# Patient Record
Sex: Female | Born: 1945 | Race: White | Hispanic: No | Smoking: Former smoker
Health system: Southern US, Community
[De-identification: ages and names within clinical notes are randomized; demographics above are authoritative.]

## PROBLEM LIST (undated history)

## (undated) DIAGNOSIS — C801 Malignant (primary) neoplasm, unspecified: Secondary | ICD-10-CM

## (undated) DIAGNOSIS — D0510 Intraductal carcinoma in situ of unspecified breast: Secondary | ICD-10-CM

## (undated) DIAGNOSIS — F329 Major depressive disorder, single episode, unspecified: Secondary | ICD-10-CM

## (undated) DIAGNOSIS — M8580 Other specified disorders of bone density and structure, unspecified site: Secondary | ICD-10-CM

## (undated) DIAGNOSIS — Z923 Personal history of irradiation: Secondary | ICD-10-CM

## (undated) DIAGNOSIS — H269 Unspecified cataract: Secondary | ICD-10-CM

## (undated) DIAGNOSIS — I10 Essential (primary) hypertension: Secondary | ICD-10-CM

## (undated) DIAGNOSIS — IMO0001 Reserved for inherently not codable concepts without codable children: Secondary | ICD-10-CM

## (undated) DIAGNOSIS — T7840XA Allergy, unspecified, initial encounter: Secondary | ICD-10-CM

## (undated) DIAGNOSIS — C50919 Malignant neoplasm of unspecified site of unspecified female breast: Secondary | ICD-10-CM

## (undated) DIAGNOSIS — R011 Cardiac murmur, unspecified: Secondary | ICD-10-CM

## (undated) DIAGNOSIS — F419 Anxiety disorder, unspecified: Secondary | ICD-10-CM

## (undated) DIAGNOSIS — F32A Depression, unspecified: Secondary | ICD-10-CM

## (undated) DIAGNOSIS — E785 Hyperlipidemia, unspecified: Secondary | ICD-10-CM

## (undated) HISTORY — DX: Intraductal carcinoma in situ of unspecified breast: D05.10

## (undated) HISTORY — DX: Allergy, unspecified, initial encounter: T78.40XA

## (undated) HISTORY — DX: Essential (primary) hypertension: I10

## (undated) HISTORY — DX: Hyperlipidemia, unspecified: E78.5

## (undated) HISTORY — PX: EYE SURGERY: SHX253

## (undated) HISTORY — DX: Unspecified cataract: H26.9

## (undated) HISTORY — DX: Malignant neoplasm of unspecified site of unspecified female breast: C50.919

## (undated) HISTORY — DX: Other specified disorders of bone density and structure, unspecified site: M85.80

---

## 1994-12-17 DIAGNOSIS — C50919 Malignant neoplasm of unspecified site of unspecified female breast: Secondary | ICD-10-CM

## 1994-12-17 HISTORY — DX: Malignant neoplasm of unspecified site of unspecified female breast: C50.919

## 1996-12-17 HISTORY — PX: BREAST SURGERY: SHX581

## 2001-01-07 ENCOUNTER — Other Ambulatory Visit: Admission: RE | Admit: 2001-01-07 | Discharge: 2001-01-07 | Payer: Self-pay | Admitting: Family Medicine

## 2002-01-17 HISTORY — PX: BREAST EXCISIONAL BIOPSY: SUR124

## 2002-01-28 ENCOUNTER — Ambulatory Visit (HOSPITAL_COMMUNITY): Admission: RE | Admit: 2002-01-28 | Discharge: 2002-01-28 | Payer: Self-pay | Admitting: Family Medicine

## 2002-01-28 ENCOUNTER — Encounter: Payer: Self-pay | Admitting: Family Medicine

## 2002-02-10 ENCOUNTER — Encounter: Admission: RE | Admit: 2002-02-10 | Discharge: 2002-02-10 | Payer: Self-pay | Admitting: Family Medicine

## 2002-02-10 ENCOUNTER — Encounter: Payer: Self-pay | Admitting: Family Medicine

## 2002-02-17 ENCOUNTER — Other Ambulatory Visit: Admission: RE | Admit: 2002-02-17 | Discharge: 2002-02-17 | Payer: Self-pay | Admitting: Family Medicine

## 2002-03-03 LAB — FECAL OCCULT BLOOD, GUAIAC: Fecal Occult Blood: NEGATIVE

## 2003-02-16 ENCOUNTER — Encounter: Admission: RE | Admit: 2003-02-16 | Discharge: 2003-02-16 | Payer: Self-pay | Admitting: Family Medicine

## 2003-02-16 ENCOUNTER — Encounter: Payer: Self-pay | Admitting: Family Medicine

## 2003-03-02 ENCOUNTER — Other Ambulatory Visit: Admission: RE | Admit: 2003-03-02 | Discharge: 2003-03-02 | Payer: Self-pay | Admitting: Family Medicine

## 2003-03-18 LAB — HM COLONOSCOPY

## 2004-03-07 ENCOUNTER — Encounter: Admission: RE | Admit: 2004-03-07 | Discharge: 2004-03-07 | Payer: Self-pay | Admitting: Family Medicine

## 2004-03-24 ENCOUNTER — Encounter: Payer: Self-pay | Admitting: Family Medicine

## 2004-03-24 ENCOUNTER — Other Ambulatory Visit: Admission: RE | Admit: 2004-03-24 | Discharge: 2004-03-24 | Payer: Self-pay | Admitting: Family Medicine

## 2004-03-24 LAB — CONVERTED CEMR LAB: Pap Smear: NORMAL

## 2004-10-26 ENCOUNTER — Ambulatory Visit: Payer: Self-pay | Admitting: Family Medicine

## 2005-04-09 ENCOUNTER — Ambulatory Visit: Payer: Self-pay | Admitting: Family Medicine

## 2005-05-02 ENCOUNTER — Encounter: Admission: RE | Admit: 2005-05-02 | Discharge: 2005-05-02 | Payer: Self-pay | Admitting: Family Medicine

## 2005-05-02 ENCOUNTER — Ambulatory Visit: Payer: Self-pay | Admitting: Family Medicine

## 2005-07-09 ENCOUNTER — Ambulatory Visit: Payer: Self-pay | Admitting: Family Medicine

## 2006-07-31 ENCOUNTER — Encounter: Admission: RE | Admit: 2006-07-31 | Discharge: 2006-07-31 | Payer: Self-pay | Admitting: Family Medicine

## 2006-09-09 ENCOUNTER — Ambulatory Visit: Payer: Self-pay | Admitting: Family Medicine

## 2007-08-11 ENCOUNTER — Encounter: Admission: RE | Admit: 2007-08-11 | Discharge: 2007-08-11 | Payer: Self-pay | Admitting: Family Medicine

## 2007-08-13 ENCOUNTER — Encounter (INDEPENDENT_AMBULATORY_CARE_PROVIDER_SITE_OTHER): Payer: Self-pay | Admitting: *Deleted

## 2007-09-29 ENCOUNTER — Encounter: Payer: Self-pay | Admitting: Family Medicine

## 2007-09-29 DIAGNOSIS — N6019 Diffuse cystic mastopathy of unspecified breast: Secondary | ICD-10-CM | POA: Insufficient documentation

## 2007-09-29 DIAGNOSIS — E785 Hyperlipidemia, unspecified: Secondary | ICD-10-CM | POA: Insufficient documentation

## 2007-09-29 DIAGNOSIS — I059 Rheumatic mitral valve disease, unspecified: Secondary | ICD-10-CM | POA: Insufficient documentation

## 2007-09-29 DIAGNOSIS — T7840XA Allergy, unspecified, initial encounter: Secondary | ICD-10-CM | POA: Insufficient documentation

## 2007-10-21 ENCOUNTER — Ambulatory Visit: Payer: Self-pay | Admitting: Family Medicine

## 2007-10-21 ENCOUNTER — Other Ambulatory Visit: Admission: RE | Admit: 2007-10-21 | Discharge: 2007-10-21 | Payer: Self-pay | Admitting: Family Medicine

## 2007-10-21 ENCOUNTER — Encounter: Payer: Self-pay | Admitting: Family Medicine

## 2007-10-21 DIAGNOSIS — Z87891 Personal history of nicotine dependence: Secondary | ICD-10-CM | POA: Insufficient documentation

## 2007-10-27 ENCOUNTER — Encounter (INDEPENDENT_AMBULATORY_CARE_PROVIDER_SITE_OTHER): Payer: Self-pay | Admitting: *Deleted

## 2007-10-27 LAB — CONVERTED CEMR LAB
Pap Smear: NORMAL
Pap Smear: NORMAL

## 2007-11-05 ENCOUNTER — Encounter (INDEPENDENT_AMBULATORY_CARE_PROVIDER_SITE_OTHER): Payer: Self-pay | Admitting: *Deleted

## 2008-08-25 ENCOUNTER — Encounter: Admission: RE | Admit: 2008-08-25 | Discharge: 2008-08-25 | Payer: Self-pay | Admitting: Family Medicine

## 2008-11-23 ENCOUNTER — Encounter: Payer: Self-pay | Admitting: Family Medicine

## 2008-11-23 ENCOUNTER — Ambulatory Visit: Payer: Self-pay | Admitting: Family Medicine

## 2008-11-23 ENCOUNTER — Other Ambulatory Visit: Admission: RE | Admit: 2008-11-23 | Discharge: 2008-11-23 | Payer: Self-pay | Admitting: Family Medicine

## 2008-11-23 DIAGNOSIS — M8580 Other specified disorders of bone density and structure, unspecified site: Secondary | ICD-10-CM

## 2008-11-26 LAB — CONVERTED CEMR LAB
ALT: 16 units/L (ref 0–35)
AST: 22 units/L (ref 0–37)
CO2: 32 meq/L (ref 19–32)
Cholesterol: 220 mg/dL (ref 0–200)
Eosinophils Relative: 2.1 % (ref 0.0–5.0)
Glucose, Bld: 116 mg/dL — ABNORMAL HIGH (ref 70–99)
Hemoglobin: 14.3 g/dL (ref 12.0–15.0)
Lymphocytes Relative: 17.7 % (ref 12.0–46.0)
MCHC: 34.4 g/dL (ref 30.0–36.0)
Monocytes Relative: 7.2 % (ref 3.0–12.0)
Neutrophils Relative %: 72.8 % (ref 43.0–77.0)
Platelets: 213 10*3/uL (ref 150–400)
TSH: 0.96 microintl units/mL (ref 0.35–5.50)
Total CHOL/HDL Ratio: 3.4
Triglycerides: 84 mg/dL (ref 0–149)
VLDL: 17 mg/dL (ref 0–40)

## 2008-11-29 ENCOUNTER — Encounter (INDEPENDENT_AMBULATORY_CARE_PROVIDER_SITE_OTHER): Payer: Self-pay | Admitting: *Deleted

## 2009-09-01 ENCOUNTER — Encounter: Admission: RE | Admit: 2009-09-01 | Discharge: 2009-09-01 | Payer: Self-pay | Admitting: Family Medicine

## 2009-09-14 ENCOUNTER — Ambulatory Visit: Payer: Self-pay | Admitting: Family Medicine

## 2009-09-14 LAB — CONVERTED CEMR LAB
Bilirubin Urine: NEGATIVE
Epithelial cells, urine: 1 /lpf
Glucose, Urine, Semiquant: NEGATIVE
Protein, U semiquant: NEGATIVE
Urine crystals, microscopic: 0 /hpf
Yeast, UA: 0
pH: 8.5

## 2009-09-28 ENCOUNTER — Ambulatory Visit: Payer: Self-pay | Admitting: Family Medicine

## 2009-09-28 LAB — CONVERTED CEMR LAB
Bilirubin Urine: NEGATIVE
Blood in Urine, dipstick: NEGATIVE
Ketones, urine, test strip: NEGATIVE
Specific Gravity, Urine: <1.005
WBC Urine, dipstick: NEGATIVE
pH: 7.5

## 2009-11-24 ENCOUNTER — Ambulatory Visit: Payer: Self-pay | Admitting: Family Medicine

## 2009-11-25 LAB — CONVERTED CEMR LAB
ALT: 22 units/L (ref 0–35)
Alkaline Phosphatase: 61 units/L (ref 39–117)
BUN: 10 mg/dL (ref 6–23)
Cholesterol: 231 mg/dL — ABNORMAL HIGH (ref 0–200)
Creatinine, Ser: 0.8 mg/dL (ref 0.4–1.2)
Eosinophils Absolute: 0.1 10*3/uL (ref 0.0–0.7)
Eosinophils Relative: 0.7 % (ref 0.0–5.0)
Glucose, Bld: 99 mg/dL (ref 70–99)
HCT: 40.6 % (ref 36.0–46.0)
Lymphocytes Relative: 11.4 % — ABNORMAL LOW (ref 12.0–46.0)
Neutro Abs: 7.3 10*3/uL (ref 1.4–7.7)
RBC: 4 M/uL (ref 3.87–5.11)
RDW: 12.6 % (ref 11.5–14.6)
Sodium: 141 meq/L (ref 135–145)
Total CHOL/HDL Ratio: 4
Total Protein: 7.2 g/dL (ref 6.0–8.3)
Triglycerides: 79 mg/dL (ref 0.0–149.0)
VLDL: 15.8 mg/dL (ref 0.0–40.0)
Vit D, 25-Hydroxy: 32 ng/mL (ref 30–89)

## 2009-12-13 ENCOUNTER — Ambulatory Visit: Payer: Self-pay | Admitting: Family Medicine

## 2010-01-26 ENCOUNTER — Ambulatory Visit: Payer: Self-pay | Admitting: Family Medicine

## 2010-02-01 ENCOUNTER — Ambulatory Visit: Payer: Self-pay | Admitting: Family Medicine

## 2010-02-01 DIAGNOSIS — I10 Essential (primary) hypertension: Secondary | ICD-10-CM | POA: Insufficient documentation

## 2010-03-01 ENCOUNTER — Ambulatory Visit: Payer: Self-pay | Admitting: Family Medicine

## 2010-03-02 LAB — CONVERTED CEMR LAB
Albumin: 4 g/dL (ref 3.5–5.2)
BUN: 12 mg/dL (ref 6–23)
CO2: 32 meq/L (ref 19–32)
Chloride: 108 meq/L (ref 96–112)
GFR calc non Af Amer: 77.36 mL/min (ref >60)
Glucose, Bld: 95 mg/dL (ref 70–99)
Potassium: 4.9 meq/L (ref 3.5–5.1)

## 2010-09-06 ENCOUNTER — Encounter: Admission: RE | Admit: 2010-09-06 | Discharge: 2010-09-06 | Payer: Self-pay | Admitting: Family Medicine

## 2010-09-06 LAB — HM MAMMOGRAPHY: HM Mammogram: NORMAL

## 2010-09-08 ENCOUNTER — Encounter (INDEPENDENT_AMBULATORY_CARE_PROVIDER_SITE_OTHER): Payer: Self-pay | Admitting: *Deleted

## 2010-09-08 ENCOUNTER — Encounter: Payer: Self-pay | Admitting: Family Medicine

## 2010-11-22 ENCOUNTER — Telehealth (INDEPENDENT_AMBULATORY_CARE_PROVIDER_SITE_OTHER): Payer: Self-pay | Admitting: *Deleted

## 2010-11-28 ENCOUNTER — Ambulatory Visit: Payer: Self-pay | Admitting: Family Medicine

## 2010-11-28 ENCOUNTER — Encounter: Payer: Self-pay | Admitting: Family Medicine

## 2010-12-01 LAB — CONVERTED CEMR LAB
ALT: 15 units/L (ref 0–35)
BUN: 15 mg/dL (ref 6–23)
Basophils Absolute: 0 10*3/uL (ref 0.0–0.1)
CO2: 30 meq/L (ref 19–32)
Chloride: 104 meq/L (ref 96–112)
Cholesterol: 211 mg/dL — ABNORMAL HIGH (ref 0–200)
Creatinine, Ser: 0.7 mg/dL (ref 0.4–1.2)
Eosinophils Absolute: 0.2 10*3/uL (ref 0.0–0.7)
Eosinophils Relative: 3.1 % (ref 0.0–5.0)
Glucose, Bld: 98 mg/dL (ref 70–99)
HCT: 40.1 % (ref 36.0–46.0)
Lymphocytes Relative: 19.2 % (ref 12.0–46.0)
MCHC: 34.3 g/dL (ref 30.0–36.0)
MCV: 101.6 fL — ABNORMAL HIGH (ref 78.0–100.0)
Platelets: 210 10*3/uL (ref 150.0–400.0)
Potassium: 4.1 meq/L (ref 3.5–5.1)
Sodium: 140 meq/L (ref 135–145)
Total Protein: 6.3 g/dL (ref 6.0–8.3)
Triglycerides: 44 mg/dL (ref 0.0–149.0)
Vit D, 25-Hydroxy: 39 ng/mL (ref 30–89)
WBC: 6.1 10*3/uL (ref 4.5–10.5)

## 2010-12-06 ENCOUNTER — Ambulatory Visit: Payer: Self-pay | Admitting: Family Medicine

## 2010-12-14 ENCOUNTER — Encounter
Admission: RE | Admit: 2010-12-14 | Discharge: 2010-12-14 | Payer: Self-pay | Source: Home / Self Care | Attending: Family Medicine | Admitting: Family Medicine

## 2011-01-16 NOTE — Assessment & Plan Note (Signed)
Summary: ROA 1 MTHS CYD   Vital Signs:  Patient profile:   65 year old female Height:      65.25 inches Weight:      155.50 pounds BMI:     25.77 Temp:     98.2 degrees F oral Pulse rate:   76 / minute Pulse rhythm:   regular BP sitting:   120 / 80  (left arm) Cuff size:   regular  Vitals Entered By: Linde Gillis CMA Duncan Dull) (March 01, 2010 8:57 AM) CC: one month follow up   History of Present Illness: here for f/u of HTN   started on hctz last visit  bp excellent today 120/80 due for labs   has felt ok   med is working great - no trouble with it at all does urinate a bit more   no change in smoking and not motivated to quit understands risks   Allergies: 1)  Fosamax 2)  Lipitor 3)  Sudafed 4)  Zoloft  Past History:  Past Medical History: Last updated: 02/01/2010 Hyperlipidemia osteopenia smoker fam hx of ovarian ca  HTN  Past Surgical History: Last updated: 09/29/2007 Breast biopsy- DCIS (1998) Dexa-n osteopenia (12/2000) Breast biopsy- neg (01/2002) Colonoscopy- hemorrhoids, ext (03/2003) Dexa- osteopenia femoral neck, T -1.86 (03/2003)  Family History: Last updated: 02/01/2010 Father: Lymphoma, MI, HTN  Mother:deceased from ovarian ca age 7 Siblings: brother- ETOH M aunt breast ca  Social History: Last updated: 11/23/2008 Marital Status: widowed- lost husb to cancer Children: 2 Occupation: part time at [employer] smoker walks for exercise   Risk Factors: Smoking Status: current (09/29/2007)  Review of Systems General:  Denies fatigue, loss of appetite, and malaise. Eyes:  Denies blurring and eye pain. CV:  Denies chest pain or discomfort, palpitations, and shortness of breath with exertion. Resp:  Denies cough, shortness of breath, and wheezing. GI:  Denies abdominal pain, bloody stools, and change in bowel habits. GU:  Denies urinary frequency. MS:  Denies muscle aches and muscle weakness. Derm:  Denies poor wound  healing and rash. Neuro:  Denies headaches, numbness, and tingling. Endo:  Denies cold intolerance and heat intolerance. Heme:  Denies abnormal bruising and bleeding.  Physical Exam  General:  Well-developed,well-nourished,in no acute distress; alert,appropriate and cooperative throughout examination Head:  normocephalic, atraumatic, and no abnormalities observed.   Eyes:  vision grossly intact, pupils equal, pupils round, and pupils reactive to light.   Mouth:  pharynx pink and moist.   Neck:  supple with full rom and no masses or thyromegally, no JVD or carotid bruit  Lungs:  diffusely distant bs - with good air exch  Heart:  Normal rate and regular rhythm. S1 and S2 normal without gallop, murmur, click, rub or other extra sounds. Msk:  No deformity or scoliosis noted of thoracic or lumbar spine.   Pulses:  R and L carotid,radial,femoral,dorsalis pedis and posterior tibial pulses are full and equal bilaterally Extremities:  No clubbing, cyanosis, edema, or deformity noted with normal full range of motion of all joints.   Neurologic:  sensation intact to light touch, gait normal, and DTRs symmetrical and normal.   Skin:  Intact without suspicious lesions or rashes Cervical Nodes:  No lymphadenopathy noted Psych:  normal affect, talkative and pleasant    Impression & Recommendations:  Problem # 1:  HYPERTENSION, BENIGN ESSENTIAL (ICD-401.1) Assessment Improved improved with hctz and no problems  disc lifestyle - still rec smoking cessation lab today f/u 6 mo  Her updated  medication list for this problem includes:    Hydrochlorothiazide 25 Mg Tabs (Hydrochlorothiazide) .Marland Kitchen... 1 by mouth once daily in am  Orders: Venipuncture (40981) TLB-Renal Function Panel (80069-RENAL)  Complete Medication List: 1)  Flonase 50 Mcg/act Susp (Fluticasone propionate) .... 2 sprays in each nostril once daily as needed 2)  Lexapro 10 Mg Tabs (Escitalopram oxalate) .Marland Kitchen.. 1 by mouth once daily 3)   Multi-vitamin Tabs (Multiple vitamin) .... Take by mouth daily as directed 4)  Vitamin C 1000 Mg Tabs (Ascorbic acid) .... Take one by mouth daily 5)  Calcium 1500 Mg Tabs (Calcium carbonate) .... Take by mouth daily as directed 6)  Aspirin 81 Mg Tbec (Aspirin) .... Take one by mouth daily 7)  Fish Oil Oil (Fish oil) .... Takes one tab twice a day 8)  Flax Seed Oil 1000 Mg Caps (Flaxseed (linseed)) .... One capsule daily 9)  Hydrochlorothiazide 25 Mg Tabs (Hydrochlorothiazide) .Marland Kitchen.. 1 by mouth once daily in am  Patient Instructions: 1)  follow up in about 6 months 2)  no change in medicines  3)  lab today   Current Allergies (reviewed today): FOSAMAX LIPITOR SUDAFED ZOLOFT

## 2011-01-16 NOTE — Progress Notes (Signed)
----   Converted from flag ---- ---- 11/21/2010 8:31 PM, Colon Flattery Tower MD wrote: please check lipid and wellness and vit D for v70.0 and 272 and 733.0 thanks   ---- 11/21/2010 12:27 PM, Liane Comber CMA (AAMA) wrote: Lab orders please! Good Morning! This pt is scheduled for cpx labs Tuesday, which labs to draw and dx codes to use? Thanks Tasha ------------------------------

## 2011-01-16 NOTE — Letter (Signed)
Summary: Results Follow up Letter   at [facility]  [address] [place], [place] [address]   Phone: [phone]  Fax: [phone]    09/08/2010 MRN: [id number]  CATHLENE GARDELLA [address] [place], [place]  [address]  Dear Ms. Mcmahill,  The following are the results of your recent test(s):  Test         Result    Pap Smear:        Normal _____  Not Normal _____ Comments: ______________________________________________________ Cholesterol: LDL(Bad cholesterol):         Your goal is less than:         HDL (Good cholesterol):       Your goal is more than: Comments:  ______________________________________________________ Mammogram:        Normal _x____  Not Normal _____ Comments: repeat in one year  ___________________________________________________________________ Hemoccult:        Normal _____  Not normal _______ Comments:    _____________________________________________________________________ Other Tests:    We routinely do not discuss normal results over the telephone.  If you desire a copy of the results, or you have any questions about this information we can discuss them at your next office visit.   Sincerely,  Roxy Manns, MD

## 2011-01-16 NOTE — Letter (Signed)
Summary: Results Follow up Letter  [place] at [facility]  [address] [place], [place] [address]   Phone: [phone]  Fax: [phone]    09/08/2010 MRN: [id number]    IVEE POELLNITZ [address] [place], [place]  [address]    Dear Ms. Riggsbee,  The following are the results of your recent test(s):  Test         Result    Pap Smear:        Normal _____  Not Normal _____ Comments: ______________________________________________________ Cholesterol: LDL(Bad cholesterol):         Your goal is less than:         HDL (Good cholesterol):       Your goal is more than: Comments:  ______________________________________________________ Mammogram:        Normal __X___  Not Normal _____ Comments:Repeat in one year.   ___________________________________________________________________ Hemoccult:        Normal _____  Not normal _______ Comments:    _____________________________________________________________________ Other Tests:    We routinely do not discuss normal results over the telephone.  If you desire a copy of the results, or you have any questions about this information we can discuss them at your next office visit.   Sincerely,    Idamae Schuller Tower,MD  MT/ri

## 2011-01-16 NOTE — Assessment & Plan Note (Signed)
Summary: BP high at Nurse Visit, MAT advised OV Salley Scarlet   Vital Signs:  Patient profile:   65 year old female Height:      65.25 inches Weight:      156.75 pounds BMI:     25.98 Temp:     98.4 degrees F oral Pulse rate:   72 / minute Pulse rhythm:   regular BP sitting:   146 / 80  (left arm) Cuff size:   regular  Vitals Entered By: Lewanda Rife LPN (February 01, 2010 2:11 PM)  CC:  BP high at nurse visit.  History of Present Illness: here for f/ u of likely new onset essential HTN   bp is 146/80 today  has been high ast last 2 office visits and bp check with nurse was 160/80 last week  had a bout of strep throat -- and better now   father had high bp in late life  no symptoms at all - no headache or flushing  tries not to eat salt - or low sodium options-- and does not eat out a lot   is exercising - and does walk with her friends and goes to the gym  does tend to swell and retain fluid  does not urinate a lot   is still smoking about the same- no plan to quit-- and is not ready to quit at this time even though she knows the risks  she tends to not smoke a whole cigaratte   cholesterol is high LDL 150s- really tries to watch fats in diet  she has had joint pain with statins in the past and refuses   is interested in shingles vaccine     Allergies: 1)  Fosamax 2)  Lipitor 3)  Sudafed 4)  Zoloft  Past History:  Past Surgical History: Last updated: 09/29/2007 Breast biopsy- DCIS (1998) Dexa-n osteopenia (12/2000) Breast biopsy- neg (01/2002) Colonoscopy- hemorrhoids, ext (03/2003) Dexa- osteopenia femoral neck, T -1.86 (03/2003)  Family History: Last updated: 02/01/2010 Father: Lymphoma, MI, HTN  Mother:deceased from ovarian ca age 1 Siblings: brother- ETOH M aunt breast ca  Social History: Last updated: 11/23/2008 Marital Status: widowed- lost husb to cancer Children: 2 Occupation: part time at [employer] smoker walks for exercise    Risk Factors: Smoking Status: current (09/29/2007)  Past Medical History: Hyperlipidemia osteopenia smoker fam hx of ovarian ca  HTN  Family History: Father: Lymphoma, MI, HTN  Mother:deceased from ovarian ca age 33 Siblings: brother- ETOH M aunt breast ca  Review of Systems General:  Denies fatigue, loss of appetite, malaise, and sleep disorder. Eyes:  Denies blurring and eye pain. CV:  Denies chest pain or discomfort, lightheadness, palpitations, and shortness of breath with exertion. Resp:  Denies cough and wheezing. GI:  Denies abdominal pain, bloody stools, change in bowel habits, and indigestion. GU:  Denies abnormal vaginal bleeding, discharge, and dysuria. MS:  Denies joint pain, joint redness, and joint swelling. Derm:  Denies itching, lesion(s), poor wound healing, and rash. Neuro:  Denies numbness and tingling. Psych:  mood is fairly stable lately. Endo:  Denies cold intolerance, excessive thirst, excessive urination, and heat intolerance. Heme:  Denies abnormal bruising and bleeding.  Physical Exam  General:  alert, well-developed, well-nourished, and well-hydrated.  NAD Head:  normocephalic, atraumatic, and no abnormalities observed.   Eyes:  vision grossly intact, pupils equal, pupils round, and pupils reactive to light.   Neck:  supple with full rom and no masses or thyromegally, no JVD  or carotid bruit  Lungs:  diffusely distant bs - with good air exch  Heart:  Normal rate and regular rhythm. S1 and S2 normal without gallop, murmur, click, rub or other extra sounds. Abdomen:  Bowel sounds positive,abdomen soft and non-tender without masses, organomegaly or hernias noted. no renal bruits  Msk:  No deformity or scoliosis noted of thoracic or lumbar spine.   Pulses:  R and L carotid,radial,femoral,dorsalis pedis and posterior tibial pulses are full and equal bilaterally Extremities:  No clubbing, cyanosis, edema, or deformity noted with normal full range of  motion of all joints.   Neurologic:  sensation intact to light touch, gait normal, and DTRs symmetrical and normal.   Skin:  Intact without suspicious lesions or rashes Cervical Nodes:  No lymphadenopathy noted Inguinal Nodes:  No significant adenopathy Psych:  normal affect, talkative and pleasant    Impression & Recommendations:  Problem # 1:  HYPERTENSION, BENIGN ESSENTIAL (ICD-401.1) Assessment New with family hx and personal hx of tab abuse and high chol disc healthy diet (low simple sugar/ choose complex carbs/ low sat fat) diet and exercise in detail -- as well as watching sodium start hctz 25  update if side eff or probs  f/u 1 mo for visit and check lytes Her updated medication list for this problem includes:    Hydrochlorothiazide 25 Mg Tabs (Hydrochlorothiazide) .Marland Kitchen... 1 by mouth once daily in am  Problem # 2:  TOBACCO USE (ICD-305.1) Assessment: Unchanged discussed in detail risks of smoking, and possible outcomes including COPD, vascular dz, cancer and also respiratory infections/sinus problems  pt voiced understanding and is not ready to quit  Complete Medication List: 1)  Flonase 50 Mcg/act Susp (Fluticasone propionate) .... 2 sprays in each nostril once daily as needed 2)  Lexapro 10 Mg Tabs (Escitalopram oxalate) .Marland Kitchen.. 1 by mouth once daily 3)  Multi-vitamin Tabs (Multiple vitamin) .... Take by mouth daily as directed 4)  Vitamin C 1000 Mg Tabs (Ascorbic acid) .... Take one by mouth daily 5)  Calcium 1500 Mg Tabs (Calcium carbonate) .... Take by mouth daily as directed 6)  Aspirin 81 Mg Tbec (Aspirin) .... Take one by mouth daily 7)  Fish Oil Oil (Fish oil) .... Takes one tab twice a day 8)  Flax Seed Oil 1000 Mg Caps (Flaxseed (linseed)) .... One capsule daily 9)  Hydrochlorothiazide 25 Mg Tabs (Hydrochlorothiazide) .Marland Kitchen.. 1 by mouth once daily in am  Patient Instructions: 1)  take hctz 1 pill daily in am  2)  it will make you urinate more after you take it  3)   keep up a  good fluid intake 4)  watch salt in diet  5)  keep up good exercise 6)  keep thinking about quitting smoking  7)  follow up with me in about one month - for visit and labs - you do not need to fast Prescriptions: HYDROCHLOROTHIAZIDE 25 MG TABS (HYDROCHLOROTHIAZIDE) 1 by mouth once daily in am  #90 x 3   Entered and Authorized by:   Judith Part MD   Signed by:   Judith Part MD on 02/01/2010   Method used:   Print then Give to Patient   RxID:   [id number]   Current Allergies (reviewed today): FOSAMAX LIPITOR SUDAFED ZOLOFT

## 2011-01-16 NOTE — Letter (Signed)
Summary: Results Follow up Letter  [place] at [facility]  [address] [place], [place] [address]   Phone: [phone]  Fax: [phone]    09/08/2010 MRN: [id number]  ARIZONA SORN [address] [place], [place]  [address]  Dear Ms. Suits,  The following are the results of your recent test(s):  Test         Result    Pap Smear:        Normal _____  Not Normal _____ Comments: ______________________________________________________ Cholesterol: LDL(Bad cholesterol):         Your goal is less than:         HDL (Good cholesterol):       Your goal is more than: Comments:  ______________________________________________________ Mammogram:        Normal _____  Not Normal _____ Comments:  ___________________________________________________________________ Hemoccult:        Normal _____  Not normal _______ Comments:    _____________________________________________________________________ Other Tests:    We routinely do not discuss normal results over the telephone.  If you desire a copy of the results, or you have any questions about this information we can discuss them at your next office visit.   Sincerely,

## 2011-01-16 NOTE — Assessment & Plan Note (Signed)
Summary: BP CHECK / LFW  Nurse Visit   Vital Signs:  Patient profile:   65 year old female Height:      65.25 inches Temp:     98.2 degrees F oral Pulse rate:   60 / minute Pulse rhythm:   regular BP sitting:   160 / 80  (left arm) Cuff size:   regular  Vitals Entered By: Linde Gillis CMA Duncan Dull) (January 26, 2010 9:17 AM) CC: BP check Comments blood pressure is still high - so please adv pt to f/u with me when able for new HTN Left message on voicemail  to return call. Delilah Shan CMA Duncan Dull)  January 27, 2010 8:39 AM  Appointment scheduled  02/01/2010 at 2 p.m. Lugene Fuquay CMA (AAMA)  January 27, 2010 8:43 AM   Allergies: 1)  Fosamax 2)  Lipitor 3)  Sudafed 4)  Zoloft  Current Allergies (reviewed today): FOSAMAX LIPITOR SUDAFED ZOLOFT

## 2011-01-18 NOTE — Assessment & Plan Note (Signed)
Summary: CPX,ZOSTAVAX/CLE   Vital Signs:  Patient profile:   65 year old female Height:      65.5 inches Weight:      160.50 pounds BMI:     26.40 Temp:     97.9 degrees F oral Pulse rate:   80 / minute Pulse rhythm:   regular BP sitting:   126 / 78  (left arm) Cuff size:   regular  Vitals Entered By: Lewanda Rife LPN (December 06, 2010 10:27 AM) CC: CPX LMP [id number]   History of Present Illness: here for wellness exam and to review chronic med problems   is feeling good  no new medical problems   wt is up 5 lb at bmi of 26    bp 126/78- in good control on current meds  pap nl 12/09 had bimanual exam nl 2010 fam hx of ov cancer-- her mother   mam 9/11 nl  self exam -- no lumps or changes   colonosc 04- due in 10 y  smoking status-- is cutting down and trying the electric cigarette  is really helping  is down to less than 1ppd  no quit date - not quite ready   dexa- osteopenia 04  (non tol fosamax in past )  due for check ca and D-- taking that  D level is 39  tdap 9/10 ptx 2010 flu shot -- [facility] z[facility]avax-- will do today  lipids are better with LDL 128 down from 152 is working on her diet and walks very regularly  stays very active     Allergies: 1)  Fosamax 2)  Lipitor 3)  Sudafed 4)  Zoloft  Review of Systems General:  Denies fatigue, loss of appetite, and malaise. Eyes:  Denies blurring and eye irritation. CV:  Denies chest pain or discomfort, palpitations, and shortness of breath with exertion. Resp:  Denies cough, pleuritic, and wheezing. GI:  Denies abdominal pain, bloody stools, change in bowel habits, indigestion, and nausea. GU:  Denies abnormal vaginal bleeding, discharge, dysuria, and urinary frequency. MS:  Denies muscle aches and cramps. Derm:  Denies itching, lesion(s), poor wound healing, and rash. Neuro:  Denies numbness and tingling. Psych:  mood is overall stable-- improving with time. Endo:  Denies cold  intolerance, excessive thirst, excessive urination, and heat intolerance. Heme:  Denies abnormal bruising and bleeding.  Physical Exam  General:  Well-developed,well-nourished,in no acute distress; alert,appropriate and cooperative throughout examination Head:  normocephalic, atraumatic, and no abnormalities observed.   Eyes:  vision grossly intact, pupils equal, pupils round, and pupils reactive to light.  no conjunctival pallor, injection or icterus  Ears:  R ear normal and L ear normal.   Nose:  no nasal discharge.   Mouth:  pharynx pink and moist.   Neck:  supple with full rom and no masses or thyromegally, no JVD or carotid bruit  Chest Wall:  No deformities, masses, or tenderness noted. Breasts:  No mass, nodules, thickening, tenderness, bulging, retraction, inflamation, nipple discharge or skin changes noted.   Lungs:  diffusely distant bs - with good air exch  no rales or wheeze Heart:  Normal rate and regular rhythm. S1 and S2 normal without gallop, murmur, click, rub or other extra sounds. Abdomen:  Bowel sounds positive,abdomen soft and non-tender without masses, organomegaly or hernias noted. no renal bruits  Genitalia:  normal introitus, no external lesions, no vaginal or cervical lesions, and no adnexal masses or tenderness.  bimanual exam only- no pap  done Msk:  No deformity or scoliosis noted of thoracic or lumbar spine.  no acute joint changes  Extremities:  No clubbing, cyanosis, edema, or deformity noted with normal full range of motion of all joints.   Neurologic:  sensation intact to light touch, gait normal, and DTRs symmetrical and normal.   Skin:  Intact without suspicious lesions or rashes lentigos diffusely  Cervical Nodes:  No lymphadenopathy noted Axillary Nodes:  No palpable lymphadenopathy Inguinal Nodes:  No significant adenopathy Psych:  normal affect, talkative and pleasant    Impression & Recommendations:  Problem # 1:  HEALTH MAINTENANCE EXAM  (ICD-V70.0) Assessment Comment Only reviewed health habits including diet, exercise and skin cancer prevention reviewed health maintenance list and family history rev wellness labs in detail zoster vaccine today smoking cessation is most impt to gen health  Problem # 2:  ROUTINE GYNECOLOGICAL EXAMINATION (ICD-V72.31) Assessment: Comment Only annual exam-- bimanual only fam hx of ov cancer  Problem # 3:  HYPERTENSION, BENIGN ESSENTIAL (ICD-401.1) Assessment: Unchanged  this is well controlled without change labs rev great exercise  med refilled  Her updated medication list for this problem includes:    Hydrochlorothiazide 25 Mg Tabs (Hydrochlorothiazide) .Marland Kitchen... 1 by mouth once daily in am  BP today: 126/78 Prior BP: 120/80 (03/01/2010)  Labs Reviewed: K+: 4.1 (11/28/2010) Creat: : 0.7 (11/28/2010)   Chol: 211 (11/28/2010)   HDL: 55.60 (11/28/2010)   LDL: DEL (11/23/2008)   TG: 44.0 (11/28/2010)  Problem # 4:  OSTEOPENIA (ICD-733.90) Assessment: Unchanged check dexa smoker with GI intol to fosamax (swallowing )  ? candidate for reclast rev vit D level  rev ca and Dintake and exercise Her updated medication list for this problem includes:    Calcium 1500 Mg Tabs (Calcium carbonate) .Marland Kitchen... Take by mouth daily as directed  Orders: Radiology Referral (Radiology)  Problem # 5:  HYPERLIPIDEMIA (ICD-272.4) Assessment: Improved  this is imp with better diet commended rev low sat fat diet  rev labs in detail   Labs Reviewed: SGOT: 22 (11/28/2010)   SGPT: 15 (11/28/2010)   HDL:55.60 (11/28/2010), 61.30 (11/24/2009)  LDL:DEL (11/23/2008)  Chol:211 (11/28/2010), 231 (11/24/2009)  Trig:44.0 (11/28/2010), 79.0 (11/24/2009)  Complete Medication List: 1)  Flonase 50 Mcg/act Susp (Fluticasone propionate) .... 2 sprays in each nostril once daily as needed 2)  Lexapro 10 Mg Tabs (Escitalopram oxalate) .Marland Kitchen.. 1 by mouth once daily 3)  Multi-vitamin Tabs (Multiple vitamin) .... Take by  mouth daily as directed 4)  Vitamin C 1000 Mg Tabs (Ascorbic acid) .... Take one by mouth daily 5)  Calcium 1500 Mg Tabs (Calcium carbonate) .... Take by mouth daily as directed 6)  Aspirin 81 Mg Tbec (Aspirin) .... Take one by mouth daily 7)  Fish Oil Oil (Fish oil) .... Takes one tab twice a day 8)  Flax Seed Oil 1000 Mg Caps (Flaxseed (linseed)) .... One capsule daily 9)  Hydrochlorothiazide 25 Mg Tabs (Hydrochlorothiazide) .Marland Kitchen.. 1 by mouth once daily in am 10)  Vitamin E 1000 Unit Caps (Vitamin e) .... Take 1 capsule by mouth once a day  Other Orders: Zoster (Shingles) Vaccine Live [phone]) Admin 1st Vaccine (19147)  Patient Instructions: 1)  shingles vaccine today 2)  we will schedule dexa at check out  3)  continue calcium and vit D  4)  keep working on good exercise 5)  keep working on quitting smoking ! Prescriptions: HYDROCHLOROTHIAZIDE 25 MG TABS (HYDROCHLOROTHIAZIDE) 1 by mouth once daily in am  #90 x 3  Entered and Authorized by:   Judith Part MD   Signed by:   Judith Part MD on 12/06/2010   Method used:   Electronically to        [id number] (retail)       [address]       [place], [place]  [address]       Ph: [phone] or [phone]       Fax: [phone]   RxID:   [id number] LEXAPRO 10 MG TABS (ESCITALOPRAM OXALATE) 1 by mouth once daily  #30 x 11   Entered and Authorized by:   Judith Part MD   Signed by:   Judith Part MD on 12/06/2010   Method used:   Electronically to        [id number] (retail)       [address]       [place], [place]  [address]       Ph: [phone] or [phone]       Fax: [phone]   RxID:   [id number] FLONASE 50 MCG/ACT  SUSP (FLUTICASONE PROPIONATE) 2 sprays in each nostril once daily as needed  #1 mdi x 11   Entered and Authorized by:   Judith Part MD   Signed by:   Judith Part MD on 12/06/2010   Method used:   [facility] to          [id number] ([facility]ail[facility]       [address]       [place], [place]  [address]       Ph: [phone] or [phone]       Fax: [phone]   RxID:   [id number]    Orders Added: 1)  Zoster (Shingles) Vaccine Live [90736] 2)  Admin 1st Vaccine [25053] 3)  Radiology Referral [Radiology] 4)  Est. Patient 40-64 years [99396]   Immunization History:  Influenza Immunization History:    Influenza:  fluvax 3+ (09/26/2010)  Immunizations Administered:  Zostavax # 0:    Vaccine Type: Zostavax    Site: left deltoid    Mfr: Merck    Dose: 0.5 ml    Route: [place]    Given by: Lewanda Rife LPN    Exp. Date: 08/04/2011    Lot #: [id number]    VIS given: 09/28/05 given December 06, 2010.   Immunization History:  Influenza Immunization History:    Influenza:  Fluvax 3+ (09/26/2010)  Immunizations Administered:  Zostavax # 0:    Vaccine Type: Zostavax    Site: left deltoid    Mfr: Merck    Dose: 0.5 ml    Route: [place]    Given by: Lewanda Rife LPN    Exp. Date: 08/04/2011    Lot #: [id number]    VIS given: 09/28/05 given December 06, 2010.  Current Allergies (reviewed today): FOSAMAX LIPITOR SUDAFED ZOLOFT      Prevention & Chronic Care Immunizations   Influenza vaccine: Fluvax 3+  (09/26/2010)    Tetanus booster: 09/14/2009: Tdap    Pneumococcal vaccine: Pneumovax  (11/24/2009)    H. zoster vaccine: 12/06/2010: Zostavax  Colorectal Screening   Hemoccult: Negative  (03/03/2002)    Colonoscopy: normal  (03/18/2003)   Colonoscopy due: 03/2013  Other Screening   Pap smear: NEGATIVE FOR INTRAEPITHELIAL LESIONS OR MALIGNANCY.  (11/23/2008)    Mammogram: normal  (09/06/2010)   Mammogram action/deferral: Screening mammogram in 1 year.     (08/25/2008)   Mammogram due: 09/2011    DXA bone density scan: abnormal  (03/18/2003)   Smoking status: current  (09/29/2007)  Lipids   Total Cholesterol: 211  (11/28/2010)   LDL: DEL   (11/23/2008)   LDL Direct: 128.6  (11/28/2010)   HDL: 55.60  (11/28/2010)   Triglycerides: 44.0  (11/28/2010)    SGOT (AST): 22  (11/28/2010)   SGPT (ALT): 15  (11/28/2010)   Alkaline phosphatase: 55  (11/28/2010)   Total bilirubin: 0.7  (11/28/2010)  Hypertension   Last Blood Pressure: 126 / 78  (12/06/2010)   Serum creatinine: 0.7  (11/28/2010)   Serum potassium 4.1  (11/28/2010)  Self-Management Support :    Hypertension self-management support: Not documented    Lipid self-management support: Not documented

## 2011-08-22 ENCOUNTER — Other Ambulatory Visit: Payer: Self-pay | Admitting: Family Medicine

## 2011-08-22 DIAGNOSIS — Z1231 Encounter for screening mammogram for malignant neoplasm of breast: Secondary | ICD-10-CM

## 2011-09-19 ENCOUNTER — Ambulatory Visit: Payer: Self-pay

## 2011-09-27 ENCOUNTER — Ambulatory Visit: Payer: Self-pay

## 2011-10-31 ENCOUNTER — Ambulatory Visit
Admission: RE | Admit: 2011-10-31 | Discharge: 2011-10-31 | Disposition: A | Discharge status: Home / Self Care | Payer: Managed Care, Other (non HMO) | Source: Ambulatory Visit | Attending: Family Medicine | Admitting: Family Medicine

## 2011-10-31 DIAGNOSIS — Z1231 Encounter for screening mammogram for malignant neoplasm of breast: Secondary | ICD-10-CM

## 2011-11-12 ENCOUNTER — Encounter: Payer: Self-pay | Admitting: *Deleted

## 2012-01-09 ENCOUNTER — Other Ambulatory Visit: Payer: Self-pay | Admitting: Family Medicine

## 2012-01-09 NOTE — Telephone Encounter (Signed)
OK to refill? Last OV 12/11 and no labs recently.

## 2012-01-09 NOTE — Telephone Encounter (Signed)
Will refill electronically She has appt coming up next month

## 2012-01-10 ENCOUNTER — Other Ambulatory Visit: Payer: Self-pay | Admitting: Family Medicine

## 2012-01-10 NOTE — Telephone Encounter (Signed)
This medication was refilled yesterday by Dr. Milinda Antis.

## 2012-01-11 ENCOUNTER — Other Ambulatory Visit: Payer: Self-pay | Admitting: Family Medicine

## 2012-01-30 ENCOUNTER — Encounter: Payer: Self-pay | Admitting: Family Medicine

## 2012-01-30 ENCOUNTER — Ambulatory Visit (INDEPENDENT_AMBULATORY_CARE_PROVIDER_SITE_OTHER): Payer: Managed Care, Other (non HMO) | Admitting: Family Medicine

## 2012-01-30 ENCOUNTER — Telehealth: Payer: Self-pay | Admitting: Family Medicine

## 2012-01-30 VITALS — BP 128/74 | HR 80 | Temp 98.1°F | Ht 65.25 in | Wt 163.8 lb

## 2012-01-30 DIAGNOSIS — E785 Hyperlipidemia, unspecified: Secondary | ICD-10-CM

## 2012-01-30 DIAGNOSIS — Z0000 Encounter for general adult medical examination without abnormal findings: Secondary | ICD-10-CM | POA: Insufficient documentation

## 2012-01-30 DIAGNOSIS — M899 Disorder of bone, unspecified: Secondary | ICD-10-CM

## 2012-01-30 DIAGNOSIS — M949 Disorder of cartilage, unspecified: Secondary | ICD-10-CM

## 2012-01-30 DIAGNOSIS — F17200 Nicotine dependence, unspecified, uncomplicated: Secondary | ICD-10-CM

## 2012-01-30 DIAGNOSIS — I10 Essential (primary) hypertension: Secondary | ICD-10-CM

## 2012-01-30 LAB — COMPREHENSIVE METABOLIC PANEL
ALT: 18 U/L (ref 0–35)
AST: 22 U/L (ref 0–37)
BUN: 17 mg/dL (ref 6–23)
Calcium: 10.2 mg/dL (ref 8.4–10.5)
Chloride: 104 mEq/L (ref 96–112)
GFR: 91.18 mL/min (ref >60.00)
Glucose, Bld: 79 mg/dL (ref 70–99)
Potassium: 3.8 mEq/L (ref 3.5–5.1)
Sodium: 140 mEq/L (ref 135–145)
Total Protein: 7.4 g/dL (ref 6.0–8.3)

## 2012-01-30 LAB — CBC WITH DIFFERENTIAL/PLATELET
Basophils Relative: 0.3 % (ref 0.0–3.0)
Eosinophils Absolute: 0.1 10*3/uL (ref 0.0–0.7)
Lymphocytes Relative: 15.2 % (ref 12.0–46.0)
Lymphs Abs: 1.5 10*3/uL (ref 0.7–4.0)
MCV: 100.8 fl — ABNORMAL HIGH (ref 78.0–100.0)
Monocytes Relative: 6.2 % (ref 3.0–12.0)
Neutro Abs: 7.4 10*3/uL (ref 1.4–7.7)
RBC: 4.21 Mil/uL (ref 3.87–5.11)
RDW: 13.2 % (ref 11.5–14.6)
WBC: 9.6 10*3/uL (ref 4.5–10.5)

## 2012-01-30 MED ORDER — ESCITALOPRAM OXALATE 10 MG PO TABS: 10.0000 mg | ORAL_TABLET | Freq: Every day | ORAL | Status: DC

## 2012-01-30 MED ORDER — HYDROCHLOROTHIAZIDE 25 MG PO TABS: 25.0000 mg | ORAL_TABLET | Freq: Every morning | ORAL | Status: DC

## 2012-01-30 MED ORDER — FLUTICASONE PROPIONATE 50 MCG/ACT NA SUSP: 2.0000 | Freq: Every day | NASAL | Status: DC

## 2012-01-30 NOTE — Telephone Encounter (Signed)
Rx sent electronically, patient advised via telephone. 

## 2012-01-30 NOTE — Telephone Encounter (Signed)
Pt call today, need Rx for Hydrochloaize 25mg . Pt says she forgot to request from physician.Marland KitchenMarland Kitchen

## 2012-01-30 NOTE — Progress Notes (Signed)
Subjective:    Patient ID: Erin Hinton, female    DOB: 13-Mar-1946, 66 y.o.   MRN: [id number]  HPI Here for health maintenance exam and to review chronic medical problems   Is doing well overall  Is caring for grandkids - enjoys that   Wt is up 3 lb with bmi of 27 More around the middle  Is still exercising and eating healthy   colonosc 04 - f/u 10 years- so good on that    dexa 2011 improved slt osteopenia  Ca and D- is taking  Exercising No meds  Smoking- the same   Tab status-- is trying quit with elect cig (with nicotine)  1/2ppd if working - more if not  Is improving but not ready to quit yet   Gyn exam-- last year  No gyn problems  No abn paps   mammo had oct 12 - nl  Self exam --no lumps   Lipid- due for check Lab Results  Component Value Date   CHOL 211* 11/28/2010   HDL 55.60 11/28/2010   LDLDIRECT 128.6 11/28/2010   TRIG 44.0 11/28/2010   CHOLHDL 4 11/28/2010   just had this done at [facility] with hdl 52, LDL 114, tg 78, tot chol 182  Is a big improvement with eating better and walking   Flu shot -- had it in October   Patient Active Problem List  Diagnoses  . HYPERLIPIDEMIA  . TOBACCO USE  . HYPERTENSION, BENIGN ESSENTIAL  . MITRAL VALVE PROLAPSE  . FIBROCYSTIC BREAST DISEASE  . OSTEOPENIA  . ALLERGY  . Routine general medical examination at a health care facility   Past Medical History  Diagnosis Date  . Hyperlipidemia   . Osteopenia   . Hypertension    Past Surgical History  Procedure Date  . Breast surgery 1998    breast biopsy DCIS  . Breast biopsy 01/2002    neg   History  Substance Use Topics  . Smoking status: Current Everyday Smoker  . Smokeless tobacco: Not on file  . Alcohol Use:    Family History  Problem Relation Age of Onset  . Lymphoma Father   . Heart disease Father     MI  . Hypertension Father   . Alcohol abuse Brother   . Cancer Maternal Aunt     breast cancer   Allergies  Allergen Reactions  .  Alendronate Sodium     REACTION: GI  . Atorvastatin     REACTION: Psin  . Pseudoephedrine     REACTION: palpitations  . Sertraline Hcl     REACTION: ? reaction   No current outpatient prescriptions on file prior to visit.       Review of Systems Review of Systems  Constitutional: Negative for fever, appetite change, fatigue and unexpected weight change.  Eyes: Negative for pain and visual disturbance.  Respiratory: Negative for cough and shortness of breath.   Cardiovascular: Negative for cp or palpitations    Gastrointestinal: Negative for nausea, diarrhea and constipation.  Genitourinary: Negative for urgency and frequency.  Skin: Negative for pallor or rash   Neurological: Negative for weakness, light-headedness, numbness and headaches.  Hematological: Negative for adenopathy. Does not bruise/bleed easily.  Psychiatric/Behavioral: Negative for dysphoric mood. The patient is not nervous/anxious.          Objective:   Physical Exam  Constitutional: She appears well-developed and well-nourished. No distress.  HENT:  Head: Normocephalic and atraumatic.  Right Ear: External ear normal.  Left  Ear: External ear normal.  Nose: Nose normal.  Mouth/Throat: Oropharynx is clear and moist.       Nares are boggy  Eyes: Conjunctivae and EOM are normal. Pupils are equal, round, and reactive to light. No scleral icterus.  Neck: Normal range of motion. Neck supple. No JVD present. Carotid bruit is not present. No thyromegaly present.  Cardiovascular: Normal rate, regular rhythm, normal heart sounds and intact distal pulses.  Exam reveals no gallop.   Pulmonary/Chest: Effort normal and breath sounds normal. No respiratory distress. She has no wheezes.       Diffusely distant bs   Abdominal: Soft. Bowel sounds are normal. She exhibits no distension, no abdominal bruit and no mass. There is no tenderness.  Genitourinary: No breast swelling, tenderness, discharge or bleeding.       Breast  exam: No mass, nodules, thickening, tenderness, bulging, retraction, inflamation, nipple discharge or skin changes noted.  No axillary or clavicular LA.  Chaperoned exam.    Musculoskeletal: Normal range of motion. She exhibits no edema and no tenderness.       No kyphosis   Lymphadenopathy:    She has no cervical adenopathy.  Neurological: She has normal reflexes. No cranial nerve deficit. She exhibits normal muscle tone. Coordination normal.  Skin: Skin is warm and dry. No rash noted. No erythema. No pallor.       Solar lentigos diffusely   Psychiatric: She has a normal mood and affect.       In good spirits today          Assessment & Plan:

## 2012-01-30 NOTE — Patient Instructions (Signed)
Keep thinking about quitting smoking Try to get 1200-1500 mg of calcium per day with at least 1000 iu of vitamin D - for bone health  Keep exercising  Labs today  Cholesterol looks better-keep up the good diet

## 2012-01-31 NOTE — Assessment & Plan Note (Signed)
Disc in detail risks of smoking and possible outcomes including copd, vascular/ heart disease, cancer , respiratory and sinus infections  Pt voices understanding  She is cutting down but not ready to quit yet

## 2012-01-31 NOTE — Assessment & Plan Note (Signed)
Reviewed health habits including diet and exercise and skin cancer prevention Also reviewed health mt list, fam hx and immunizations   Wellness labs today Chol improved  imms rev Enc to quit smoking

## 2012-01-31 NOTE — Assessment & Plan Note (Signed)
Reviewed labs from [facility] - overall much imp Disc goals for lipids and reasons to control them Rev labs with pt Rev low sat fat diet in detail  Urged to keep up the good work with diet and exercise

## 2012-01-31 NOTE — Assessment & Plan Note (Signed)
Reviewed last dexa  Disc imp of exercise/ ca and D and most importantly smoking cessation  Rev fx risk and safety concerns

## 2012-01-31 NOTE — Assessment & Plan Note (Signed)
bp in fair control at this time  No changes needed  Disc lifstyle change with low sodium diet and exercise   

## 2012-09-22 ENCOUNTER — Other Ambulatory Visit: Payer: Self-pay | Admitting: Family Medicine

## 2012-09-22 DIAGNOSIS — Z1231 Encounter for screening mammogram for malignant neoplasm of breast: Secondary | ICD-10-CM

## 2012-11-03 ENCOUNTER — Ambulatory Visit
Admission: RE | Admit: 2012-11-03 | Discharge: 2012-11-03 | Disposition: A | Discharge status: Home / Self Care | Payer: Managed Care, Other (non HMO) | Source: Ambulatory Visit | Attending: Family Medicine | Admitting: Family Medicine

## 2012-11-03 DIAGNOSIS — Z1231 Encounter for screening mammogram for malignant neoplasm of breast: Secondary | ICD-10-CM

## 2012-11-06 ENCOUNTER — Encounter: Payer: Self-pay | Admitting: *Deleted

## 2013-02-14 ENCOUNTER — Other Ambulatory Visit: Payer: Self-pay | Admitting: Family Medicine

## 2013-03-09 ENCOUNTER — Encounter: Payer: Self-pay | Admitting: Family Medicine

## 2013-03-09 ENCOUNTER — Ambulatory Visit (INDEPENDENT_AMBULATORY_CARE_PROVIDER_SITE_OTHER): Payer: Managed Care, Other (non HMO) | Admitting: Family Medicine

## 2013-03-09 VITALS — BP 140/84 | HR 86 | Temp 98.6°F | Ht 65.5 in | Wt 152.4 lb

## 2013-03-09 DIAGNOSIS — E785 Hyperlipidemia, unspecified: Secondary | ICD-10-CM

## 2013-03-09 DIAGNOSIS — I10 Essential (primary) hypertension: Secondary | ICD-10-CM

## 2013-03-09 DIAGNOSIS — Z1211 Encounter for screening for malignant neoplasm of colon: Secondary | ICD-10-CM

## 2013-03-09 DIAGNOSIS — Z0000 Encounter for general adult medical examination without abnormal findings: Secondary | ICD-10-CM

## 2013-03-09 DIAGNOSIS — M949 Disorder of cartilage, unspecified: Secondary | ICD-10-CM

## 2013-03-09 LAB — CBC WITH DIFFERENTIAL/PLATELET
Basophils Absolute: 0 10*3/uL (ref 0.0–0.1)
Basophils Relative: 0.3 % (ref 0.0–3.0)
Eosinophils Relative: 0.9 % (ref 0.0–5.0)
Hemoglobin: 13.9 g/dL (ref 12.0–15.0)
Lymphocytes Relative: 11.9 % — ABNORMAL LOW (ref 12.0–46.0)
Monocytes Relative: 6.2 % (ref 3.0–12.0)
Neutro Abs: 7 10*3/uL (ref 1.4–7.7)
RBC: 4.04 Mil/uL (ref 3.87–5.11)
RDW: 13.6 % (ref 11.5–14.6)

## 2013-03-09 LAB — LIPID PANEL
Cholesterol: 191 mg/dL (ref 0–200)
LDL Cholesterol: 120 mg/dL — ABNORMAL HIGH (ref 0–99)
VLDL: 16.6 mg/dL (ref 0.0–40.0)

## 2013-03-09 LAB — COMPREHENSIVE METABOLIC PANEL
ALT: 17 U/L (ref 0–35)
CO2: 27 mEq/L (ref 19–32)
Calcium: 9.4 mg/dL (ref 8.4–10.5)
Chloride: 103 mEq/L (ref 96–112)
Creatinine, Ser: 0.7 mg/dL (ref 0.4–1.2)
GFR: 85.14 mL/min (ref >60.00)
Glucose, Bld: 101 mg/dL — ABNORMAL HIGH (ref 70–99)

## 2013-03-09 MED ORDER — ESCITALOPRAM OXALATE 10 MG PO TABS: 10.0000 mg | ORAL_TABLET | Freq: Every day | ORAL | Status: DC

## 2013-03-09 MED ORDER — FLUTICASONE PROPIONATE 50 MCG/ACT NA SUSP: 2.0000 | NASAL | Status: DC | PRN

## 2013-03-09 MED ORDER — HYDROCHLOROTHIAZIDE 25 MG PO TABS: 25.0000 mg | ORAL_TABLET | Freq: Every day | ORAL | Status: DC

## 2013-03-09 NOTE — Assessment & Plan Note (Signed)
bp is stable today  No cp or palpitations or headaches or edema  No side effects to medicines  BP Readings from Last 3 Encounters:  03/09/13 140/84  01/30/12 128/74  12/06/10 126/78     Refilled med

## 2013-03-09 NOTE — Assessment & Plan Note (Signed)
Pt declines colonosc for another year due to ins Will give IFOB card

## 2013-03-09 NOTE — Patient Instructions (Addendum)
Labs today Keep thinking about quitting smoking Keep walking  Take care of yourself  Do stool card for colon cancer screening

## 2013-03-09 NOTE — Assessment & Plan Note (Signed)
Pt declines dexa  Enc ca and D and exercise No falls or fx  D level today

## 2013-03-09 NOTE — Assessment & Plan Note (Signed)
Lab today Rev low sat fat diet   

## 2013-03-09 NOTE — Progress Notes (Signed)
Subjective:    Patient ID: Erin Hinton, female    DOB: June 02, 1946, 67 y.o.   MRN: [id number]  HPI Here for health maintenance exam and to review chronic medical problems    Is doing well overall   Wt is down 11 lb - is more or less back to her baseline - had originally gained after the holidays Is walking regularly  bmi is 24  mammo 11/13 with 1 year follow up  Self exam - is ok  Hx of DCIS- doing fine with f/u    dexa 12/11 Hx of low bone mass She declines it  Was unable to take fosamax in past  Is on ca and D No falls or fx   Flu vaccine- in the fall  utd pneumovax  Due for labs today   colonosc 4/04 Wants to wait until next year - and her current ins will not pay - waiting for medicare to do that   Gyn Pap 09- was fine  No symptoms or problems  Declines exam at this time   bp is stable today - tends to have white coat syndrome  No cp or palpitations or headaches or edema  No side effects to medicines  BP Readings from Last 3 Encounters:  03/09/13 140/84  01/30/12 128/74  12/06/10 126/78     Due for chol check and wellness labs   Mood - stable despite stress (daughter had emergent surgery on gallbladder)    Patient Active Problem List  Diagnosis  . HYPERLIPIDEMIA  . TOBACCO USE  . HYPERTENSION, BENIGN ESSENTIAL  . MITRAL VALVE PROLAPSE  . FIBROCYSTIC BREAST DISEASE  . OSTEOPENIA  . ALLERGY  . Routine general medical examination at a health care facility   Past Medical History  Diagnosis Date  . Hyperlipidemia   . Osteopenia   . Hypertension    Past Surgical History  Procedure Laterality Date  . 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 Breast biopsy  01/2002    neg   History  Substance Use Topics  . Smoking status: Current Every Day Smoker -- 0.50 packs/day    Types: Cigarettes  . Smokeless tobacco: Not on file     Comment: also uses e-cigaretts  . Alcohol Use: No   Family History  Problem Relation Age of Onset  .  Lymphoma Father   . Heart disease Father     MI  . Hypertension Father   . Alcohol abuse Brother   . Cancer Maternal Aunt     breast cancer   Allergies  Allergen Reactions  . Alendronate Sodium     REACTION: GI  . Atorvastatin     REACTION: Psin  . Pseudoephedrine     REACTION: palpitations  . Sertraline Hcl     REACTION: ? reaction   Current Outpatient Prescriptions on File Prior to Visit  Medication Sig Dispense Refill  . Ascorbic Acid (VITAMIN C) 1000 MG tablet Take 1,000 mg by mouth daily.      Marland Kitchen aspirin 81 MG tablet Take 81 mg by mouth daily.      . Calcium Carbonate 1500 MG TABS Take by mouth as directed.      . escitalopram (LEXAPRO) 10 MG tablet TAKE 1 TABLET BY MOUTH EVERY DAY  90 tablet  0  . Flaxseed, Linseed, (FLAX SEED OIL) 1000 MG CAPS Take 1 capsule by mouth daily.      . hydrochlorothiazide (HYDRODIURIL) 25 MG tablet TAKE 1 TABLET  BY MOUTH EVERY MORNING  90 tablet  0  . Multiple Vitamin (MULTIVITAMIN) tablet Take 1 tablet by mouth daily.      . Omega-3 Fatty Acids (FISH OIL PO) Take 1 tablet by mouth 2 (two) times daily.      . vitamin E 1000 UNIT capsule Take 1,000 Units by mouth daily.       No current facility-administered medications on file prior to visit.     Review of Systems Review of Systems  Constitutional: Negative for fever, appetite change, fatigue and unexpected weight change.  Eyes: Negative for pain and visual disturbance.  Respiratory: Negative for cough and shortness of breath.  neg for wheeze Cardiovascular: Negative for cp or palpitations    Gastrointestinal: Negative for nausea, diarrhea and constipation.  Genitourinary: Negative for urgency and frequency.  Skin: Negative for pallor or rash   Neurological: Negative for weakness, light-headedness, numbness and headaches.  Hematological: Negative for adenopathy. Does not bruise/bleed easily.  Psychiatric/Behavioral: Negative for dysphoric mood. The patient is not nervous/anxious.          Objective:   Physical Exam  Constitutional: She appears well-developed and well-nourished. No distress.  HENT:  Head: Normocephalic and atraumatic.  Right Ear: External ear normal.  Left Ear: External ear normal.  Nose: Nose normal.  Mouth/Throat: Oropharynx is clear and moist.  Eyes: Conjunctivae and EOM are normal. Pupils are equal, round, and reactive to light. Right eye exhibits no discharge. Left eye exhibits no discharge. No scleral icterus.  Neck: Normal range of motion. Neck supple. No JVD present. Carotid bruit is not present. No thyromegaly present.  Cardiovascular: Normal rate, regular rhythm, normal heart sounds and intact distal pulses.  Exam reveals no gallop.   Pulmonary/Chest: Effort normal and breath sounds normal. No respiratory distress. She has no wheezes. She has no rales.  Abdominal: Soft. Bowel sounds are normal. She exhibits no distension, no abdominal bruit and no mass. There is no tenderness.  Genitourinary: No breast swelling, tenderness, discharge or bleeding.  Breast exam: No mass, nodules, thickening, tenderness, bulging, retraction, inflamation, nipple discharge or skin changes noted.  No axillary or clavicular LA.  Chaperoned exam.     Musculoskeletal: She exhibits no edema and no tenderness.  Lymphadenopathy:    She has no cervical adenopathy.  Neurological: She is alert. She has normal reflexes. No cranial nerve deficit. She exhibits normal muscle tone. Coordination normal.  Skin: Skin is warm and dry. No rash noted. No erythema. No pallor.  Solar lentigos diffusely   Psychiatric: She has a normal mood and affect.          Assessment & Plan:

## 2013-03-09 NOTE — Assessment & Plan Note (Signed)
Reviewed health habits including diet and exercise and skin cancer prevention Also reviewed health mt list, fam hx and immunizations  Wellness labs today Enc smoking cessation (Disc in detail risks of smoking and possible outcomes including copd, vascular/ heart disease, cancer , respiratory and sinus infections ) Pt voices understanding - she is starting to use some e- cig also

## 2013-03-13 ENCOUNTER — Other Ambulatory Visit (INDEPENDENT_AMBULATORY_CARE_PROVIDER_SITE_OTHER): Payer: Managed Care, Other (non HMO)

## 2013-03-13 ENCOUNTER — Other Ambulatory Visit: Payer: Managed Care, Other (non HMO)

## 2013-03-13 DIAGNOSIS — Z1211 Encounter for screening for malignant neoplasm of colon: Secondary | ICD-10-CM

## 2013-03-13 LAB — FECAL OCCULT BLOOD, IMMUNOCHEMICAL: Fecal Occult Bld: NEGATIVE

## 2013-08-26 DIAGNOSIS — R0982 Postnasal drip: Secondary | ICD-10-CM | POA: Diagnosis not present

## 2013-08-26 DIAGNOSIS — J019 Acute sinusitis, unspecified: Secondary | ICD-10-CM | POA: Diagnosis not present

## 2013-09-08 ENCOUNTER — Other Ambulatory Visit: Payer: Self-pay | Admitting: Family Medicine

## 2013-09-08 MED ORDER — ESCITALOPRAM OXALATE 10 MG PO TABS: 10.0000 mg | ORAL_TABLET | Freq: Every day | ORAL | Status: DC

## 2013-09-08 MED ORDER — HYDROCHLOROTHIAZIDE 25 MG PO TABS: 25.0000 mg | ORAL_TABLET | Freq: Every day | ORAL | Status: DC

## 2013-09-08 NOTE — Telephone Encounter (Signed)
Received fax requesting 90 day supply Rx to pharmacy, please advise

## 2013-09-08 NOTE — Telephone Encounter (Signed)
done

## 2013-09-08 NOTE — Telephone Encounter (Signed)
Addressed.

## 2013-09-08 NOTE — Telephone Encounter (Signed)
Please refill both for 6 mo  

## 2013-10-07 DIAGNOSIS — Z23 Encounter for immunization: Secondary | ICD-10-CM | POA: Diagnosis not present

## 2013-10-14 ENCOUNTER — Other Ambulatory Visit: Payer: Self-pay

## 2013-10-14 DIAGNOSIS — Z9889 Other specified postprocedural states: Secondary | ICD-10-CM

## 2013-10-14 DIAGNOSIS — Z1231 Encounter for screening mammogram for malignant neoplasm of breast: Secondary | ICD-10-CM

## 2013-10-14 DIAGNOSIS — Z853 Personal history of malignant neoplasm of breast: Secondary | ICD-10-CM

## 2013-10-22 ENCOUNTER — Other Ambulatory Visit: Payer: Self-pay

## 2013-11-18 ENCOUNTER — Ambulatory Visit
Admission: RE | Admit: 2013-11-18 | Discharge: 2013-11-18 | Disposition: A | Discharge status: Home / Self Care | Payer: Medicare Other | Source: Ambulatory Visit

## 2013-11-18 DIAGNOSIS — Z1231 Encounter for screening mammogram for malignant neoplasm of breast: Secondary | ICD-10-CM | POA: Diagnosis not present

## 2013-11-18 DIAGNOSIS — Z853 Personal history of malignant neoplasm of breast: Secondary | ICD-10-CM

## 2013-11-18 DIAGNOSIS — Z9889 Other specified postprocedural states: Secondary | ICD-10-CM

## 2013-11-19 ENCOUNTER — Other Ambulatory Visit: Payer: Self-pay | Admitting: Family Medicine

## 2013-11-19 DIAGNOSIS — R928 Other abnormal and inconclusive findings on diagnostic imaging of breast: Secondary | ICD-10-CM

## 2013-11-25 ENCOUNTER — Ambulatory Visit
Admission: RE | Admit: 2013-11-25 | Discharge: 2013-11-25 | Disposition: A | Discharge status: Home / Self Care | Payer: Medicare Other | Source: Ambulatory Visit | Attending: Family Medicine | Admitting: Family Medicine

## 2013-11-25 DIAGNOSIS — R928 Other abnormal and inconclusive findings on diagnostic imaging of breast: Secondary | ICD-10-CM

## 2013-11-25 DIAGNOSIS — N6489 Other specified disorders of breast: Secondary | ICD-10-CM | POA: Diagnosis not present

## 2014-01-20 DIAGNOSIS — H2510 Age-related nuclear cataract, unspecified eye: Secondary | ICD-10-CM | POA: Diagnosis not present

## 2014-03-06 ENCOUNTER — Other Ambulatory Visit: Payer: Self-pay | Admitting: Family Medicine

## 2014-03-31 ENCOUNTER — Telehealth: Payer: Self-pay | Admitting: Family Medicine

## 2014-03-31 DIAGNOSIS — M899 Disorder of bone, unspecified: Secondary | ICD-10-CM

## 2014-03-31 DIAGNOSIS — M949 Disorder of cartilage, unspecified: Secondary | ICD-10-CM

## 2014-03-31 DIAGNOSIS — I10 Essential (primary) hypertension: Secondary | ICD-10-CM

## 2014-03-31 DIAGNOSIS — E785 Hyperlipidemia, unspecified: Secondary | ICD-10-CM

## 2014-03-31 NOTE — Telephone Encounter (Signed)
Message copied by Abner Greenspan on Wed Mar 31, 2014  7:28 PM ------      Message from: Ellamae Sia      Created: Thu Mar 25, 2014 10:13 AM      Regarding: Lab orders for Thursday, 4.16.15       Patient is scheduled for CPX labs, please order future labs, Thanks , Terri       ------

## 2014-04-01 ENCOUNTER — Other Ambulatory Visit (INDEPENDENT_AMBULATORY_CARE_PROVIDER_SITE_OTHER): Payer: Medicare Other

## 2014-04-01 DIAGNOSIS — I10 Essential (primary) hypertension: Secondary | ICD-10-CM

## 2014-04-01 DIAGNOSIS — M899 Disorder of bone, unspecified: Secondary | ICD-10-CM | POA: Diagnosis not present

## 2014-04-01 DIAGNOSIS — E785 Hyperlipidemia, unspecified: Secondary | ICD-10-CM | POA: Diagnosis not present

## 2014-04-01 DIAGNOSIS — M949 Disorder of cartilage, unspecified: Secondary | ICD-10-CM

## 2014-04-01 LAB — TSH: TSH: 1.78 u[IU]/mL (ref 0.35–5.50)

## 2014-04-01 LAB — CBC WITH DIFFERENTIAL/PLATELET
BASOS ABS: 0 10*3/uL (ref 0.0–0.1)
Basophils Relative: 0.5 % (ref 0.0–3.0)
Eosinophils Absolute: 0.1 10*3/uL (ref 0.0–0.7)
Eosinophils Relative: 2.7 % (ref 0.0–5.0)
HEMATOCRIT: 42.2 % (ref 36.0–46.0)
HEMOGLOBIN: 14.2 g/dL (ref 12.0–15.0)
LYMPHS ABS: 1 10*3/uL (ref 0.7–4.0)
Lymphocytes Relative: 20 % (ref 12.0–46.0)
MCHC: 33.7 g/dL (ref 30.0–36.0)
MCV: 99.9 fl (ref 78.0–100.0)
MONO ABS: 0.4 10*3/uL (ref 0.1–1.0)
MONOS PCT: 7.1 % (ref 3.0–12.0)
NEUTROS ABS: 3.6 10*3/uL (ref 1.4–7.7)
Neutrophils Relative %: 69.7 % (ref 43.0–77.0)
PLATELETS: 217 10*3/uL (ref 150.0–400.0)
RBC: 4.23 Mil/uL (ref 3.87–5.11)
RDW: 13.7 % (ref 11.5–14.6)
WBC: 5.2 10*3/uL (ref 4.5–10.5)

## 2014-04-01 LAB — COMPREHENSIVE METABOLIC PANEL
ALT: 13 U/L (ref 0–35)
AST: 18 U/L (ref 0–37)
Albumin: 4.1 g/dL (ref 3.5–5.2)
Alkaline Phosphatase: 53 U/L (ref 39–117)
BUN: 18 mg/dL (ref 6–23)
CO2: 29 meq/L (ref 19–32)
CREATININE: 0.8 mg/dL (ref 0.4–1.2)
Calcium: 9.7 mg/dL (ref 8.4–10.5)
Chloride: 105 mEq/L (ref 96–112)
GFR: 74.21 mL/min (ref >60.00)
Glucose, Bld: 96 mg/dL (ref 70–99)
Potassium: 4.6 mEq/L (ref 3.5–5.1)
Sodium: 140 mEq/L (ref 135–145)
Total Bilirubin: 0.6 mg/dL (ref 0.3–1.2)
Total Protein: 7.1 g/dL (ref 6.0–8.3)

## 2014-04-01 LAB — LIPID PANEL
CHOLESTEROL: 211 mg/dL — AB (ref 0–200)
HDL: 57 mg/dL (ref >39.00)
LDL CALC: 137 mg/dL — AB (ref 0–99)
Total CHOL/HDL Ratio: 4
Triglycerides: 84 mg/dL (ref 0.0–149.0)
VLDL: 16.8 mg/dL (ref 0.0–40.0)

## 2014-04-02 LAB — VITAMIN D 25 HYDROXY (VIT D DEFICIENCY, FRACTURES): Vit D, 25-Hydroxy: 34 ng/mL (ref 30–89)

## 2014-04-07 ENCOUNTER — Ambulatory Visit (INDEPENDENT_AMBULATORY_CARE_PROVIDER_SITE_OTHER): Payer: Medicare Other | Admitting: Family Medicine

## 2014-04-07 ENCOUNTER — Encounter: Payer: Self-pay | Admitting: Family Medicine

## 2014-04-07 ENCOUNTER — Telehealth: Payer: Self-pay | Admitting: Family Medicine

## 2014-04-07 VITALS — BP 122/76 | HR 66 | Temp 98.2°F | Ht 65.25 in | Wt 150.5 lb

## 2014-04-07 DIAGNOSIS — Z23 Encounter for immunization: Secondary | ICD-10-CM | POA: Diagnosis not present

## 2014-04-07 DIAGNOSIS — F17200 Nicotine dependence, unspecified, uncomplicated: Secondary | ICD-10-CM

## 2014-04-07 DIAGNOSIS — Z1211 Encounter for screening for malignant neoplasm of colon: Secondary | ICD-10-CM

## 2014-04-07 DIAGNOSIS — Z0000 Encounter for general adult medical examination without abnormal findings: Secondary | ICD-10-CM

## 2014-04-07 DIAGNOSIS — E785 Hyperlipidemia, unspecified: Secondary | ICD-10-CM | POA: Diagnosis not present

## 2014-04-07 DIAGNOSIS — M899 Disorder of bone, unspecified: Secondary | ICD-10-CM

## 2014-04-07 DIAGNOSIS — I10 Essential (primary) hypertension: Secondary | ICD-10-CM | POA: Diagnosis not present

## 2014-04-07 DIAGNOSIS — Z01810 Encounter for preprocedural cardiovascular examination: Secondary | ICD-10-CM | POA: Insufficient documentation

## 2014-04-07 DIAGNOSIS — M949 Disorder of cartilage, unspecified: Secondary | ICD-10-CM

## 2014-04-07 MED ORDER — HYDROCHLOROTHIAZIDE 25 MG PO TABS: ORAL_TABLET | ORAL | Status: DC

## 2014-04-07 MED ORDER — ESCITALOPRAM OXALATE 10 MG PO TABS: ORAL_TABLET | ORAL | Status: DC

## 2014-04-07 NOTE — Assessment & Plan Note (Signed)
bp in fair control at this time  BP Readings from Last 1 Encounters:  04/07/14 122/76   No changes needed Disc lifstyle change with low sodium diet and exercise  Med renewed  Labs rev

## 2014-04-07 NOTE — Telephone Encounter (Signed)
Relevant patient education assigned to patient using Emmi. ° °

## 2014-04-07 NOTE — Assessment & Plan Note (Signed)
D/w patient QX:IHWTUUE for colon cancer screening, including IFOB vs. colonoscopy.  Risks and benefits of both were discussed and patient voiced understanding.  Pt elects for: Ifob

## 2014-04-07 NOTE — Assessment & Plan Note (Signed)
Due for dexa but declines at this time  No fx  Prev smoker Will add D3 1000 iu for level in the 30s Disc need for calcium/ vitamin D/ wt bearing exercise and bone density test every 2 y to monitor Disc safety/ fracture risk in detail

## 2014-04-07 NOTE — Progress Notes (Signed)
Pre visit review using our clinic review tool, if applicable. No additional management support is needed unless otherwise documented below in the visit note. 

## 2014-04-07 NOTE — Assessment & Plan Note (Signed)
Disc goals for lipids and reasons to control them Rev labs with pt Rev low sat fat diet in detail Up a bit  Given diet handout

## 2014-04-07 NOTE — Patient Instructions (Signed)
Pneumonia vaccine today  Please do the iFOB card for colon cancer screening  Keep thinking about quitting smoking Add another 1000 iu of vit D per day   Avoid red meat/ fried foods/ egg yolks/ fatty breakfast meats/ butter, cheese and high fat dairy/ and shellfish      Fat and Cholesterol Control Diet Fat and cholesterol levels in your blood and organs are influenced by your diet. High levels of fat and cholesterol may lead to diseases of the heart, small and large blood vessels, gallbladder, liver, and pancreas. CONTROLLING FAT AND CHOLESTEROL WITH DIET Although exercise and lifestyle factors are important, your diet is key. That is because certain foods are known to raise cholesterol and others to lower it. The goal is to balance foods for their effect on cholesterol and more importantly, to replace saturated and trans fat with other types of fat, such as monounsaturated fat, polyunsaturated fat, and omega-3 fatty acids. On average, a person should consume no more than 15 to 17 g of saturated fat daily. Saturated and trans fats are considered "bad" fats, and they will raise LDL cholesterol. Saturated fats are primarily found in animal products such as meats, butter, and cream. However, that does not mean you need to give up all your favorite foods. Today, there are good tasting, low-fat, low-cholesterol substitutes for most of the things you like to eat. Choose low-fat or nonfat alternatives. Choose round or loin cuts of red meat. These types of cuts are lowest in fat and cholesterol. Chicken (without the skin), fish, veal, and ground [place] breast are great choices. Eliminate fatty meats, such as hot dogs and salami. Even shellfish have little or no saturated fat. Have a 3 oz (85 g) portion when you eat lean meat, poultry, or fish. Trans fats are also called "partially hydrogenated oils." They are oils that have been scientifically manipulated so that they are solid at room temperature resulting in  a longer shelf life and improved taste and texture of foods in which they are added. Trans fats are found in stick margarine, some tub margarines, cookies, crackers, and baked goods.  When baking and cooking, oils are a great substitute for butter. The monounsaturated oils are especially beneficial since it is believed they lower LDL and raise HDL. The oils you should avoid entirely are saturated tropical oils, such as coconut and palm.  Remember to eat a lot from food groups that are naturally free of saturated and trans fat, including fish, fruit, vegetables, beans, grains (barley, rice, couscous, bulgur wheat), and pasta (without cream sauces).  IDENTIFYING FOODS THAT LOWER FAT AND CHOLESTEROL  Soluble fiber may lower your cholesterol. This type of fiber is found in fruits such as apples, vegetables such as broccoli, potatoes, and carrots, legumes such as beans, peas, and lentils, and grains such as barley. Foods fortified with plant sterols (phytosterol) may also lower cholesterol. You should eat at least 2 g per day of these foods for a cholesterol lowering effect.  Read package labels to identify low-saturated fats, trans fat free, and low-fat foods at the supermarket. Select cheeses that have only 2 to 3 g saturated fat per ounce. Use a heart-healthy tub margarine that is free of trans fats or partially hydrogenated oil. When buying baked goods (cookies, crackers), avoid partially hydrogenated oils. Breads and muffins should be made from whole grains (whole-wheat or whole oat flour, instead of "flour" or "enriched flour"). Buy non-creamy canned soups with reduced salt and no added fats.  FOOD  PREPARATION TECHNIQUES  Never deep-fry. If you must fry, either stir-fry, which uses very little fat, or use non-stick cooking sprays. When possible, broil, bake, or roast meats, and steam vegetables. Instead of putting butter or margarine on vegetables, use lemon and herbs, applesauce, and cinnamon (for squash  and sweet potatoes). Use nonfat yogurt, salsa, and low-fat dressings for salads.  LOW-SATURATED FAT / LOW-FAT FOOD SUBSTITUTES Meats / Saturated Fat (g)  Avoid: Steak, marbled (3 oz/85 g) / 11 g  Choose: Steak, lean (3 oz/85 g) / 4 g  Avoid: Hamburger (3 oz/85 g) / 7 g  Choose: Hamburger, lean (3 oz/85 g) / 5 g  Avoid: Ham (3 oz/85 g) / 6 g  Choose: Ham, lean cut (3 oz/85 g) / 2.4 g  Avoid: Chicken, with skin, dark meat (3 oz/85 g) / 4 g  Choose: Chicken, skin removed, dark meat (3 oz/85 g) / 2 g  Avoid: Chicken, with skin, light meat (3 oz/85 g) / 2.5 g  Choose: Chicken, skin removed, light meat (3 oz/85 g) / 1 g Dairy / Saturated Fat (g)  Avoid: Whole milk (1 cup) / 5 g  Choose: Low-fat milk, 2% (1 cup) / 3 g  Choose: Low-fat milk, 1% (1 cup) / 1.5 g  Choose: Skim milk (1 cup) / 0.3 g  Avoid: Hard cheese (1 oz/28 g) / 6 g  Choose: Skim milk cheese (1 oz/28 g) / 2 to 3 g  Avoid: Cottage cheese, 4% fat (1 cup) / 6.5 g  Choose: Low-fat cottage cheese, 1% fat (1 cup) / 1.5 g  Avoid: Ice cream (1 cup) / 9 g  Choose: Sherbet (1 cup) / 2.5 g  Choose: Nonfat frozen yogurt (1 cup) / 0.3 g  Choose: Frozen fruit bar / trace  Avoid: Whipped cream (1 tbs) / 3.5 g  Choose: Nondairy whipped topping (1 tbs) / 1 g Condiments / Saturated Fat (g)  Avoid: Mayonnaise (1 tbs) / 2 g  Choose: Low-fat mayonnaise (1 tbs) / 1 g  Avoid: Butter (1 tbs) / 7 g  Choose: Extra light margarine (1 tbs) / 1 g  Avoid: Coconut oil (1 tbs) / 11.8 g  Choose: Olive oil (1 tbs) / 1.8 g  Choose: Corn oil (1 tbs) / 1.7 g  Choose: Safflower oil (1 tbs) / 1.2 g  Choose: Sunflower oil (1 tbs) / 1.4 g  Choose: Soybean oil (1 tbs) / 2.4 g  Choose: Canola oil (1 tbs) / 1 g Document Released: 12/03/2005 Document Revised: 03/30/2013 Document Reviewed: 05/24/2011 Exit[employer] Patient Information 2014 [place], [place].

## 2014-04-07 NOTE — Progress Notes (Signed)
Subjective:    Patient ID: Erin Hinton, female    DOB: Nov 12, 1946, 68 y.o.   MRN: [id number]  HPI I have personally reviewed the Medicare Annual Wellness questionnaire and have noted 1. The patient's medical and social history 2. Their use of alcohol, tobacco or illicit drugs 3. Their current medications and supplements 4. The patient's functional ability including ADL's, fall risks, home safety risks and hearing or visual             impairment. 5. Diet and physical activities 6. Evidence for depression or mood disorders  The patients weight, height, BMI have been recorded in the chart and visual acuity is per eye clinic.  I have made referrals, counseling and provided education to the patient based review of the above and I have provided the pt with a written personalized care plan for preventive services.  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  Routine anticipatory guidance given to patient.  See health maintenance. Colon cancer screening colonosc 4/04  And ifob card nl 3/14-will do a card again this year  Breast cancer screening mammogram 12/14-had a callback and it was ok  Self breast exam -no lumps or changes  Flu vaccine in the fall  Tetanus vaccine 9/10  Pneumovax 12/10 - will get that done today  Zoster vaccine 12/11 Advance directive - she has a living will set up  Cognitive function addressed- see scanned forms- and if abnormal then additional documentation follows. -no memory concerns , does word puzzles and reads a lot   PMH and SH reviewed  Meds, vitals, and allergies reviewed.   ROS: See HPI.  Otherwise negative.    bp is stable today  No cp or palpitations or headaches or edema  No side effects to medicines  BP Readings from Last 3 Encounters:  04/07/14 122/76  03/09/13 140/84  01/30/12 128/74     Wt is down 2 lb with bmi 24  Is a healthy eater for the most part  Exercises at least 4-5  tmes per week  Hyperlipidemia  Lab Results  Component Value Date   CHOL 211* 04/01/2014   CHOL 191 03/09/2013   CHOL 211* 11/28/2010   Lab Results  Component Value Date   HDL 57.00 04/01/2014   HDL 54.20 03/09/2013   HDL 55.60 11/28/2010   Lab Results  Component Value Date   LDLCALC 137* 04/01/2014   LDLCALC 120* 03/09/2013   Lab Results  Component Value Date   TRIG 84.0 04/01/2014   TRIG 83.0 03/09/2013   TRIG 44.0 11/28/2010   Lab Results  Component Value Date   CHOLHDL 4 04/01/2014   CHOLHDL 4 03/09/2013   CHOLHDL 4 11/28/2010   Lab Results  Component Value Date   LDLDIRECT 128.6 11/28/2010   LDLDIRECT 152.1 11/24/2009   LDLDIRECT 138.9 11/23/2008   recently she ate red meat -right before she had blood work- it is quite a rarity  In general has good habits   Smoking - about the same 1/2 e cig and 1/2 regular cig -- about a ppd  She knows she needs to quit -and has cut down but not ready to quit   D was 34  She takes ca plus D  Will add more D  No fractures  Was not interested in further dexa at this time    Patient Active Problem List   Diagnosis Date Noted  .  Encounter for Medicare annual wellness exam 04/07/2014  . Colon cancer screening 03/09/2013  . Routine general medical examination at a health care facility 01/30/2012  . HYPERTENSION, BENIGN ESSENTIAL 02/01/2010  . OSTEOPENIA 11/23/2008  . TOBACCO USE 10/21/2007  . HYPERLIPIDEMIA 09/29/2007  . MITRAL VALVE PROLAPSE 09/29/2007  . 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 ALLERGY 09/29/2007   Past Medical History  Diagnosis Date  . Hyperlipidemia   . Osteopenia   . Hypertension    Past Surgical History  Procedure Laterality Date  . 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 Breast biopsy  01/2002    neg   History  Substance Use Topics  . Smoking status: Current Every Day Smoker -- 0.50 packs/day    Types: Cigarettes  . Smokeless tobacco: Not on file     Comment: also uses e-cigaretts  .  Alcohol Use: No   Family History  Problem Relation Age of Onset  . Lymphoma Father   . Heart disease Father     MI  . Hypertension Father   . Alcohol abuse Brother   . Cancer Maternal Aunt     breast cancer   Allergies  Allergen Reactions  . Alendronate Sodium     REACTION: GI  . Atorvastatin     REACTION: Psin  . Pseudoephedrine     REACTION: palpitations  . Sertraline Hcl     REACTION: ? reaction   Current Outpatient Prescriptions on File Prior to Visit  Medication Sig Dispense Refill  . Ascorbic Acid (VITAMIN C) 1000 MG tablet Take 1,000 mg by mouth daily.      Marland Kitchen aspirin 81 MG tablet Take 81 mg by mouth daily.      . Calcium Carbonate 1500 MG TABS Take by mouth as directed.      . escitalopram (LEXAPRO) 10 MG tablet TAKE 1 TABLET BY MOUTH EVERY DAY  90 tablet  0  . Flaxseed, Linseed, (FLAX SEED OIL) 1000 MG CAPS Take 1 capsule by mouth daily.      . fluticasone (FLONASE) 50 MCG/ACT nasal spray Place 2 sprays into the nose as needed.  16 g  11  . hydrochlorothiazide (HYDRODIURIL) 25 MG tablet TAKE 1 TABLET BY MOUTH DAILY.  90 tablet  0  . Multiple Vitamin (MULTIVITAMIN) tablet Take 1 tablet by mouth daily.      . Omega-3 Fatty Acids (FISH OIL PO) Take 1 tablet by mouth 2 (two) times daily. Rotates with Flaxseed oil tabs      . vitamin E 1000 UNIT capsule Take 1,000 Units by mouth daily.       No current facility-administered medications on file prior to visit.      Review of Systems Review of Systems  Constitutional: Negative for fever, appetite change, fatigue and unexpected weight change.  Eyes: Negative for pain and visual disturbance.  Respiratory: Negative for cough and shortness of breath.   Cardiovascular: Negative for cp or palpitations    Gastrointestinal: Negative for nausea, diarrhea and constipation.  Genitourinary: Negative for urgency and frequency.  Skin: Negative for pallor or rash   Neurological: Negative for weakness, light-headedness, numbness and  headaches.  Hematological: Negative for adenopathy. Does not bruise/bleed easily.  Psychiatric/Behavioral: Negative for dysphoric mood. The patient is not nervous/anxious.         Objective:   Physical Exam  Constitutional: She appears well-developed and well-nourished. No distress.  HENT:  Head: Normocephalic and atraumatic.  Right Ear: External ear normal.  Left Ear: External ear normal.  Mouth/Throat: Oropharynx is clear and moist.  Eyes: Conjunctivae and EOM are normal. Pupils are equal, round, and reactive to light. No scleral icterus.  Neck: Normal range of motion. Neck supple. No JVD present. Carotid bruit is not present. No thyromegaly present.  Cardiovascular: Normal rate, regular rhythm, normal heart sounds and intact distal pulses.  Exam reveals no gallop.   Pulmonary/Chest: Effort normal and breath sounds normal. No respiratory distress. She has no wheezes. She exhibits no tenderness.  Diffusely distant bs   Abdominal: Soft. Bowel sounds are normal. She exhibits no distension, no abdominal bruit and no mass. There is no tenderness.  Genitourinary: No breast swelling, tenderness, discharge or bleeding.  Breast exam: No mass, nodules, thickening, tenderness, bulging, retraction, inflamation, nipple discharge or skin changes noted.  No axillary or clavicular LA.    Baseline scar noted   Musculoskeletal: Normal range of motion. She exhibits no edema and no tenderness.  Lymphadenopathy:    She has no cervical adenopathy.  Neurological: She is alert. She has normal reflexes. No cranial nerve deficit. She exhibits normal muscle tone. Coordination normal.  Skin: Skin is warm and dry. No rash noted. No erythema. No pallor.  Solar lentigos diffusely Few SK on back   Psychiatric: She has a normal mood and affect.          Assessment & Plan:

## 2014-04-07 NOTE — Assessment & Plan Note (Signed)
Reviewed health habits including diet and exercise and skin cancer prevention Reviewed appropriate screening tests for age  Also reviewed health mt list, fam hx and immunization status , as well as social and family history   See HPI Labs rev Counseled on smoking cessation

## 2014-04-07 NOTE — Assessment & Plan Note (Signed)
Disc in detail risks of smoking and possible outcomes including copd, vascular/ heart disease, cancer , respiratory and sinus infections  Pt voices understanding She is cutting down and supplements with e cig but not ready to quit

## 2014-05-04 ENCOUNTER — Other Ambulatory Visit (INDEPENDENT_AMBULATORY_CARE_PROVIDER_SITE_OTHER): Payer: Medicare Other

## 2014-05-04 DIAGNOSIS — Z1211 Encounter for screening for malignant neoplasm of colon: Secondary | ICD-10-CM | POA: Diagnosis not present

## 2014-05-04 LAB — FECAL OCCULT BLOOD, IMMUNOCHEMICAL: Fecal Occult Bld: NEGATIVE

## 2014-09-11 DIAGNOSIS — Z23 Encounter for immunization: Secondary | ICD-10-CM | POA: Diagnosis not present

## 2014-10-15 ENCOUNTER — Other Ambulatory Visit: Payer: Self-pay

## 2014-10-15 DIAGNOSIS — Z1231 Encounter for screening mammogram for malignant neoplasm of breast: Secondary | ICD-10-CM

## 2014-11-29 ENCOUNTER — Ambulatory Visit
Admission: RE | Admit: 2014-11-29 | Discharge: 2014-11-29 | Disposition: A | Discharge status: Home / Self Care | Payer: Medicare Other | Source: Ambulatory Visit

## 2014-11-29 DIAGNOSIS — Z1231 Encounter for screening mammogram for malignant neoplasm of breast: Secondary | ICD-10-CM | POA: Diagnosis not present

## 2015-05-03 DIAGNOSIS — J019 Acute sinusitis, unspecified: Secondary | ICD-10-CM | POA: Diagnosis not present

## 2015-05-03 DIAGNOSIS — I10 Essential (primary) hypertension: Secondary | ICD-10-CM | POA: Diagnosis not present

## 2015-05-03 DIAGNOSIS — R05 Cough: Secondary | ICD-10-CM | POA: Diagnosis not present

## 2015-05-10 ENCOUNTER — Telehealth: Payer: Self-pay | Admitting: Family Medicine

## 2015-05-10 DIAGNOSIS — E785 Hyperlipidemia, unspecified: Secondary | ICD-10-CM

## 2015-05-10 DIAGNOSIS — I10 Essential (primary) hypertension: Secondary | ICD-10-CM

## 2015-05-10 NOTE — Telephone Encounter (Signed)
-----   Message from Marchia Bond sent at 05/10/2015  8:20 AM EDT ----- Regarding: Cpx labs Wed 5/25, need orders please :-) Please order  future cpx labs for pt's upcoming lab appt. Thanks Aniceto Boss

## 2015-05-11 ENCOUNTER — Other Ambulatory Visit (INDEPENDENT_AMBULATORY_CARE_PROVIDER_SITE_OTHER): Payer: Medicare Other

## 2015-05-11 DIAGNOSIS — E785 Hyperlipidemia, unspecified: Secondary | ICD-10-CM | POA: Diagnosis not present

## 2015-05-11 DIAGNOSIS — I10 Essential (primary) hypertension: Secondary | ICD-10-CM | POA: Diagnosis not present

## 2015-05-11 LAB — CBC WITH DIFFERENTIAL/PLATELET
BASOS PCT: 0.4 % (ref 0.0–3.0)
Basophils Absolute: 0 10*3/uL (ref 0.0–0.1)
Eosinophils Absolute: 0.1 10*3/uL (ref 0.0–0.7)
Eosinophils Relative: 2.1 % (ref 0.0–5.0)
HCT: 42.5 % (ref 36.0–46.0)
Hemoglobin: 14.4 g/dL (ref 12.0–15.0)
Lymphocytes Relative: 19.1 % (ref 12.0–46.0)
Lymphs Abs: 1.1 10*3/uL (ref 0.7–4.0)
MCHC: 33.8 g/dL (ref 30.0–36.0)
MCV: 97.8 fl (ref 78.0–100.0)
MONOS PCT: 7.9 % (ref 3.0–12.0)
Monocytes Absolute: 0.5 10*3/uL (ref 0.1–1.0)
NEUTROS PCT: 70.5 % (ref 43.0–77.0)
Neutro Abs: 4.1 10*3/uL (ref 1.4–7.7)
Platelets: 263 10*3/uL (ref 150.0–400.0)
RBC: 4.35 Mil/uL (ref 3.87–5.11)
RDW: 13.7 % (ref 11.5–15.5)
WBC: 5.8 10*3/uL (ref 4.0–10.5)

## 2015-05-11 LAB — LIPID PANEL
CHOL/HDL RATIO: 3
Cholesterol: 182 mg/dL (ref 0–200)
HDL: 53.7 mg/dL (ref >39.00)
LDL Cholesterol: 111 mg/dL — ABNORMAL HIGH (ref 0–99)
NonHDL: 128.3
TRIGLYCERIDES: 85 mg/dL (ref 0.0–149.0)
VLDL: 17 mg/dL (ref 0.0–40.0)

## 2015-05-11 LAB — COMPREHENSIVE METABOLIC PANEL
ALT: 12 U/L (ref 0–35)
AST: 20 U/L (ref 0–37)
Albumin: 4.2 g/dL (ref 3.5–5.2)
Alkaline Phosphatase: 65 U/L (ref 39–117)
BUN: 13 mg/dL (ref 6–23)
CALCIUM: 9.6 mg/dL (ref 8.4–10.5)
CO2: 29 meq/L (ref 19–32)
Chloride: 104 mEq/L (ref 96–112)
Creatinine, Ser: 0.71 mg/dL (ref 0.40–1.20)
GFR: 87.33 mL/min (ref >60.00)
Glucose, Bld: 101 mg/dL — ABNORMAL HIGH (ref 70–99)
POTASSIUM: 4 meq/L (ref 3.5–5.1)
SODIUM: 138 meq/L (ref 135–145)
TOTAL PROTEIN: 7.1 g/dL (ref 6.0–8.3)
Total Bilirubin: 0.5 mg/dL (ref 0.2–1.2)

## 2015-05-11 LAB — TSH: TSH: 0.96 u[IU]/mL (ref 0.35–4.50)

## 2015-05-11 NOTE — Addendum Note (Signed)
Addended by: Marchia Bond on: 05/11/2015 09:28 AM   Modules accepted: Orders

## 2015-05-12 ENCOUNTER — Other Ambulatory Visit: Payer: Medicare Other

## 2015-05-17 ENCOUNTER — Encounter: Payer: Self-pay | Admitting: Family Medicine

## 2015-05-17 ENCOUNTER — Ambulatory Visit (INDEPENDENT_AMBULATORY_CARE_PROVIDER_SITE_OTHER): Payer: Medicare Other | Admitting: Family Medicine

## 2015-05-17 VITALS — BP 134/84 | HR 76 | Temp 97.9°F | Ht 65.0 in | Wt 157.2 lb

## 2015-05-17 DIAGNOSIS — Z720 Tobacco use: Secondary | ICD-10-CM | POA: Diagnosis not present

## 2015-05-17 DIAGNOSIS — F17200 Nicotine dependence, unspecified, uncomplicated: Secondary | ICD-10-CM

## 2015-05-17 DIAGNOSIS — Z23 Encounter for immunization: Secondary | ICD-10-CM

## 2015-05-17 DIAGNOSIS — E785 Hyperlipidemia, unspecified: Secondary | ICD-10-CM

## 2015-05-17 DIAGNOSIS — I10 Essential (primary) hypertension: Secondary | ICD-10-CM | POA: Diagnosis not present

## 2015-05-17 DIAGNOSIS — M949 Disorder of cartilage, unspecified: Secondary | ICD-10-CM

## 2015-05-17 DIAGNOSIS — Z0000 Encounter for general adult medical examination without abnormal findings: Secondary | ICD-10-CM

## 2015-05-17 DIAGNOSIS — Z1211 Encounter for screening for malignant neoplasm of colon: Secondary | ICD-10-CM

## 2015-05-17 DIAGNOSIS — M899 Disorder of bone, unspecified: Secondary | ICD-10-CM

## 2015-05-17 MED ORDER — ESCITALOPRAM OXALATE 10 MG PO TABS: ORAL_TABLET | ORAL | Status: DC

## 2015-05-17 MED ORDER — FLUTICASONE PROPIONATE 50 MCG/ACT NA SUSP: 2.0000 | NASAL | Status: DC | PRN

## 2015-05-17 MED ORDER — HYDROCHLOROTHIAZIDE 25 MG PO TABS: ORAL_TABLET | ORAL | Status: DC

## 2015-05-17 NOTE — Assessment & Plan Note (Signed)
Osteopenia dexa 2011 Intol of evista (breast tenderness) and fosamax (GI) On ca and D Walking No falls or fx  Disc need for calcium/ vitamin D/ wt bearing exercise and bone density test every 2 y to monitor Disc safety/ fracture risk in detail   Pt declines dexa this year  Enc to quit smoking

## 2015-05-17 NOTE — Progress Notes (Signed)
Pre visit review using our clinic review tool, if applicable. No additional management support is needed unless otherwise documented below in the visit note. 

## 2015-05-17 NOTE — Assessment & Plan Note (Signed)
bp in fair control at this time  BP Readings from Last 1 Encounters:  05/17/15 134/84   No changes needed Disc lifstyle change with low sodium diet and exercise  Labs reviewed  Disc walking for exercise and avoidance of processed foods  Also enc to consider smoking cessation

## 2015-05-17 NOTE — Patient Instructions (Signed)
Don't forget to schedule your colonoscopy  Blood pressure and labs are stable prevnar vaccine today  Keep thinking about quitting smoking Take your calcium and vit D and walk

## 2015-05-17 NOTE — Assessment & Plan Note (Signed)
LDL is improved Disc goals for lipids and reasons to control them Rev labs with pt Rev low sat fat diet in detail Pt does not tolerate statins unfortunately

## 2015-05-17 NOTE — Assessment & Plan Note (Signed)
Disc in detail risks of smoking and possible outcomes including copd, vascular/ heart disease, cancer , respiratory and sinus infections  Pt voices understanding Pt is not yet ready to quit  May consider use of nicotine gum if she does

## 2015-05-17 NOTE — Assessment & Plan Note (Signed)
Reviewed health habits including diet and exercise and skin cancer prevention Reviewed appropriate screening tests for age  Also reviewed health mt list, fam hx and immunization status , as well as social and family history   See HPI Labs reviewed for wellness Pt will schedule her own screening colonoscopy prevnar vaccine today Enc to quit smoking Declines Hep C screen and dexa this year

## 2015-05-17 NOTE — Assessment & Plan Note (Signed)
Pt is going to schedule her own screening colonosc (last 04)

## 2015-05-17 NOTE — Progress Notes (Signed)
Subjective:    Patient ID: Erin Hinton, female    DOB: 1946-07-10, 68 y.o.   MRN: [id number]  HPI Here for annual medicare wellness visit as well as chronic/acute medical problems   I have personally reviewed the Medicare Annual Wellness questionnaire and have noted 1. The patient's medical and social history 2. Their use of alcohol, tobacco or illicit drugs 3. Their current medications and supplements 4. The patient's functional ability including ADL's, fall risks, home safety risks and hearing or visual             impairment. 5. Diet and physical activities 6. Evidence for depression or mood disorders  The patients weight, height, BMI have been recorded in the chart and visual acuity is per eye clinic.  I have made referrals, counseling and provided education to the patient based review of the above and I have provided the pt with a written personalized care plan for preventive services. Reviewed and updated provider list, see scanned forms.  Has been feeling ok  Allergies lately - sinus symptoms   Wt up 7 lb with bmi of 26   See scanned forms.  Routine anticipatory guidance given to patient.  See health maintenance. Colon cancer screening colonoscopy 04 and ifob 2015 -- is going to schedule a colonoscopy  Breast cancer screening mammogram 12/15 - ok  Self breast exam - no lumps or changes  No gyn problems, does not see a gyn  Flu vaccine 9/15 Tetanus vaccine 9/10  Pneumovax 4/15- - will get prevnar today  Zoster vaccine 12/11  dexa 12/11 osteopenia - GI side eff with fosamax in the past, and also evista (breast pain), no fractures or falls , takes ca and D-not ready to schedule dexa  Advance directive - has a living will and POA written up  Cognitive function addressed- see scanned forms- and if abnormal then additional documentation follows.  No major concerns - reads and does puzzles   PMH and SH reviewed  Meds, vitals, and allergies reviewed.   ROS: See HPI.   Otherwise negative.     bp is stable today  No cp or palpitations or headaches or edema  No side effects to medicines  BP Readings from Last 3 Encounters:  05/17/15 134/84  04/07/14 122/76  03/09/13 140/84    Still walks with friends in the neighborhood   Smoking status-no change  No plans to quit - she has thought about it / may try nicotine gum in the future when ready   Hyperlipidemia Lab Results  Component Value Date   CHOL 182 05/11/2015   CHOL 211* 04/01/2014   CHOL 191 03/09/2013   Lab Results  Component Value Date   HDL 53.70 05/11/2015   HDL 57.00 04/01/2014   HDL 54.20 03/09/2013   Lab Results  Component Value Date   LDLCALC 111* 05/11/2015   LDLCALC 137* 04/01/2014   LDLCALC 120* 03/09/2013   Lab Results  Component Value Date   TRIG 85.0 05/11/2015   TRIG 84.0 04/01/2014   TRIG 83.0 03/09/2013   Lab Results  Component Value Date   CHOLHDL 3 05/11/2015   CHOLHDL 4 04/01/2014   CHOLHDL 4 03/09/2013   Lab Results  Component Value Date   LDLDIRECT 128.6 11/28/2010   LDLDIRECT 152.1 11/24/2009   LDLDIRECT 138.9 11/23/2008    Is watching diet- her LDL is down  Overall improved  Intol of statin med     Chemistry      Component Value  Date/Time   NA 138 05/11/2015 0928   K 4.0 05/11/2015 0928   CL 104 05/11/2015 0928   CO2 29 05/11/2015 0928   BUN 13 05/11/2015 0928   CREATININE 0.71 05/11/2015 0928      Component Value Date/Time   CALCIUM 9.6 05/11/2015 0928   ALKPHOS 65 05/11/2015 0928   AST 20 05/11/2015 0928   ALT 12 05/11/2015 0928   BILITOT 0.5 05/11/2015 0928      Lab Results  Component Value Date   TSH 0.96 05/11/2015    Lab Results  Component Value Date   WBC 5.8 05/11/2015   HGB 14.4 05/11/2015   HCT 42.5 05/11/2015   MCV 97.8 05/11/2015   PLT 263.0 05/11/2015      Patient Active Problem List   Diagnosis Date Noted  . Encounter for Medicare annual wellness exam 04/07/2014  . Colon cancer screening 03/09/2013  .  Routine general medical examination at a health care facility 01/30/2012  . HYPERTENSION, BENIGN ESSENTIAL 02/01/2010  . Disorder of bone and cartilage 11/23/2008  . TOBACCO USE 10/21/2007  . Hyperlipidemia 09/29/2007  . MITRAL VALVE PROLAPSE 09/29/2007  . 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 ALLERGY 09/29/2007   Past Medical History  Diagnosis Date  . Hyperlipidemia   . Osteopenia   . Hypertension    Past Surgical History  Procedure Laterality Date  . 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 Breast biopsy  01/2002    neg   History  Substance Use Topics  . Smoking status: Current Every Day Smoker -- 0.50 packs/day    Types: Cigarettes  . Smokeless tobacco: Not on file     Comment: also uses e-cigaretts  . Alcohol Use: No   Family History  Problem Relation Age of Onset  . Lymphoma Father   . Heart disease Father     MI  . Hypertension Father   . Alcohol abuse Brother   . Cancer Maternal Aunt     breast cancer   Allergies  Allergen Reactions  . Alendronate Sodium     REACTION: GI  . Atorvastatin     REACTION: Psin  . Pseudoephedrine     REACTION: palpitations  . Sertraline Hcl     REACTION: ? reaction   Current Outpatient Prescriptions on File Prior to Visit  Medication Sig Dispense Refill  . escitalopram (LEXAPRO) 10 MG tablet TAKE 1 TABLET BY MOUTH EVERY DAY 90 tablet 3  . fluticasone (FLONASE) 50 MCG/ACT nasal spray Place 2 sprays into the nose as needed. 16 g 11  . hydrochlorothiazide (HYDRODIURIL) 25 MG tablet TAKE 1 TABLET BY MOUTH DAILY. 90 tablet 3  . Omega-3 Fatty Acids (FISH OIL PO) Take 1 tablet by mouth 2 (two) times daily. Rotates with Flaxseed oil tabs    . vitamin E 1000 UNIT capsule Take 1,000 Units by mouth daily.     No current facility-administered medications on file prior to visit.      Review of Systems    Review of Systems  Constitutional: Negative for fever, appetite change, fatigue and unexpected weight change.  Eyes:  Negative for pain and visual disturbance.  Respiratory: Negative for cough and shortness of breath.   Cardiovascular: Negative for cp or palpitations    Gastrointestinal: Negative for nausea, diarrhea and constipation.  Genitourinary: Negative for urgency and frequency.  Skin: Negative for pallor or rash   Neurological: Negative for weakness, light-headedness, numbness and headaches.  Hematological: Negative for adenopathy.  Does not bruise/bleed easily.  Psychiatric/Behavioral: Negative for dysphoric mood. The patient is not nervous/anxious.      Objective:   Physical Exam  Constitutional: She appears well-developed and well-nourished. No distress.  Well appearing   HENT:  Head: Normocephalic and atraumatic.  Right Ear: External ear normal.  Left Ear: External ear normal.  Mouth/Throat: Oropharynx is clear and moist.  Nares are boggy  Eyes: Conjunctivae and EOM are normal. Pupils are equal, round, and reactive to light. No scleral icterus.  Neck: Normal range of motion. Neck supple. No JVD present. Carotid bruit is not present. No thyromegaly present.  Cardiovascular: Normal rate, regular rhythm, normal heart sounds and intact distal pulses.  Exam reveals no gallop.   Pulmonary/Chest: Effort normal and breath sounds normal. No respiratory distress. She has no wheezes. She exhibits no tenderness.  bs are slt distant, no wheeze   Abdominal: Soft. Bowel sounds are normal. She exhibits no distension, no abdominal bruit and no mass. There is no tenderness.  Genitourinary: No breast swelling, tenderness, discharge or bleeding.  Breast exam: No mass, nodules, thickening, tenderness, bulging, retraction, inflamation, nipple discharge or skin changes noted.  No axillary or clavicular LA.      Musculoskeletal: Normal range of motion. She exhibits no edema or tenderness.  Lymphadenopathy:    She has no cervical adenopathy.  Neurological: She is alert. She has normal reflexes. No cranial nerve  deficit. She exhibits normal muscle tone. Coordination normal.  Skin: Skin is warm and dry. No rash noted. No erythema. No pallor.  Lentigo and solar aging diffusely  Baseline seb cyst on back of neck R side   Psychiatric: She has a normal mood and affect.  Cheerful           Assessment & Plan:   Problem List Items Addressed This Visit    Colon cancer screening    Pt is going to schedule her own screening colonosc (last 04)      Disorder of bone and cartilage    Osteopenia dexa 2011 Intol of evista (breast tenderness) and fosamax (GI) On ca and D Walking No falls or fx  Disc need for calcium/ vitamin D/ wt bearing exercise and bone density test every 2 y to monitor Disc safety/ fracture risk in detail   Pt declines dexa this year  Enc to quit smoking       Encounter for Medicare annual wellness exam    Reviewed health habits including diet and exercise and skin cancer prevention Reviewed appropriate screening tests for age  Also reviewed health mt list, fam hx and immunization status , as well as social and family history   See HPI Labs reviewed for wellness Pt will schedule her own screening colonoscopy prevnar vaccine today Enc to quit smoking Declines Hep C screen and dexa this year        Hyperlipidemia    LDL is improved Disc goals for lipids and reasons to control them Rev labs with pt Rev low sat fat diet in detail Pt does not tolerate statins unfortunately        Relevant Medications   hydrochlorothiazide (HYDRODIURIL) 25 MG tablet   HYPERTENSION, BENIGN ESSENTIAL - Primary    bp in fair control at this time  BP Readings from Last 1 Encounters:  05/17/15 134/84   No changes needed Disc lifstyle change with low sodium diet and exercise  Labs reviewed  Disc walking for exercise and avoidance of processed foods  Also enc to consider  smoking cessation       Relevant Medications   hydrochlorothiazide (HYDRODIURIL) 25 MG tablet   TOBACCO USE      Disc in detail risks of smoking and possible outcomes including copd, vascular/ heart disease, cancer , respiratory and sinus infections  Pt voices understanding Pt is not yet ready to quit  May consider use of nicotine gum if she does        Other Visit Diagnoses    Need for vaccination with 13-polyvalent pneumococcal conjugate vaccine        Relevant Orders    Pneumococcal conjugate vaccine 13-valent

## 2015-06-23 DIAGNOSIS — H2513 Age-related nuclear cataract, bilateral: Secondary | ICD-10-CM | POA: Diagnosis not present

## 2015-09-20 ENCOUNTER — Telehealth: Payer: Self-pay | Admitting: Family Medicine

## 2015-09-20 DIAGNOSIS — Z1211 Encounter for screening for malignant neoplasm of colon: Secondary | ICD-10-CM

## 2015-09-20 NOTE — Telephone Encounter (Signed)
I did the order and will route to [facility]

## 2015-09-20 NOTE — Telephone Encounter (Signed)
Referral request faxed to Dr Candace Cruise at [facility], waiting for appt top be made.

## 2015-09-20 NOTE — Telephone Encounter (Signed)
Pt would like referral to Dr Candace Cruise for a colonoscpoy.  [phone] is number to office Pt call back number is [phone]

## 2015-09-21 DIAGNOSIS — Z23 Encounter for immunization: Secondary | ICD-10-CM | POA: Diagnosis not present

## 2015-10-26 ENCOUNTER — Other Ambulatory Visit: Payer: Self-pay

## 2015-10-26 DIAGNOSIS — Z1231 Encounter for screening mammogram for malignant neoplasm of breast: Secondary | ICD-10-CM

## 2015-11-18 ENCOUNTER — Encounter: Payer: Self-pay | Admitting: *Deleted

## 2015-11-21 ENCOUNTER — Ambulatory Visit: Payer: Medicare Other | Admitting: Anesthesiology

## 2015-11-21 ENCOUNTER — Encounter
Admission: RE | Disposition: A | Discharge status: Home Health | Payer: Self-pay | Source: Ambulatory Visit | Attending: Gastroenterology

## 2015-11-21 ENCOUNTER — Ambulatory Visit
Admission: RE | Admit: 2015-11-21 | Discharge: 2015-11-21 | Disposition: A | Discharge status: Home Health | Payer: Medicare Other | Source: Ambulatory Visit | Attending: Gastroenterology | Admitting: Gastroenterology

## 2015-11-21 DIAGNOSIS — Z7982 Long term (current) use of aspirin: Secondary | ICD-10-CM | POA: Diagnosis not present

## 2015-11-21 DIAGNOSIS — Z1211 Encounter for screening for malignant neoplasm of colon: Secondary | ICD-10-CM | POA: Insufficient documentation

## 2015-11-21 DIAGNOSIS — Z79899 Other long term (current) drug therapy: Secondary | ICD-10-CM | POA: Insufficient documentation

## 2015-11-21 DIAGNOSIS — F329 Major depressive disorder, single episode, unspecified: Secondary | ICD-10-CM | POA: Diagnosis not present

## 2015-11-21 HISTORY — PX: COLONOSCOPY WITH PROPOFOL: SHX5780

## 2015-11-21 HISTORY — DX: Major depressive disorder, single episode, unspecified: F32.9

## 2015-11-21 HISTORY — DX: Depression, unspecified: F32.A

## 2015-11-21 SURGERY — COLONOSCOPY WITH PROPOFOL
Anesthesia: General

## 2015-11-21 MED ORDER — PROPOFOL 10 MG/ML IV BOLUS
INTRAVENOUS | Status: DC | PRN
  Administered 2015-11-21: 10 mg via INTRAVENOUS
  Administered 2015-11-21: 20 mg via INTRAVENOUS

## 2015-11-21 MED ORDER — PROPOFOL 500 MG/50ML IV EMUL
INTRAVENOUS | Status: DC | PRN
  Administered 2015-11-21: 70 ug/kg/min via INTRAVENOUS

## 2015-11-21 MED ORDER — SODIUM CHLORIDE 0.9 % IV SOLN
INTRAVENOUS | Status: DC
  Administered 2015-11-21: 1000 mL via INTRAVENOUS

## 2015-11-21 MED ORDER — FENTANYL CITRATE (PF) 100 MCG/2ML IJ SOLN
INTRAMUSCULAR | Status: DC | PRN
  Administered 2015-11-21: 50 ug via INTRAVENOUS

## 2015-11-21 MED ORDER — MIDAZOLAM HCL 2 MG/2ML IJ SOLN
INTRAMUSCULAR | Status: DC | PRN
  Administered 2015-11-21: 1 mg via INTRAVENOUS

## 2015-11-21 NOTE — H&P (Signed)
Primary Care Physician:  Loura Pardon, MD Primary Gastroenterologist:  Dr. Candace Cruise  Pre-Procedure History & Physical: HPI:  [name] is a 69 y.o. female is here for an colonoscopy.   Past Medical History  Diagnosis Date  . Hyperlipidemia   . Osteopenia   . Hypertension   . Hyperlipidemia     Past Surgical History  Procedure Laterality Date  . 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 Breast biopsy  01/2002    neg    Prior to Admission medications   Medication Sig Start Date End Date Taking? Authorizing Provider  Calcium Carb-Cholecalciferol (CALCIUM 600 + D PO) Take 1 capsule by mouth daily.    Historical Provider, MD  escitalopram (LEXAPRO) 10 MG tablet TAKE 1 TABLET BY MOUTH EVERY DAY 05/17/15   Abner Greenspan, MD  fluticasone [place]) 50 MCG/ACT nasal spray Place 2 sprays into both nostrils as needed. 05/17/15 07/24/18  Abner Greenspan, MD  hydrochlorothiazide (HYDRODIURIL) 25 MG tablet TAKE 1 TABLET BY MOUTH DAILY. 05/17/15   Abner Greenspan, MD  Omega-3 Fatty Acids (FISH OIL PO) Take 1 tablet by mouth 2 (two) times daily. Rotates with Flaxseed oil tabs    Historical Provider, MD  vitamin C (ASCORBIC ACID) 500 MG tablet Take 500 mg by mouth daily.    Historical Provider, MD  Vitamin D, Cholecalciferol, 1000 UNITS CAPS Take 1 capsule by mouth daily.    Historical Provider, MD  vitamin E 1000 UNIT capsule Take 1,000 Units by mouth daily.    Historical Provider, MD    Allergies as of 11/15/2015 - Review Complete 05/17/2015  Allergen Reaction Noted  . Alendronate sodium  09/29/2007  . Atorvastatin  09/29/2007  . Evista [raloxifene] Other (See Comments) 05/17/2015  . Pseudoephedrine  09/29/2007  . Sertraline hcl  09/29/2007    Family History  Problem Relation Age of Onset  . Lymphoma Father   . Heart disease Father     MI  . Hypertension Father   . Alcohol abuse Brother   . Cancer Maternal Aunt     breast cancer    Social History   Social History  .  Marital Status: Widowed    Spouse Name: N/A  . Number of Children: N/A  . Years of Education: N/A   Occupational History  . Not on file.   Social History Main Topics  . Smoking status: Current Every Day Smoker -- 0.50 packs/day    Types: Cigarettes  . Smokeless tobacco: Not on file     Comment: also uses e-cigaretts  . Alcohol Use: No  . Drug Use: No  . Sexual Activity: Not on file   Other Topics Concern  . Not on file   Social History Narrative    Review of Systems: See HPI, otherwise negative ROS  Physical Exam: There were no vitals taken for this visit. General:   Alert,  pleasant and cooperative in NAD Head:  Normocephalic and atraumatic. Neck:  Supple; no masses or thyromegaly. Lungs:  Clear throughout to auscultation.    Heart:  Regular rate and rhythm. Abdomen:  Soft, nontender and nondistended. Normal bowel sounds, without guarding, and without rebound.   Neurologic:  Alert and  oriented x4;  grossly normal neurologically.  Impression/Plan: Erin Hinton is here for an colonoscopy to be performed for screening  Risks, benefits, limitations, and alternatives regarding colonoscopy have been reviewed with the patient.  Questions have been answered.  All parties  agreeable.   Joyce Heitman, Lupita Dawn, MD  11/21/2015, 10:26 AM

## 2015-11-21 NOTE — Transfer of Care (Signed)
Immediate Anesthesia Transfer of Care Note  Patient: EMSLEE Hinton  Procedure(s) Performed: Procedure(s): COLONOSCOPY WITH PROPOFOL (N/A)  Patient Location: PACU  Anesthesia Type:General  Level of Consciousness: awake, alert  and oriented  Airway & Oxygen Therapy: Patient Spontanous Breathing and Patient connected to nasal cannula oxygen  Post-op Assessment: Report given to RN and Post -op Vital signs reviewed and stable  Post vital signs: Reviewed and stable  Last Vitals:  Filed Vitals:   11/21/15 1045  BP: 134/90  Pulse: 88  Temp: 36.5 C  Resp: 17    Complications: No apparent anesthesia complications

## 2015-11-21 NOTE — Op Note (Signed)
[facility] Patient Name: Erin Hinton Procedure Date: 11/21/2015 11:43 AM MRN: [id number] Account #: [id number] Date of Birth: 1946-06-16 Admit Type: Outpatient Age: 69 Room: [facility] ROOM 4 Gender: Female Note Status: Finalized Procedure:         Colonoscopy Indications:       Screening for colorectal malignant neoplasm Providers:         Lupita Dawn. Candace Cruise, MD Referring MD:      Wynelle Fanny Tower, MD (Referring MD) Medicines:         Monitored Anesthesia Care Complications:     No immediate complications. Procedure:         Pre-Anesthesia Assessment:                    - Prior to the procedure, a History and Physical was                     performed, and patient medications, allergies and                     sensitivities were reviewed. The patient's tolerance of                     previous anesthesia was reviewed.                    - The risks and benefits of the procedure and the sedation                     options and risks were discussed with the patient. All                     questions were answered and informed consent was obtained.                    - After reviewing the risks and benefits, the patient was                     deemed in satisfactory condition to undergo the procedure.                    After obtaining informed consent, the colonoscope was                     passed under direct vision. Throughout the procedure, the                     patient's blood pressure, pulse, and oxygen saturations                     were monitored continuously. The Olympus PCF-H180AL                     colonoscope ( S#: [id number] ) was introduced through the                     anus and advanced to the the cecum, identified by                     appendiceal orifice and ileocecal valve. The colonoscopy                     was performed without difficulty. The patient tolerated  the procedure well. The quality of the bowel  preparation                     was good. Findings:      The colon (entire examined portion) appeared normal. Impression:        - The entire examined colon is normal.                    - No specimens collected. Recommendation:    - Discharge patient to home.                    - Repeat colonoscopy in 10 years for surveillance.                    - The findings and recommendations were discussed with the                     patient. Procedure Code(s): --- Professional ---                    [phone], Colonoscopy, flexible; diagnostic, including                     collection of specimen(s) by brushing or washing, when                     performed (separate procedure) Diagnosis Code(s): --- Professional ---                    Z12.11, Encounter for screening for malignant neoplasm of                     colon CPT copyright 2014 American Medical Association. All rights reserved. The codes documented in this report are preliminary and upon coder review may  be revised to meet current compliance requirements. Hulen Luster, MD 11/21/2015 12:02:48 PM This report has been signed electronically. Number of Addenda: 0 Note Initiated On: 11/21/2015 11:43 AM Scope Withdrawal Time: 0 hours 7 minutes 34 seconds  Total Procedure Duration: 0 hours 11 minutes 52 seconds       [facility]

## 2015-11-21 NOTE — Anesthesia Preprocedure Evaluation (Signed)
Anesthesia Evaluation  Patient identified by MRN, date of birth, ID band Patient awake    Reviewed: Allergy & Precautions, H&P , NPO status , Patient's Chart, lab work & pertinent test results, reviewed documented beta blocker date and time   History of Anesthesia Complications Negative for: history of anesthetic complications  Airway Mallampati: III  TM Distance: >3 FB Neck ROM: full    Dental no notable dental hx. (+) Caps, Teeth Intact   Pulmonary neg shortness of breath, neg sleep apnea, neg COPD, neg recent URI, Current Smoker,    Pulmonary exam normal breath sounds clear to auscultation       Cardiovascular Exercise Tolerance: Good hypertension, On Medications (-) angina(-) CAD, (-) Past MI, (-) Cardiac Stents and (-) CABG Normal cardiovascular exam(-) dysrhythmias (-) Valvular Problems/Murmurs Rhythm:regular Rate:Normal     Neuro/Psych PSYCHIATRIC DISORDERS (Depression) negative neurological ROS     GI/Hepatic negative GI ROS, Neg liver ROS,   Endo/Other  negative endocrine ROS  Renal/GU negative Renal ROS  negative genitourinary   Musculoskeletal   Abdominal   Peds  Hematology negative hematology ROS (+)   Anesthesia Other Findings Past Medical History:   Hyperlipidemia                                               Osteopenia                                                   Hypertension                                                 Hyperlipidemia                                               Depression                                                   Reproductive/Obstetrics negative OB ROS                             Anesthesia Physical Anesthesia Plan  ASA: II  Anesthesia Plan: General   Post-op Pain Management:    Induction:   Airway Management Planned:   Additional Equipment:   Intra-op Plan:   Post-operative Plan:   Informed Consent: I have reviewed the  patients History and Physical, chart, labs and discussed the procedure including the risks, benefits and alternatives for the proposed anesthesia with the patient or authorized representative who has indicated his/her understanding and acceptance.   Dental Advisory Given  Plan Discussed with: Anesthesiologist, CRNA and Surgeon  Anesthesia Plan Comments:         Anesthesia Quick Evaluation

## 2015-11-21 NOTE — Anesthesia Postprocedure Evaluation (Signed)
Anesthesia Post Note  Patient: Erin Hinton  Procedure(s) Performed: Procedure(s) (LRB): COLONOSCOPY WITH PROPOFOL (N/A)  Patient location during evaluation: Endoscopy Anesthesia Type: General Level of consciousness: awake and alert Pain management: pain level controlled Vital Signs Assessment: post-procedure vital signs reviewed and stable Respiratory status: spontaneous breathing, nonlabored ventilation, respiratory function stable and patient connected to nasal cannula oxygen Cardiovascular status: blood pressure returned to baseline and stable Postop Assessment: no signs of nausea or vomiting Anesthetic complications: no    Last Vitals:  Filed Vitals:   11/21/15 1226 11/21/15 1236  BP: 119/72 127/71  Pulse: 61 62  Temp:    Resp: 19 23    Last Pain: There were no vitals filed for this visit.               Martha Clan

## 2015-12-08 ENCOUNTER — Ambulatory Visit
Admission: RE | Admit: 2015-12-08 | Discharge: 2015-12-08 | Disposition: A | Discharge status: Home / Self Care | Payer: Medicare Other | Source: Ambulatory Visit

## 2015-12-08 DIAGNOSIS — Z1231 Encounter for screening mammogram for malignant neoplasm of breast: Secondary | ICD-10-CM

## 2016-02-09 DIAGNOSIS — D485 Neoplasm of uncertain behavior of skin: Secondary | ICD-10-CM | POA: Diagnosis not present

## 2016-02-09 DIAGNOSIS — D0461 Carcinoma in situ of skin of right upper limb, including shoulder: Secondary | ICD-10-CM | POA: Diagnosis not present

## 2016-02-09 DIAGNOSIS — X32XXXA Exposure to sunlight, initial encounter: Secondary | ICD-10-CM | POA: Diagnosis not present

## 2016-02-09 DIAGNOSIS — D045 Carcinoma in situ of skin of trunk: Secondary | ICD-10-CM | POA: Diagnosis not present

## 2016-02-09 DIAGNOSIS — L821 Other seborrheic keratosis: Secondary | ICD-10-CM | POA: Diagnosis not present

## 2016-02-09 DIAGNOSIS — L720 Epidermal cyst: Secondary | ICD-10-CM | POA: Diagnosis not present

## 2016-02-09 DIAGNOSIS — L570 Actinic keratosis: Secondary | ICD-10-CM | POA: Diagnosis not present

## 2016-02-27 DIAGNOSIS — D045 Carcinoma in situ of skin of trunk: Secondary | ICD-10-CM | POA: Diagnosis not present

## 2016-02-27 DIAGNOSIS — D0461 Carcinoma in situ of skin of right upper limb, including shoulder: Secondary | ICD-10-CM | POA: Diagnosis not present

## 2016-03-09 DIAGNOSIS — H40039 Anatomical narrow angle, unspecified eye: Secondary | ICD-10-CM | POA: Diagnosis not present

## 2016-04-16 ENCOUNTER — Telehealth: Payer: Self-pay | Admitting: Family Medicine

## 2016-04-16 NOTE — Telephone Encounter (Signed)
LM for pt to sch AWV on 7/7 at 8:15 in addition to labs, instead of existing lab appt on 7/6 at 8:30. mn

## 2016-04-20 DIAGNOSIS — H2511 Age-related nuclear cataract, right eye: Secondary | ICD-10-CM | POA: Diagnosis not present

## 2016-04-23 ENCOUNTER — Encounter: Payer: Self-pay | Admitting: *Deleted

## 2016-05-01 ENCOUNTER — Ambulatory Visit
Admission: RE | Admit: 2016-05-01 | Discharge: 2016-05-01 | Disposition: A | Discharge status: Home / Self Care | Payer: PPO | Source: Ambulatory Visit | Attending: Ophthalmology | Admitting: Ophthalmology

## 2016-05-01 ENCOUNTER — Encounter
Admission: RE | Disposition: A | Discharge status: Home / Self Care | Payer: Self-pay | Source: Ambulatory Visit | Attending: Ophthalmology

## 2016-05-01 ENCOUNTER — Encounter: Payer: Self-pay | Admitting: *Deleted

## 2016-05-01 ENCOUNTER — Ambulatory Visit: Payer: PPO | Admitting: Anesthesiology

## 2016-05-01 DIAGNOSIS — F329 Major depressive disorder, single episode, unspecified: Secondary | ICD-10-CM | POA: Insufficient documentation

## 2016-05-01 DIAGNOSIS — F17200 Nicotine dependence, unspecified, uncomplicated: Secondary | ICD-10-CM | POA: Insufficient documentation

## 2016-05-01 DIAGNOSIS — H2511 Age-related nuclear cataract, right eye: Secondary | ICD-10-CM | POA: Diagnosis not present

## 2016-05-01 DIAGNOSIS — I10 Essential (primary) hypertension: Secondary | ICD-10-CM | POA: Diagnosis not present

## 2016-05-01 DIAGNOSIS — R0602 Shortness of breath: Secondary | ICD-10-CM | POA: Diagnosis not present

## 2016-05-01 DIAGNOSIS — R011 Cardiac murmur, unspecified: Secondary | ICD-10-CM | POA: Insufficient documentation

## 2016-05-01 DIAGNOSIS — F419 Anxiety disorder, unspecified: Secondary | ICD-10-CM | POA: Insufficient documentation

## 2016-05-01 DIAGNOSIS — E785 Hyperlipidemia, unspecified: Secondary | ICD-10-CM | POA: Diagnosis not present

## 2016-05-01 DIAGNOSIS — M8580 Other specified disorders of bone density and structure, unspecified site: Secondary | ICD-10-CM | POA: Diagnosis not present

## 2016-05-01 DIAGNOSIS — Z85828 Personal history of other malignant neoplasm of skin: Secondary | ICD-10-CM | POA: Insufficient documentation

## 2016-05-01 DIAGNOSIS — F418 Other specified anxiety disorders: Secondary | ICD-10-CM | POA: Diagnosis not present

## 2016-05-01 HISTORY — DX: Malignant (primary) neoplasm, unspecified: C80.1

## 2016-05-01 HISTORY — DX: Cardiac murmur, unspecified: R01.1

## 2016-05-01 HISTORY — DX: Reserved for inherently not codable concepts without codable children: IMO0001

## 2016-05-01 HISTORY — DX: Anxiety disorder, unspecified: F41.9

## 2016-05-01 HISTORY — PX: CATARACT EXTRACTION W/PHACO: SHX586

## 2016-05-01 SURGERY — PHACOEMULSIFICATION, CATARACT, WITH IOL INSERTION
Anesthesia: General | Site: Eye | Laterality: Right | Wound class: Clean

## 2016-05-01 MED ORDER — EPINEPHRINE HCL 1 MG/ML IJ SOLN
INTRAOCULAR | Status: DC | PRN
  Administered 2016-05-01: 1 mL via OPHTHALMIC

## 2016-05-01 MED ORDER — EPINEPHRINE HCL 1 MG/ML IJ SOLN
INTRAMUSCULAR | Status: AC
  Filled 2016-05-01: qty 1

## 2016-05-01 MED ORDER — CARBACHOL 0.01 % IO SOLN
INTRAOCULAR | Status: DC | PRN
  Administered 2016-05-01: .5 mL via INTRAOCULAR

## 2016-05-01 MED ORDER — ARMC OPHTHALMIC DILATING GEL
OPHTHALMIC | Status: AC
  Administered 2016-05-01: 1 via OPHTHALMIC
  Filled 2016-05-01: qty 0.25

## 2016-05-01 MED ORDER — NA CHONDROIT SULF-NA HYALURON 40-17 MG/ML IO SOLN
INTRAOCULAR | Status: DC | PRN
  Administered 2016-05-01: 1 mL via INTRAOCULAR

## 2016-05-01 MED ORDER — SODIUM CHLORIDE 0.9 % IV SOLN
INTRAVENOUS | Status: DC
  Administered 2016-05-01 (×2): via INTRAVENOUS

## 2016-05-01 MED ORDER — TETRACAINE HCL 0.5 % OP SOLN
1.0000 [drp] | Freq: Once | OPHTHALMIC | Status: AC
  Administered 2016-05-01: 1 [drp] via OPHTHALMIC

## 2016-05-01 MED ORDER — MIDAZOLAM HCL 2 MG/2ML IJ SOLN
INTRAMUSCULAR | Status: DC | PRN
  Administered 2016-05-01: 1 mg via INTRAVENOUS

## 2016-05-01 MED ORDER — MOXIFLOXACIN HCL 0.5 % OP SOLN: 1.0000 [drp] | OPHTHALMIC | Status: DC | PRN

## 2016-05-01 MED ORDER — MOXIFLOXACIN HCL 0.5 % OP SOLN
OPHTHALMIC | Status: DC | PRN
  Administered 2016-05-01: 1 [drp] via OPHTHALMIC

## 2016-05-01 MED ORDER — CEFUROXIME OPHTHALMIC INJECTION 1 MG/0.1 ML
INJECTION | OPHTHALMIC | Status: AC
  Filled 2016-05-01: qty 0.1

## 2016-05-01 MED ORDER — POVIDONE-IODINE 5 % OP SOLN
1.0000 "application " | Freq: Once | OPHTHALMIC | Status: AC
  Administered 2016-05-01: 1 via OPHTHALMIC

## 2016-05-01 MED ORDER — FENTANYL CITRATE (PF) 100 MCG/2ML IJ SOLN
INTRAMUSCULAR | Status: DC | PRN
  Administered 2016-05-01: 50 ug via INTRAVENOUS

## 2016-05-01 MED ORDER — CEFUROXIME OPHTHALMIC INJECTION 1 MG/0.1 ML
INJECTION | OPHTHALMIC | Status: DC | PRN
  Administered 2016-05-01: .1 mL via INTRACAMERAL

## 2016-05-01 MED ORDER — POVIDONE-IODINE 5 % OP SOLN
OPHTHALMIC | Status: AC
  Administered 2016-05-01: 1 via OPHTHALMIC
  Filled 2016-05-01: qty 30

## 2016-05-01 MED ORDER — NA CHONDROIT SULF-NA HYALURON 40-17 MG/ML IO SOLN
INTRAOCULAR | Status: AC
  Filled 2016-05-01: qty 1

## 2016-05-01 MED ORDER — ARMC OPHTHALMIC DILATING GEL
1.0000 "application " | OPHTHALMIC | Status: DC | PRN
  Administered 2016-05-01: 1 via OPHTHALMIC

## 2016-05-01 MED ORDER — TETRACAINE HCL 0.5 % OP SOLN
OPHTHALMIC | Status: AC
  Administered 2016-05-01: 1 [drp] via OPHTHALMIC
  Filled 2016-05-01: qty 2

## 2016-05-01 MED ORDER — MOXIFLOXACIN HCL 0.5 % OP SOLN
OPHTHALMIC | Status: AC
  Filled 2016-05-01: qty 3

## 2016-05-01 SURGICAL SUPPLY — 21 items
CANNULA ANT/CHMB 27GA (MISCELLANEOUS) ×3 IMPLANT
CUP MEDICINE 2OZ PLAST GRAD ST (MISCELLANEOUS) ×3 IMPLANT
GLOVE BIO SURGEON STRL SZ8 (GLOVE) ×3 IMPLANT
GLOVE BIOGEL M 6.5 STRL (GLOVE) ×3 IMPLANT
GLOVE SURG LX 8.0 MICRO (GLOVE) ×2
GLOVE SURG LX STRL 8.0 MICRO (GLOVE) ×1 IMPLANT
GOWN STRL REUS W/ TWL LRG LVL3 (GOWN DISPOSABLE) ×2 IMPLANT
GOWN STRL REUS W/TWL LRG LVL3 (GOWN DISPOSABLE) ×4
LENS IOL TECNIS ITEC 23.0 (Intraocular Lens) ×3 IMPLANT
PACK CATARACT (MISCELLANEOUS) ×3 IMPLANT
PACK CATARACT BRASINGTON LX (MISCELLANEOUS) ×3 IMPLANT
PACK EYE AFTER SURG (MISCELLANEOUS) ×3 IMPLANT
SOL BSS BAG (MISCELLANEOUS) ×3
SOL PREP PVP 2OZ (MISCELLANEOUS) ×3
SOLUTION BSS BAG (MISCELLANEOUS) ×1 IMPLANT
SOLUTION PREP PVP 2OZ (MISCELLANEOUS) ×1 IMPLANT
SYR 3ML LL SCALE MARK (SYRINGE) ×3 IMPLANT
SYR 5ML LL (SYRINGE) ×3 IMPLANT
SYR TB 1ML 27GX1/2 LL (SYRINGE) ×3 IMPLANT
WATER STERILE IRR 1000ML POUR (IV SOLUTION) ×3 IMPLANT
WIPE NON LINTING 3.25X3.25 (MISCELLANEOUS) ×3 IMPLANT

## 2016-05-01 NOTE — Anesthesia Procedure Notes (Signed)
Procedure Name: MAC Date/Time: 05/01/2016 10:40 AM Performed by: Nelda Marseille Pre-anesthesia Checklist: Patient identified, Emergency Drugs available, Suction available, Patient being monitored and Timeout performed Oxygen Delivery Method: Nasal cannula

## 2016-05-01 NOTE — Transfer of Care (Signed)
Immediate Anesthesia Transfer of Care Note  Patient: Erin Hinton  Procedure(s) Performed: Procedure(s) with comments: CATARACT EXTRACTION PHACO AND INTRAOCULAR LENS PLACEMENT (IOC) (Right) - [place] 00:53 AP% 21.7 CDE 11.50 fluid pack lot # [id number]  Patient Location: PACU  Anesthesia Type:MAC  Level of Consciousness: awake, alert  and oriented  Airway & Oxygen Therapy: Patient Spontanous Breathing  Post-op Assessment: Report given to RN and Post -op Vital signs reviewed and stable  Post vital signs: Reviewed, stable and unstable  Last Vitals:  Filed Vitals:   05/01/16 0939  BP: 137/71  Pulse: 51  Temp: 36.7 C  Resp: 14    Last Pain: There were no vitals filed for this visit.       Complications: No apparent anesthesia complications

## 2016-05-01 NOTE — H&P (Signed)
  All labs reviewed. Abnormal studies sent to patients PCP when indicated.  Previous H&P reviewed, patient examined, there are NO CHANGES.  Erin Hinton LOUIS5/16/201710:29 AM

## 2016-05-01 NOTE — Op Note (Signed)
PREOPERATIVE DIAGNOSIS:  Nuclear sclerotic cataract of the right eye.   POSTOPERATIVE DIAGNOSIS: nuclear sclerotic cataract right eye   OPERATIVE PROCEDURE:  Procedure(s): CATARACT EXTRACTION PHACO AND INTRAOCULAR LENS PLACEMENT (IOC)   SURGEON:  Birder Robson, MD.   ANESTHESIA:  Anesthesiologist: Gunnar Bulla, MD CRNA: Nelda Marseille, CRNA  1.      Managed anesthesia care. 2.      Topical tetracaine drops followed by 2% Xylocaine jelly applied in the preoperative holding area.   COMPLICATIONS:  None.   TECHNIQUE:   Stop and chop   DESCRIPTION OF PROCEDURE:  The patient was examined and consented in the preoperative holding area where the aforementioned topical anesthesia was applied to the right eye and then brought back to the Operating Room where the right eye was prepped and draped in the usual sterile ophthalmic fashion and a lid speculum was placed. A paracentesis was created with the side port blade and the anterior chamber was filled with viscoelastic. A near clear corneal incision was performed with the steel keratome. A continuous curvilinear capsulorrhexis was performed with a cystotome followed by the capsulorrhexis forceps. Hydrodissection and hydrodelineation were carried out with BSS on a blunt cannula. The lens was removed in a stop and chop  technique and the remaining cortical material was removed with the irrigation-aspiration handpiece. The capsular bag was inflated with viscoelastic and the Technis ZCB00  lens was placed in the capsular bag without complication. The remaining viscoelastic was removed from the eye with the irrigation-aspiration handpiece. The wounds were hydrated. The anterior chamber was flushed with Miostat and the eye was inflated to physiologic pressure. 0.1 mL of cefuroxime concentration 10 mg/mL was placed in the anterior chamber. The wounds were found to be water tight. The eye was dressed with Vigamox. The patient was given protective glasses to wear  throughout the day and a shield with which to sleep tonight. The patient was also given drops with which to begin a drop regimen today and will follow-up with me in one day.  Implant Name Type Inv. Item Serial No. Manufacturer Lot No. LRB No. Used  LENS IOL DIOP 23.0 - [id number] Intraocular Lens LENS IOL DIOP 23.0 [id number]   Right 1   Procedure(s) with comments: CATARACT EXTRACTION PHACO AND INTRAOCULAR LENS PLACEMENT (IOC) (Right) - [place] 00:53 AP% 21.7 CDE 11.50 fluid pack lot # [id number]  Electronically signed: [place] 05/01/2016 10:55 AM

## 2016-05-01 NOTE — Discharge Instructions (Signed)
Eye Surgery Discharge Instructions  Expect mild scratchy sensation or mild soreness. DO NOT RUB YOUR EYE!  The day of surgery:  Minimal physical activity, but bed rest is not required  No reading, computer work, or close hand work  No bending, lifting, or straining.  May watch TV  For 24 hours:  No driving, legal decisions, or alcoholic beverages  Safety precautions  Eat anything you prefer: It is better to start with liquids, then soup then solid foods.  _____ Eye patch should be worn until postoperative exam tomorrow.  ____ Solar shield eyeglasses should be worn for comfort in the sunlight/patch while sleeping  Resume all regular medications including aspirin or Coumadin if these were discontinued prior to surgery. You may shower, bathe, shave, or wash your hair. Tylenol may be taken for mild discomfort.  Call your doctor if you experience significant pain, nausea, or vomiting, fever > 101 or other signs of infection. [phone] or [phone] Specific instructions:  Follow-up Information    Follow up with Tim Lair, MD.   Specialty:  Ophthalmology   Why:  05-02-16 at 9:10   Contact information:   [address] [place] [address] [phone]      Eye Surgery Discharge Instructions  Expect mild scratchy sensation or mild soreness. DO NOT RUB YOUR EYE!  The day of surgery:  Minimal physical activity, but bed rest is not required  No reading, computer work, or close hand work  No bending, lifting, or straining.  May watch TV  For 24 hours:  No driving, legal decisions, or alcoholic beverages  Safety precautions  Eat anything you prefer: It is better to start with liquids, then soup then solid foods.  _____ Eye patch should be worn until postoperative exam tomorrow.  ____ Solar shield eyeglasses should be worn for comfort in the sunlight/patch while sleeping  Resume all regular medications including aspirin or Coumadin if  these were discontinued prior to surgery. You may shower, bathe, shave, or wash your hair. Tylenol may be taken for mild discomfort.  Call your doctor if you experience significant pain, nausea, or vomiting, fever > 101 or other signs of infection. [phone] or [phone] Specific instructions:  Follow-up Information    Follow up with Tim Lair, MD.   Specialty:  Ophthalmology   Why:  05-02-16 at 9:10   Contact information:   [place] [address] [phone]

## 2016-05-01 NOTE — Anesthesia Preprocedure Evaluation (Signed)
Anesthesia Evaluation  Patient identified by MRN, date of birth, ID band Patient awake    Reviewed: Allergy & Precautions, NPO status , Patient's Chart, lab work & pertinent test results, reviewed documented beta blocker date and time   Airway Mallampati: II  TM Distance: >3 FB     Dental  (+) Chipped   Pulmonary shortness of breath, Current Smoker,           Cardiovascular hypertension, Pt. on medications + Valvular Problems/Murmurs MR      Neuro/Psych PSYCHIATRIC DISORDERS Anxiety Depression    GI/Hepatic   Endo/Other    Renal/GU      Musculoskeletal   Abdominal   Peds  Hematology   Anesthesia Other Findings   Reproductive/Obstetrics                             Anesthesia Physical Anesthesia Plan  ASA: III  Anesthesia Plan: General   Post-op Pain Management:    Induction: Intravenous and Rapid sequence  Airway Management Planned: Oral ETT  Additional Equipment:   Intra-op Plan:   Post-operative Plan:   Informed Consent: I have reviewed the patients History and Physical, chart, labs and discussed the procedure including the risks, benefits and alternatives for the proposed anesthesia with the patient or authorized representative who has indicated his/her understanding and acceptance.     Plan Discussed with: CRNA  Anesthesia Plan Comments:         Anesthesia Quick Evaluation

## 2016-05-01 NOTE — Anesthesia Postprocedure Evaluation (Signed)
Anesthesia Post Note  Patient: Erin Hinton  Procedure(s) Performed: Procedure(s) (LRB): CATARACT EXTRACTION PHACO AND INTRAOCULAR LENS PLACEMENT (IOC) (Right)  Patient location during evaluation: PACU Anesthesia Type: MAC Level of consciousness: awake, awake and alert and oriented Pain management: pain level controlled Vital Signs Assessment: post-procedure vital signs reviewed and stable Respiratory status: spontaneous breathing, nonlabored ventilation and respiratory function stable Cardiovascular status: blood pressure returned to baseline Anesthetic complications: no    Last Vitals:  Filed Vitals:   05/01/16 0939  BP: 137/71  Pulse: 51  Temp: 36.7 C  Resp: 14    Last Pain: There were no vitals filed for this visit.               Licia Harl,  [employer]

## 2016-05-29 DIAGNOSIS — H2512 Age-related nuclear cataract, left eye: Secondary | ICD-10-CM | POA: Diagnosis not present

## 2016-06-05 ENCOUNTER — Ambulatory Visit
Admission: RE | Admit: 2016-06-05 | Discharge: 2016-06-05 | Disposition: A | Discharge status: Home / Self Care | Payer: PPO | Source: Ambulatory Visit | Attending: Ophthalmology | Admitting: Ophthalmology

## 2016-06-05 ENCOUNTER — Encounter
Admission: RE | Disposition: A | Discharge status: Home / Self Care | Payer: Self-pay | Source: Ambulatory Visit | Attending: Ophthalmology

## 2016-06-05 ENCOUNTER — Ambulatory Visit: Payer: PPO | Admitting: Anesthesiology

## 2016-06-05 ENCOUNTER — Encounter: Payer: Self-pay | Admitting: *Deleted

## 2016-06-05 DIAGNOSIS — J449 Chronic obstructive pulmonary disease, unspecified: Secondary | ICD-10-CM | POA: Insufficient documentation

## 2016-06-05 DIAGNOSIS — H2512 Age-related nuclear cataract, left eye: Secondary | ICD-10-CM | POA: Diagnosis not present

## 2016-06-05 DIAGNOSIS — Z87891 Personal history of nicotine dependence: Secondary | ICD-10-CM | POA: Diagnosis not present

## 2016-06-05 DIAGNOSIS — F419 Anxiety disorder, unspecified: Secondary | ICD-10-CM | POA: Insufficient documentation

## 2016-06-05 DIAGNOSIS — I10 Essential (primary) hypertension: Secondary | ICD-10-CM | POA: Insufficient documentation

## 2016-06-05 DIAGNOSIS — F329 Major depressive disorder, single episode, unspecified: Secondary | ICD-10-CM | POA: Diagnosis not present

## 2016-06-05 HISTORY — PX: CATARACT EXTRACTION W/PHACO: SHX586

## 2016-06-05 SURGERY — PHACOEMULSIFICATION, CATARACT, WITH IOL INSERTION
Anesthesia: Monitor Anesthesia Care | Site: Eye | Laterality: Left | Wound class: Clean

## 2016-06-05 MED ORDER — MOXIFLOXACIN HCL 0.5 % OP SOLN
1.0000 [drp] | OPHTHALMIC | Status: DC | PRN
Start: 2016-06-05 — End: 2016-06-05

## 2016-06-05 MED ORDER — NA CHONDROIT SULF-NA HYALURON 40-17 MG/ML IO SOLN
INTRAOCULAR | Status: AC
  Filled 2016-06-05: qty 1

## 2016-06-05 MED ORDER — MOXIFLOXACIN HCL 0.5 % OP SOLN
OPHTHALMIC | Status: DC | PRN
  Administered 2016-06-05: 1 [drp] via OPHTHALMIC

## 2016-06-05 MED ORDER — ARMC OPHTHALMIC DILATING GEL
OPHTHALMIC | Status: AC
  Administered 2016-06-05: 1 via OPHTHALMIC
  Filled 2016-06-05: qty 0.25

## 2016-06-05 MED ORDER — POVIDONE-IODINE 5 % OP SOLN
OPHTHALMIC | Status: AC
  Administered 2016-06-05: 1 via OPHTHALMIC
  Filled 2016-06-05: qty 30

## 2016-06-05 MED ORDER — MOXIFLOXACIN HCL 0.5 % OP SOLN
OPHTHALMIC | Status: AC
  Filled 2016-06-05: qty 3

## 2016-06-05 MED ORDER — CEFUROXIME OPHTHALMIC INJECTION 1 MG/0.1 ML
INJECTION | OPHTHALMIC | Status: AC
  Filled 2016-06-05: qty 0.1

## 2016-06-05 MED ORDER — TETRACAINE HCL 0.5 % OP SOLN
1.0000 [drp] | OPHTHALMIC | Status: AC | PRN
  Administered 2016-06-05: 1 [drp] via OPHTHALMIC

## 2016-06-05 MED ORDER — FENTANYL CITRATE (PF) 100 MCG/2ML IJ SOLN: 25.0000 ug | INTRAMUSCULAR | Status: DC | PRN

## 2016-06-05 MED ORDER — EPINEPHRINE HCL 1 MG/ML IJ SOLN
INTRAMUSCULAR | Status: AC
  Filled 2016-06-05: qty 1

## 2016-06-05 MED ORDER — CARBACHOL 0.01 % IO SOLN
INTRAOCULAR | Status: DC | PRN
  Administered 2016-06-05: .5 mL via INTRAOCULAR

## 2016-06-05 MED ORDER — POVIDONE-IODINE 5 % OP SOLN
1.0000 "application " | OPHTHALMIC | Status: AC | PRN
  Administered 2016-06-05: 1 via OPHTHALMIC

## 2016-06-05 MED ORDER — ONDANSETRON HCL 4 MG/2ML IJ SOLN: 4.0000 mg | Freq: Once | INTRAMUSCULAR | Status: DC | PRN

## 2016-06-05 MED ORDER — MIDAZOLAM HCL 2 MG/2ML IJ SOLN
INTRAMUSCULAR | Status: DC | PRN
  Administered 2016-06-05 (×2): 1 mg via INTRAVENOUS

## 2016-06-05 MED ORDER — TETRACAINE HCL 0.5 % OP SOLN
OPHTHALMIC | Status: AC
  Administered 2016-06-05: 1 [drp] via OPHTHALMIC
  Filled 2016-06-05: qty 2

## 2016-06-05 MED ORDER — EPINEPHRINE HCL 1 MG/ML IJ SOLN
INTRAOCULAR | Status: DC | PRN
  Administered 2016-06-05: 1 mL via OPHTHALMIC

## 2016-06-05 MED ORDER — CEFUROXIME OPHTHALMIC INJECTION 1 MG/0.1 ML
INJECTION | OPHTHALMIC | Status: DC | PRN
  Administered 2016-06-05: .1 mL via INTRACAMERAL

## 2016-06-05 MED ORDER — SODIUM CHLORIDE 0.9 % IV SOLN
INTRAVENOUS | Status: DC
  Administered 2016-06-05: 50 mL/h via INTRAVENOUS

## 2016-06-05 MED ORDER — ARMC OPHTHALMIC DILATING GEL
1.0000 "application " | OPHTHALMIC | Status: AC | PRN
  Administered 2016-06-05 (×2): 1 via OPHTHALMIC

## 2016-06-05 MED ORDER — NA CHONDROIT SULF-NA HYALURON 40-17 MG/ML IO SOLN
INTRAOCULAR | Status: DC | PRN
  Administered 2016-06-05: 1 mL via INTRAOCULAR

## 2016-06-05 SURGICAL SUPPLY — 21 items
CANNULA ANT/CHMB 27GA (MISCELLANEOUS) ×3 IMPLANT
CUP MEDICINE 2OZ PLAST GRAD ST (MISCELLANEOUS) ×3 IMPLANT
GLOVE BIO SURGEON STRL SZ8 (GLOVE) ×3 IMPLANT
GLOVE BIOGEL M 6.5 STRL (GLOVE) ×3 IMPLANT
GLOVE SURG LX 8.0 MICRO (GLOVE) ×2
GLOVE SURG LX STRL 8.0 MICRO (GLOVE) ×1 IMPLANT
GOWN STRL REUS W/ TWL LRG LVL3 (GOWN DISPOSABLE) ×2 IMPLANT
GOWN STRL REUS W/TWL LRG LVL3 (GOWN DISPOSABLE) ×4
LENS IOL TECNIS ITEC 22.5 (Intraocular Lens) ×3 IMPLANT
PACK CATARACT (MISCELLANEOUS) ×3 IMPLANT
PACK CATARACT BRASINGTON LX (MISCELLANEOUS) ×3 IMPLANT
PACK EYE AFTER SURG (MISCELLANEOUS) ×3 IMPLANT
SOL BSS BAG (MISCELLANEOUS) ×3
SOL PREP PVP 2OZ (MISCELLANEOUS) ×3
SOLUTION BSS BAG (MISCELLANEOUS) ×1 IMPLANT
SOLUTION PREP PVP 2OZ (MISCELLANEOUS) ×1 IMPLANT
SYR 3ML LL SCALE MARK (SYRINGE) ×3 IMPLANT
SYR 5ML LL (SYRINGE) ×3 IMPLANT
SYR TB 1ML 27GX1/2 LL (SYRINGE) ×3 IMPLANT
WATER STERILE IRR 1000ML POUR (IV SOLUTION) ×3 IMPLANT
WIPE NON LINTING 3.25X3.25 (MISCELLANEOUS) ×3 IMPLANT

## 2016-06-05 NOTE — Discharge Instructions (Signed)
Eye Surgery Discharge Instructions  Expect mild scratchy sensation or mild soreness. DO NOT RUB YOUR EYE!  The day of surgery:  Minimal physical activity, but bed rest is not required  No reading, computer work, or close hand work  No bending, lifting, or straining.  May watch TV  For 24 hours:  No driving, legal decisions, or alcoholic beverages  Safety precautions  Eat anything you prefer: It is better to start with liquids, then soup then solid foods.  _____ Eye patch should be worn until postoperative exam tomorrow.  ____ Solar shield eyeglasses should be worn for comfort in the sunlight/patch while sleeping  Resume all regular medications including aspirin or Coumadin if these were discontinued prior to surgery. You may shower, bathe, shave, or wash your hair. Tylenol may be taken for mild discomfort.  Call your doctor if you experience significant pain, nausea, or vomiting, fever > 101 or other signs of infection. [phone] or [phone] Specific instructions:  Follow-up Information    Follow up with Tim Lair, MD On 06/06/2016.   Specialty:  Ophthalmology   Why:  10:10   Contact information:   [address] [place] [address] [phone]

## 2016-06-05 NOTE — Anesthesia Postprocedure Evaluation (Signed)
Anesthesia Post Note  Patient: Erin Hinton  Procedure(s) Performed: Procedure(s) (LRB): CATARACT EXTRACTION PHACO AND INTRAOCULAR LENS PLACEMENT (IOC) (Left)  Patient location during evaluation: PACU Anesthesia Type: MAC Level of consciousness: awake, awake and alert and oriented Pain management: pain level controlled Vital Signs Assessment: post-procedure vital signs reviewed and stable Respiratory status: spontaneous breathing Cardiovascular status: blood pressure returned to baseline and stable Postop Assessment: no headache and no backache    Last Vitals:  Filed Vitals:   06/05/16 0648  BP: 135/6  Pulse: 52  Temp: 35.9 C    Last Pain: There were no vitals filed for this visit.               Grier Czerwinski C

## 2016-06-05 NOTE — OR Nursing (Signed)
Cataract glasses.

## 2016-06-05 NOTE — Anesthesia Preprocedure Evaluation (Signed)
Anesthesia Evaluation  Patient identified by MRN, date of birth, ID band Patient awake    Reviewed: Allergy & Precautions, NPO status , Patient's Chart, lab work & pertinent test results  Airway Mallampati: II       Dental  (+) Teeth Intact   Pulmonary COPD, former smoker,     + decreased breath sounds      Cardiovascular Exercise Tolerance: Good hypertension, Pt. on medications  Rhythm:Regular     Neuro/Psych Anxiety Depression negative neurological ROS     GI/Hepatic negative GI ROS, Neg liver ROS,   Endo/Other  negative endocrine ROS  Renal/GU negative Renal ROS     Musculoskeletal negative musculoskeletal ROS (+)   Abdominal Normal abdominal exam  (+)   Peds  Hematology negative hematology ROS (+)   Anesthesia Other Findings   Reproductive/Obstetrics                             Anesthesia Physical Anesthesia Plan  ASA: II  Anesthesia Plan: MAC   Post-op Pain Management:    Induction: Intravenous  Airway Management Planned: Natural Airway and Nasal Cannula  Additional Equipment:   Intra-op Plan:   Post-operative Plan:   Informed Consent: I have reviewed the patients History and Physical, chart, labs and discussed the procedure including the risks, benefits and alternatives for the proposed anesthesia with the patient or authorized representative who has indicated his/her understanding and acceptance.     Plan Discussed with: CRNA  Anesthesia Plan Comments:         Anesthesia Quick Evaluation

## 2016-06-05 NOTE — Op Note (Signed)
PREOPERATIVE DIAGNOSIS:  Nuclear sclerotic cataract of the left eye.   POSTOPERATIVE DIAGNOSIS:  nuclear sclerotic cataract left eye   OPERATIVE PROCEDURE:  Procedure(s): CATARACT EXTRACTION PHACO AND INTRAOCULAR LENS PLACEMENT (IOC)   SURGEON:  Birder Robson, MD.   ANESTHESIA:   Anesthesiologist: Iver Nestle, MD CRNA: Jennette Bill  1.      Managed anesthesia care. 2.      Topical tetracaine drops followed by 2% Xylocaine jelly applied in the preoperative holding area.   COMPLICATIONS:  None.   TECHNIQUE:   Stop and chop   DESCRIPTION OF PROCEDURE:  The patient was examined and consented in the preoperative holding area where the aforementioned topical anesthesia was applied to the left eye and then brought back to the Operating Room where the left eye was prepped and draped in the usual sterile ophthalmic fashion and a lid speculum was placed. A paracentesis was created with the side port blade and the anterior chamber was filled with viscoelastic. A near clear corneal incision was performed with the steel keratome. A continuous curvilinear capsulorrhexis was performed with a cystotome followed by the capsulorrhexis forceps. Hydrodissection and hydrodelineation were carried out with BSS on a blunt cannula. The lens was removed in a stop and chop  technique and the remaining cortical material was removed with the irrigation-aspiration handpiece. The capsular bag was inflated with viscoelastic and the Technis ZCB00 lens was placed in the capsular bag without complication. The remaining viscoelastic was removed from the eye with the irrigation-aspiration handpiece. The wounds were hydrated. The anterior chamber was flushed with Miostat and the eye was inflated to physiologic pressure. 0.1 mL of cefuroxime concentration 10 mg/mL was placed in the anterior chamber. The wounds were found to be water tight. The eye was dressed with Vigamox. The patient was given protective glasses to  wear throughout the day and a shield with which to sleep tonight. The patient was also given drops with which to begin a drop regimen today and will follow-up with me in one day.  Implant Name Type Inv. Item Serial No. Manufacturer Lot No. LRB No. Used  LENS IOL DIOP 22.5 - [id number] Intraocular Lens LENS IOL DIOP 22.5 [id number]   Left 1   Procedure(s) with comments: CATARACT EXTRACTION PHACO AND INTRAOCULAR LENS PLACEMENT (IOC) (Left) - [place] 01:04 AP% 22.2 CDE 14.34 fluid pack lot # [id number]  Electronically signed: [place] 06/05/2016 8:14 AM

## 2016-06-05 NOTE — Transfer of Care (Signed)
Immediate Anesthesia Transfer of Care Note  Patient: Erin Hinton  Procedure(s) Performed: Procedure(s) with comments: CATARACT EXTRACTION PHACO AND INTRAOCULAR LENS PLACEMENT (IOC) (Left) - [place] 01:04 AP% 22.2 CDE 14.34 fluid pack lot # [id number]  Patient Location: PACU  Anesthesia Type:MAC  Level of Consciousness: awake, alert  and oriented  Airway & Oxygen Therapy: Patient Spontanous Breathing  Post-op Assessment: Report given to RN and Post -op Vital signs reviewed and stable  Post vital signs: Reviewed and stable  Last Vitals:  Filed Vitals:   06/05/16 0648  BP: 135/6  Pulse: 52  Temp: 35.9 C    Last Pain: There were no vitals filed for this visit.       Complications: No apparent anesthesia complications

## 2016-06-05 NOTE — H&P (Signed)
  All labs reviewed. Abnormal studies sent to patients PCP when indicated.  Previous H&P reviewed, patient examined, there are NO CHANGES.  Erin Hinton LOUIS6/20/20177:47 AM

## 2016-06-17 ENCOUNTER — Other Ambulatory Visit: Payer: Self-pay | Admitting: Family Medicine

## 2016-06-17 ENCOUNTER — Telehealth: Payer: Self-pay | Admitting: Family Medicine

## 2016-06-17 DIAGNOSIS — E785 Hyperlipidemia, unspecified: Secondary | ICD-10-CM

## 2016-06-17 DIAGNOSIS — I10 Essential (primary) hypertension: Secondary | ICD-10-CM

## 2016-06-17 NOTE — Telephone Encounter (Signed)
-----   Message from Ellamae Sia sent at 06/14/2016  2:25 PM EDT ----- Regarding: Lab orders for Friday, 7.7.17 Patient is scheduled for CPX labs, please order future labs, Thanks , Karna Christmas

## 2016-06-18 NOTE — Telephone Encounter (Signed)
Received refill request electronically Last office visit 05/17/15 Appointment scheduled 06/26/16 See allergy/contrainidcation Okay to refill?

## 2016-06-19 NOTE — Telephone Encounter (Signed)
Sent 30d supply

## 2016-06-21 ENCOUNTER — Other Ambulatory Visit: Payer: Self-pay

## 2016-06-22 ENCOUNTER — Other Ambulatory Visit (INDEPENDENT_AMBULATORY_CARE_PROVIDER_SITE_OTHER): Payer: PPO

## 2016-06-22 DIAGNOSIS — I10 Essential (primary) hypertension: Secondary | ICD-10-CM | POA: Diagnosis not present

## 2016-06-22 DIAGNOSIS — E785 Hyperlipidemia, unspecified: Secondary | ICD-10-CM

## 2016-06-22 LAB — COMPREHENSIVE METABOLIC PANEL
ALBUMIN: 4.3 g/dL (ref 3.5–5.2)
ALT: 12 U/L (ref 0–35)
AST: 17 U/L (ref 0–37)
Alkaline Phosphatase: 56 U/L (ref 39–117)
BUN: 9 mg/dL (ref 6–23)
CALCIUM: 9.9 mg/dL (ref 8.4–10.5)
CHLORIDE: 105 meq/L (ref 96–112)
CO2: 32 meq/L (ref 19–32)
Creatinine, Ser: 0.81 mg/dL (ref 0.40–1.20)
GFR: 74.76 mL/min (ref >60.00)
Glucose, Bld: 102 mg/dL — ABNORMAL HIGH (ref 70–99)
POTASSIUM: 4 meq/L (ref 3.5–5.1)
Sodium: 141 mEq/L (ref 135–145)
Total Bilirubin: 0.6 mg/dL (ref 0.2–1.2)
Total Protein: 6.7 g/dL (ref 6.0–8.3)

## 2016-06-22 LAB — CBC WITH DIFFERENTIAL/PLATELET
BASOS PCT: 0.5 % (ref 0.0–3.0)
Basophils Absolute: 0 10*3/uL (ref 0.0–0.1)
EOS PCT: 4.1 % (ref 0.0–5.0)
Eosinophils Absolute: 0.2 10*3/uL (ref 0.0–0.7)
HEMATOCRIT: 38.7 % (ref 36.0–46.0)
HEMOGLOBIN: 13.1 g/dL (ref 12.0–15.0)
LYMPHS PCT: 18.2 % (ref 12.0–46.0)
Lymphs Abs: 0.9 10*3/uL (ref 0.7–4.0)
MCHC: 34 g/dL (ref 30.0–36.0)
MCV: 96.5 fl (ref 78.0–100.0)
MONO ABS: 0.4 10*3/uL (ref 0.1–1.0)
Monocytes Relative: 8.5 % (ref 3.0–12.0)
NEUTROS ABS: 3.3 10*3/uL (ref 1.4–7.7)
Neutrophils Relative %: 68.7 % (ref 43.0–77.0)
PLATELETS: 227 10*3/uL (ref 150.0–400.0)
RBC: 4.01 Mil/uL (ref 3.87–5.11)
RDW: 13.3 % (ref 11.5–15.5)
WBC: 4.8 10*3/uL (ref 4.0–10.5)

## 2016-06-22 LAB — TSH: TSH: 2.18 u[IU]/mL (ref 0.35–4.50)

## 2016-06-22 LAB — LIPID PANEL
CHOLESTEROL: 222 mg/dL — AB (ref 0–200)
HDL: 60.8 mg/dL (ref >39.00)
LDL CALC: 144 mg/dL — AB (ref 0–99)
NonHDL: 160.86
TRIGLYCERIDES: 85 mg/dL (ref 0.0–149.0)
Total CHOL/HDL Ratio: 4
VLDL: 17 mg/dL (ref 0.0–40.0)

## 2016-06-26 ENCOUNTER — Ambulatory Visit (INDEPENDENT_AMBULATORY_CARE_PROVIDER_SITE_OTHER): Payer: PPO | Admitting: Family Medicine

## 2016-06-26 ENCOUNTER — Ambulatory Visit (INDEPENDENT_AMBULATORY_CARE_PROVIDER_SITE_OTHER): Payer: PPO

## 2016-06-26 ENCOUNTER — Encounter: Payer: Self-pay | Admitting: Family Medicine

## 2016-06-26 VITALS — BP 122/76 | HR 68 | Temp 98.0°F | Ht 65.0 in | Wt 172.5 lb

## 2016-06-26 DIAGNOSIS — Z1159 Encounter for screening for other viral diseases: Secondary | ICD-10-CM

## 2016-06-26 DIAGNOSIS — Z0000 Encounter for general adult medical examination without abnormal findings: Secondary | ICD-10-CM | POA: Diagnosis not present

## 2016-06-26 DIAGNOSIS — H6121 Impacted cerumen, right ear: Secondary | ICD-10-CM

## 2016-06-26 DIAGNOSIS — H6120 Impacted cerumen, unspecified ear: Secondary | ICD-10-CM | POA: Insufficient documentation

## 2016-06-26 DIAGNOSIS — Z87891 Personal history of nicotine dependence: Secondary | ICD-10-CM | POA: Diagnosis not present

## 2016-06-26 DIAGNOSIS — M899 Disorder of bone, unspecified: Secondary | ICD-10-CM | POA: Diagnosis not present

## 2016-06-26 DIAGNOSIS — I10 Essential (primary) hypertension: Secondary | ICD-10-CM | POA: Diagnosis not present

## 2016-06-26 DIAGNOSIS — E785 Hyperlipidemia, unspecified: Secondary | ICD-10-CM | POA: Diagnosis not present

## 2016-06-26 DIAGNOSIS — M949 Disorder of cartilage, unspecified: Secondary | ICD-10-CM

## 2016-06-26 MED ORDER — ESCITALOPRAM OXALATE 10 MG PO TABS: 10.0000 mg | ORAL_TABLET | Freq: Every day | ORAL | Status: DC

## 2016-06-26 MED ORDER — HYDROCHLOROTHIAZIDE 25 MG PO TABS: 25.0000 mg | ORAL_TABLET | Freq: Every day | ORAL | Status: DC

## 2016-06-26 NOTE — Progress Notes (Signed)
Subjective:    Patient ID: Erin Hinton, female    DOB: March 11, 1946, 70 y.o.   MRN: [id number]  HPI Here for annual f/u of chronic medical problems   Doing ok overall   Tough year- lost sister and a good friend  Sister had breast cancer- she helped care for her   Just had cataract surgery and between glasses   Grandson had heart surgery at 24 yo and did great !  Also retired -getting used to that  [employer] for grandson during the day- he is going into the 12 th grade    Saw Lesia for her [facility] today  No concerns except hearing-failed her hearing test  R ear- feels full - ? 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  M aunt had breast cancer  Sister with breast cancer  Mother died of ovarian ca at 63 Self breast exam She was tested for BRCA gene herself and was negative   Colonoscopy 12/16 -nl with 10 year recall , Dr Candace Cruise  bp is stable today  No cp or palpitations or headaches or edema  No side effects to medicines  BP Readings from Last 3 Encounters:  06/26/16 122/76  06/05/16 140/71  05/01/16 149/68      Hx of hyperlipidemia Lab Results  Component Value Date   CHOL 222* 06/22/2016   CHOL 182 05/11/2015   CHOL 211* 04/01/2014   Lab Results  Component Value Date   HDL 60.80 06/22/2016   HDL 53.70 05/11/2015   HDL 57.00 04/01/2014   Lab Results  Component Value Date   LDLCALC 144* 06/22/2016   LDLCALC 111* 05/11/2015   LDLCALC 137* 04/01/2014   Lab Results  Component Value Date   TRIG 85.0 06/22/2016   TRIG 85.0 05/11/2015   TRIG 84.0 04/01/2014   Lab Results  Component Value Date   CHOLHDL 4 06/22/2016   CHOLHDL 3 05/11/2015   CHOLHDL 4 04/01/2014   Lab Results  Component Value Date   LDLDIRECT  128.6 11/28/2010   LDLDIRECT 152.1 11/24/2009   LDLDIRECT 138.9 11/23/2008   LDL went up to 144 but HDL went up also Her diet was not as good - was unmotivated  Is getting back to normal   Hx of smoking in the past- uses e-cig now on occ only  On lexapro for mood Trying to get social again  Joined the gym  Walks outdoors in ams if she can  Also enjoys word games and reading the paper   Results for orders placed or performed in visit on 06/22/16  CBC with Differential/Platelet  Result Value Ref Range   WBC 4.8 4.0 - 10.5 K/uL   RBC 4.01 3.87 - 5.11 Mil/uL   Hemoglobin 13.1 12.0 - 15.0 g/dL   HCT 38.7 36.0 - 46.0 %   MCV 96.5 78.0 - 100.0 fl   MCHC 34.0 30.0 - 36.0 g/dL   RDW 13.3 11.5 - 15.5 %   Platelets 227.0 150.0 - 400.0 K/uL   Neutrophils Relative % 68.7 43.0 - 77.0 %   Lymphocytes Relative 18.2 12.0 - 46.0 %   Monocytes Relative 8.5 3.0 -  12.0 %   Eosinophils Relative 4.1 0.0 - 5.0 %   Basophils Relative 0.5 0.0 - 3.0 %   Neutro Abs 3.3 1.4 - 7.7 K/uL   Lymphs Abs 0.9 0.7 - 4.0 K/uL   Monocytes Absolute 0.4 0.1 - 1.0 K/uL   Eosinophils Absolute 0.2 0.0 - 0.7 K/uL   Basophils Absolute 0.0 0.0 - 0.1 K/uL  Comprehensive metabolic panel  Result Value Ref Range   Sodium 141 135 - 145 mEq/L   Potassium 4.0 3.5 - 5.1 mEq/L   Chloride 105 96 - 112 mEq/L   CO2 32 19 - 32 mEq/L   Glucose, Bld 102 (H) 70 - 99 mg/dL   BUN 9 6 - 23 mg/dL   Creatinine, Ser 0.81 0.40 - 1.20 mg/dL   Total Bilirubin 0.6 0.2 - 1.2 mg/dL   Alkaline Phosphatase 56 39 - 117 U/L   AST 17 0 - 37 U/L   ALT 12 0 - 35 U/L   Total Protein 6.7 6.0 - 8.3 g/dL   Albumin 4.3 3.5 - 5.2 g/dL   Calcium 9.9 8.4 - 10.5 mg/dL   GFR 74.76 >60.00 mL/min  Lipid panel  Result Value Ref Range   Cholesterol 222 (H) 0 - 200 mg/dL   Triglycerides 85.0 0.0 - 149.0 mg/dL   HDL 60.80 >39.00 mg/dL   VLDL 17.0 0.0 - 40.0 mg/dL   LDL Cholesterol 144 (H) 0 - 99 mg/dL   Total CHOL/HDL Ratio 4    NonHDL 160.86   TSH    Result Value Ref Range   TSH 2.18 0.35 - 4.50 uIU/mL     Patient Active Problem List   Diagnosis Date Noted  . Cerumen impaction 06/26/2016  . Encounter for Medicare annual wellness exam 04/07/2014  . Colon cancer screening 03/09/2013  . Routine general medical examination at a health care facility 01/30/2012  . HYPERTENSION, BENIGN ESSENTIAL 02/01/2010  . Disorder of bone and cartilage 11/23/2008  . Former smoker 10/21/2007  . Hyperlipidemia 09/29/2007  . MITRAL VALVE PROLAPSE 09/29/2007  . 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 ALLERGY 09/29/2007   Past Medical History  Diagnosis Date  . Hyperlipidemia   . Osteopenia   . Hypertension   . Hyperlipidemia   . Depression   . Anxiety   . Cancer ([facility])     skin  . Heart murmur     in her 30's and it is not a problem now  . Shortness of breath dyspnea     due to excercise only   Past Surgical History  Procedure Laterality Date  . Colonoscopy with propofol N/A 11/21/2015    Procedure: COLONOSCOPY WITH PROPOFOL;  Surgeon: Hulen Luster, MD;  Location: [facility] ENDOSCOPY;  Service: Gastroenterology;  Laterality: N/A;  . Cataract extraction w/phaco Right 05/01/2016    Procedure: CATARACT EXTRACTION PHACO AND INTRAOCULAR LENS PLACEMENT (IOC);  Surgeon: Birder Robson, MD;  Location: [facility] ORS;  Service: Ophthalmology;  Laterality: Right;  [place] 00:53 AP% 21.7 CDE 11.50 fluid pack lot # [id number]  . 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 Breast biopsy Right 01/2002    neg. no lymph node involvement  . Cataract extraction w/phaco Left 06/05/2016    Procedure: CATARACT EXTRACTION PHACO AND INTRAOCULAR LENS PLACEMENT (IOC);  Surgeon: Birder Robson, MD;  Location: [facility] ORS;  Service: Ophthalmology;  Laterality: Left;  [place] 01:04 AP% 22.2 CDE 14.34 fluid pack lot # [id number]   Social History  Substance Use Topics  .  Smoking status: Former Smoker -- 0.50 packs/day for 45 years    Types: Cigarettes    Quit date: 01/22/2016  .  Smokeless tobacco: None     Comment: also uses e-cigaretts on occasion  . Alcohol Use: No   Family History  Problem Relation Age of Onset  . Lymphoma Father   . Heart disease Father     MI  . Hypertension Father   . Alcohol abuse Brother   . Cancer Maternal Aunt     breast cancer   Allergies  Allergen Reactions  . Alendronate Sodium     REACTION: GI  . Atorvastatin     REACTION: Psin  . Evista [Raloxifene] Other (See Comments)    Breast tenderness  . Sertraline Hcl     Did not help, made anxiety worse  . Pseudoephedrine     REACTION: palpitations   Current Outpatient Prescriptions on File Prior to Visit  Medication Sig Dispense Refill  . Calcium Carb-Cholecalciferol (CALCIUM 600 + D PO) Take 1 capsule by mouth daily.    . Coconut Oil 1000 MG CAPS Take 1,000 mg by mouth daily.    . Difluprednate (DUREZOL OP) Place 1 drop into the left eye 2 (two) times daily.    . Multiple Vitamin (MULTIVITAMIN WITH MINERALS) TABS tablet Take 1 tablet by mouth daily.    . Omega-3 Fatty Acids (FISH OIL PO) Take 1 tablet by mouth 2 (two) times daily. Rotates with Flaxseed oil tabs    . vitamin C (ASCORBIC ACID) 500 MG tablet Take 500 mg by mouth daily.    . Vitamin D, Cholecalciferol, 1000 UNITS CAPS Take 1 capsule by mouth daily.    . vitamin E 1000 UNIT capsule Take 1,000 Units by mouth daily.     No current facility-administered medications on file prior to visit.    Review of Systems Review of Systems  Constitutional: Negative for fever, appetite change, fatigue and unexpected weight change.  Eyes: Negative for pain and visual disturbance.  Respiratory: Negative for cough and shortness of breath.   Cardiovascular: Negative for cp or palpitations    Gastrointestinal: Negative for nausea, diarrhea and constipation.  Genitourinary: Negative for urgency and frequency.  Skin: Negative for pallor or rash   Neurological: Negative for weakness, light-headedness, numbness and headaches.    Hematological: Negative for adenopathy. Does not bruise/bleed easily.  Psychiatric/Behavioral: Negative for dysphoric mood. The patient is not nervous/anxious.  pos for grief reaction she thinks she is handling well        Objective:   Physical Exam  Constitutional: She appears well-developed and well-nourished. No distress.  overwt and well appearing   HENT:  Head: Normocephalic and atraumatic.  Right Ear: External ear normal.  Left Ear: External ear normal.  Mouth/Throat: Oropharynx is clear and moist.  Eyes: Conjunctivae and EOM are normal. Pupils are equal, round, and reactive to light. No scleral icterus.  Neck: Normal range of motion. Neck supple. No JVD present. Carotid bruit is not present. No thyromegaly present.  Cardiovascular: Normal rate, regular rhythm, normal heart sounds and intact distal pulses.  Exam reveals no gallop.   Pulmonary/Chest: Effort normal and breath sounds normal. No respiratory distress. She has no wheezes. She exhibits no tenderness.  Abdominal: Soft. Bowel sounds are normal. She exhibits no distension, no abdominal bruit and no mass. There is no tenderness.  Genitourinary: No breast swelling, tenderness, discharge or bleeding.  Breast exam: No mass, nodules, thickening, tenderness, bulging, retraction, inflamation, nipple discharge or skin  changes noted.  No axillary or clavicular LA.     Baseline scars noted  Musculoskeletal: Normal range of motion. She exhibits no edema or tenderness.  Lymphadenopathy:    She has no cervical adenopathy.  Neurological: She is alert. She has normal reflexes. No cranial nerve deficit. She exhibits normal muscle tone. Coordination normal.  Skin: Skin is warm and dry. No rash noted. No erythema. No pallor.  Psychiatric: She has a normal mood and affect.          Assessment & Plan:   Problem List Items Addressed This Visit      Cardiovascular and Mediastinum   HYPERTENSION, BENIGN ESSENTIAL - Primary    bp in  fair control at this time  BP Readings from Last 1 Encounters:  06/26/16 122/76   No changes needed Disc lifstyle change with low sodium diet and exercise  Labs reviewed       Relevant Medications   hydrochlorothiazide (HYDRODIURIL) 25 MG tablet     Nervous and Auditory   Cerumen impaction    In R ear affecting hearing  This is deep in canal and dry appearing inst pt to use debrox otc as directed twice weekly and f/u in 2-3 wk for simple ear irrigation         Musculoskeletal and Integument   Disorder of bone and cartilage    Last dexa 12/11 She is not yet ready to schedule next one but will call when she is (schedule wise) No falls or fx On ca and D Working on exercise Former smoker-aware of risks         Other   Hyperlipidemia    LDL is up-per pt recent poor eating to blame (with stressors/loss and less motivation) Thinks she can get back on track Will continue to monitor Disc goals for lipids and reasons to control them Rev labs with pt Rev low sat fat diet in detail       Relevant Medications   hydrochlorothiazide (HYDRODIURIL) 25 MG tablet   Former smoker    Pt has quit with occ e -cig She is doing well Commended Breathing has already improved per pt Enc her to continue being smoke free

## 2016-06-26 NOTE — Progress Notes (Signed)
Pre visit review using our clinic review tool, if applicable. No additional management support is needed unless otherwise documented below in the visit note. 

## 2016-06-26 NOTE — Assessment & Plan Note (Signed)
LDL is up-per pt recent poor eating to blame (with stressors/loss and less motivation) Thinks she can get back on track Will continue to monitor Disc goals for lipids and reasons to control them Rev labs with pt Rev low sat fat diet in detail

## 2016-06-26 NOTE — Assessment & Plan Note (Signed)
In R ear affecting hearing  This is deep in canal and dry appearing inst pt to use debrox otc as directed twice weekly and f/u in 2-3 wk for simple ear irrigation

## 2016-06-26 NOTE — Progress Notes (Signed)
PCP notes:  Health maintenance:  Hep C screening - completed  Abnormal screenings:   Hearing-failed  Patient concerns: Pt is having difficulty hearing in right ear.  Nurse concerns: None  Next PCP appt: 06/26/2016 @ 1430  I reviewed health advisor's note, was available for consultation, and agree with documentation and plan. Loura Pardon MD

## 2016-06-26 NOTE — Patient Instructions (Addendum)
When you are ready to schedule your bone density test- give [place] a call  For cholesterol  (Avoid red meat/ fried foods/ egg yolks/ fatty breakfast meats/ butter, cheese and high fat dairy/ and shellfish)  You have a wax (cerumen) impaction in the R ear  Get Debrox over the counter and use it twice weekly just on the right ear as directed Follow up with [place] in 2-3 weeks and we will irritate your ear

## 2016-06-26 NOTE — Patient Instructions (Addendum)
Erin Hinton , Thank you for taking time to come for your Medicare Wellness Visit. I appreciate your ongoing commitment to your health goals. Please review the following plan we discussed and let me know if I can assist you in the future.   These are the goals we discussed: Goals    . Increase physical activity     Starting 06/26/2016, I will continue to exercise for at least 35 min 4 days per week.        This is a list of the screening recommended for you and due dates:  Health Maintenance  Topic Date Due  . Stool Blood Test  06/26/2029*  . Flu Shot  07/17/2016  . Mammogram  12/07/2016  . Tetanus Vaccine  09/15/2019  . Colon Cancer Screening  11/20/2025  . DEXA scan (bone density measurement)  Completed  . Shingles Vaccine  Completed  .  Hepatitis C: One time screening is recommended by Center for Disease Control  (CDC) for  adults born from [birth year] through 1963.   Completed  . Pneumonia vaccines  Completed  *Topic was postponed. The date shown is not the original due date.    Preventive Care for Adults  A healthy lifestyle and preventive care can promote health and wellness. Preventive health guidelines for adults include the following key practices.  . A routine yearly physical is a good way to check with your health care provider about your health and preventive screening. It is a chance to share any concerns and updates on your health and to receive a thorough exam.  . Visit your dentist for a routine exam and preventive care every 6 months. Brush your teeth twice a day and floss once a day. Good oral hygiene prevents tooth decay and gum disease.  . The frequency of eye exams is based on your age, health, family medical history, use  of contact lenses, and other factors. Follow your health care provider's ecommendations for frequency of eye exams.  . Eat a healthy diet. Foods like vegetables, fruits, whole grains, low-fat dairy products, and lean protein foods contain the  nutrients you need without too many calories. Decrease your intake of foods high in solid fats, added sugars, and salt. Eat the right amount of calories for you. Get information about a proper diet from your health care provider, if necessary.  . Regular physical exercise is one of the most important things you can do for your health. Most adults should get at least 150 minutes of moderate-intensity exercise (any activity that increases your heart rate and causes you to sweat) each week. In addition, most adults need muscle-strengthening exercises on 2 or more days a week.  Silver Sneakers may be a benefit available to you. To determine eligibility, you may visit the website: www.silversneakers.com or contact program at (210)840-8685 Mon-Fri between 8AM-8PM.   . Maintain a healthy weight. The body mass index (BMI) is a screening tool to identify possible weight problems. It provides an estimate of body fat based on height and weight. Your health care provider can find your BMI and can help you achieve or maintain a healthy weight.   For adults 20 years and older: ? A BMI below 18.5 is considered underweight. ? A BMI of 18.5 to 24.9 is normal. ? A BMI of 25 to 29.9 is considered overweight. ? A BMI of 30 and above is considered obese.   . Maintain normal blood lipids and cholesterol levels by exercising and minimizing your intake of  saturated fat. Eat a balanced diet with plenty of fruit and vegetables. Blood tests for lipids and cholesterol should begin at age 34 and be repeated every 5 years. If your lipid or cholesterol levels are high, you are over 50, or you are at high risk for heart disease, you may need your cholesterol levels checked more frequently. Ongoing high lipid and cholesterol levels should be treated with medicines if diet and exercise are not working.  . If you smoke, find out from your health care provider how to quit. If you do not use tobacco, please do not start.  . If you  choose to drink alcohol, please do not consume more than 2 drinks per day. One drink is considered to be 12 ounces (355 mL) of beer, 5 ounces (148 mL) of wine, or 1.5 ounces (44 mL) of liquor.  . If you are 88-57 years old, ask your health care provider if you should take aspirin to prevent strokes.  . Use sunscreen. Apply sunscreen liberally and repeatedly throughout the day. You should seek shade when your shadow is shorter than you. Protect yourself by wearing long sleeves, pants, a wide-brimmed hat, and sunglasses year round, whenever you are outdoors.  . Once a month, do a whole body skin exam, using a mirror to look at the skin on your back. Tell your health care provider of new moles, moles that have irregular borders, moles that are larger than a pencil eraser, or moles that have changed in shape or color.

## 2016-06-26 NOTE — Assessment & Plan Note (Signed)
Pt has quit with occ e -cig She is doing well Commended Breathing has already improved per pt Enc her to continue being smoke free

## 2016-06-26 NOTE — Assessment & Plan Note (Signed)
bp in fair control at this time  BP Readings from Last 1 Encounters:  06/26/16 122/76   No changes needed Disc lifstyle change with low sodium diet and exercise  Labs reviewed

## 2016-06-26 NOTE — Progress Notes (Signed)
Subjective:   Erin Hinton is a 70 y.o. female who presents for Medicare Annual (Subsequent) preventive examination.  Review of Systems:  N/A Cardiac Risk Factors include: advanced age (>1men, >37 women);dyslipidemia;hypertension     Objective:     Vitals: BP 122/76 mmHg  Pulse 68  Temp(Src) 98 F (36.7 C) (Oral)  Ht 5\' 5"  (1.651 m)  Wt 172 lb 8 oz (78.245 kg)  BMI 28.71 kg/m2  SpO2 95%  Body mass index is 28.71 kg/(m^2).   Tobacco History  Smoking status  . Former Smoker -- 0.50 packs/day for 45 years  . Types: Cigarettes  . Quit date: 01/22/2016  Smokeless tobacco  . Not on file    Comment: also uses e-cigaretts on occasion     Counseling given: No   Past Medical History  Diagnosis Date  . Hyperlipidemia   . Osteopenia   . Hypertension   . Hyperlipidemia   . Depression   . Anxiety   . Cancer ([place])     skin  . Heart murmur     in her 30's and it is not a problem now  . Shortness of breath dyspnea     due to excercise only   Past Surgical History  Procedure Laterality Date  . Colonoscopy with propofol N/A 11/21/2015    Procedure: COLONOSCOPY WITH PROPOFOL;  Surgeon: Hulen Luster, MD;  Location[facility] ENDOSCOPY;  Service: Gastroenterology;  Laterality: N/A;  . Cataract extraction w/phaco Right 05/01/2016    Procedure: CATARACT EXTRACTION PHACO AND INTRAOCULAR LENS PLACEMENT (IOC);  Surgeon: Birder Robson, MD;  Location: [facility] ORS;  Service: Ophthalmology;  Laterality: Right;  [place] 00:53 AP% 21.7 CDE 11.50 fluid pack lot # [id number]  . 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 Breast biopsy Right 01/2002    neg. no lymph node involvement  . Cataract extraction w/phaco Left 06/05/2016    Procedure: CATARACT EXTRACTION PHACO AND INTRAOCULAR LENS PLACEMENT (IOC);  Surgeon: Birder Robson, MD;  Location: [facility] ORS;  Service: Ophthalmology;  Laterality: Left;  [place] 01:04 AP% 22.2 CDE 14.34 fluid pack lot # [id number]   Family History  Problem Relation  Age of Onset  . Lymphoma Father   . Heart disease Father     MI  . Hypertension Father   . Alcohol abuse Brother   . Cancer Maternal Aunt     breast cancer   History  Sexual Activity  . Sexual Activity: No    Outpatient Encounter Prescriptions as of 06/26/2016  Medication Sig  . Calcium Carb-Cholecalciferol (CALCIUM 600 + D PO) Take 1 capsule by mouth daily.  . Coconut Oil 1000 MG CAPS Take 1,000 mg by mouth daily.  . Difluprednate (DUREZOL OP) Place 1 drop into the left eye 2 (two) times daily.  Marland Kitchen escitalopram (LEXAPRO) 10 MG tablet TAKE 1 TABLET BY MOUTH EVERY DAY  . hydrochlorothiazide (HYDRODIURIL) 25 MG tablet TAKE 1 TABLET BY MOUTH DAILY.  . Multiple Vitamin (MULTIVITAMIN WITH MINERALS) TABS tablet Take 1 tablet by mouth daily.  . Omega-3 Fatty Acids (FISH OIL PO) Take 1 tablet by mouth 2 (two) times daily. Rotates with Flaxseed oil tabs  . vitamin C (ASCORBIC ACID) 500 MG tablet Take 500 mg by mouth daily.  . Vitamin D, Cholecalciferol, 1000 UNITS CAPS Take 1 capsule by mouth daily.  . vitamin E 1000 UNIT capsule Take 1,000 Units by mouth daily.   No facility-administered encounter medications on file as of 06/26/2016.  Activities of Daily Living In your present state of health, do you have any difficulty performing the following activities: 06/26/2016  Hearing? Y  Vision? N  Difficulty concentrating or making decisions? N  Walking or climbing stairs? N  Dressing or bathing? N  Doing errands, shopping? N  Preparing Food and eating ? N  Using the Toilet? N  In the past six months, have you accidently leaked urine? N  Do you have problems with loss of bowel control? N  Managing your Medications? N  Managing your Finances? N  Housekeeping or managing your Housekeeping? N    Patient Care Team: Abner Greenspan, MD as PCP - General Birder Robson, MD as Referring Physician (Ophthalmology)    Assessment:     Hearing Screening   125Hz  250Hz  500Hz  1000Hz  2000Hz   4000Hz  8000Hz   Right ear:   40 0 40 0   Left ear:   0 0 0 0   Vision Screening Comments: Last vision exam with Dr. Merla Riches in May 2017   Exercise Activities and Dietary recommendations Current Exercise Habits: Home exercise routine, Type of exercise: walking, Time (Minutes): 35, Frequency (Times/Week): 4, Weekly Exercise (Minutes/Week): 140, Intensity: Moderate, Exercise limited by: None identified  Goals    . Increase physical activity     Starting 06/26/2016, I will continue to exercise for at least 35 min 4 days per week.       Fall Risk Fall Risk  06/26/2016 06/26/2016 05/17/2015 04/07/2014  Falls in the past year? No No No No   Depression Screen PHQ 2/9 Scores 06/26/2016 06/26/2016 05/17/2015 04/07/2014  PHQ - 2 Score 0 0 0 0     Cognitive Testing MMSE - Mini Mental State Exam 06/26/2016  Orientation to time 5  Orientation to Place 5  Registration 3  Attention/ Calculation 0  Recall 3  Language- name 2 objects 0  Language- repeat 1  Language- follow 3 step command 3  Language- read & follow direction 0  Write a sentence 0  Copy design 0  Total score 20   PLEASE NOTE: A Mini-Cog screen was completed. Maximum score is 20. A value of 0 denotes this part of Folstein MMSE was not completed or the patient failed this part of the Mini-Cog screening.   Mini-Cog Screening Orientation to Time - Max 5 pts Orientation to Place - Max 5 pts Registration - Max 3 pts Recall - Max 3 pts Language Repeat - Max 1 pts Language Follow 3 Step Command - Max 3 pts  Immunization History  Administered Date(s) Administered  . Influenza Whole 09/16/2008, 11/10/2009, 09/26/2010  . Influenza, High Dose Seasonal PF 09/11/2014, 09/21/2015  . Influenza-Unspecified 09/16/2013  . Pneumococcal Conjugate-13 05/17/2015  . Pneumococcal Polysaccharide-23 03/24/2004, 11/24/2009, 04/07/2014  . Td 09/09/2006, 09/14/2009  . Zoster 12/06/2010   Screening Tests Health Maintenance  Topic Date Due  . COLON  CANCER SCREENING ANNUAL FOBT  06/26/2029 (Originally 05/05/2015)  . INFLUENZA VACCINE  07/17/2016  . MAMMOGRAM  12/07/2016  . TETANUS/TDAP  09/15/2019  . COLONOSCOPY  11/20/2025  . DEXA SCAN  Completed  . ZOSTAVAX  Completed  . Hepatitis C Screening  Completed  . PNA vac Low Risk Adult  Completed      Plan:     I have personally reviewed and addressed the Medicare Annual Wellness questionnaire and have noted the following in the patient's chart:  A. Medical and social history B. Use of alcohol, tobacco or illicit drugs  C. Current medications and supplements  D. Functional ability and status E.  Nutritional status F.  Physical activity G. Advance directives H. List of other physicians I.  Hospitalizations, surgeries, and ER visits in previous 12 months J.  [place] to include hearing, vision, cognitive, depression L. Referrals and appointments - none  In addition, I have reviewed and discussed with patient certain preventive protocols, quality metrics, and best practice recommendations. A written personalized care plan for preventive services as well as general preventive health recommendations were provided to patient.  See attached scanned questionnaire for additional information.   Signed,   Lindell Noe, MHA, BS, LPN Health Advisor

## 2016-06-26 NOTE — Assessment & Plan Note (Signed)
Last dexa 12/11 She is not yet ready to schedule next one but will call when she is (schedule wise) No falls or fx On ca and D Working on exercise Former smoker-aware of risks

## 2016-06-27 LAB — HEPATITIS C ANTIBODY: HCV AB: NEGATIVE

## 2016-07-02 DIAGNOSIS — D0462 Carcinoma in situ of skin of left upper limb, including shoulder: Secondary | ICD-10-CM | POA: Diagnosis not present

## 2016-07-02 DIAGNOSIS — L570 Actinic keratosis: Secondary | ICD-10-CM | POA: Diagnosis not present

## 2016-07-02 DIAGNOSIS — L820 Inflamed seborrheic keratosis: Secondary | ICD-10-CM | POA: Diagnosis not present

## 2016-07-02 DIAGNOSIS — D485 Neoplasm of uncertain behavior of skin: Secondary | ICD-10-CM | POA: Diagnosis not present

## 2016-07-02 DIAGNOSIS — X32XXXA Exposure to sunlight, initial encounter: Secondary | ICD-10-CM | POA: Diagnosis not present

## 2016-07-04 DIAGNOSIS — Z961 Presence of intraocular lens: Secondary | ICD-10-CM | POA: Diagnosis not present

## 2016-07-10 ENCOUNTER — Encounter: Payer: Self-pay | Admitting: Family Medicine

## 2016-07-10 ENCOUNTER — Ambulatory Visit (INDEPENDENT_AMBULATORY_CARE_PROVIDER_SITE_OTHER): Payer: PPO | Admitting: Family Medicine

## 2016-07-10 VITALS — BP 128/78 | HR 60 | Temp 98.0°F | Ht 65.0 in | Wt 171.8 lb

## 2016-07-10 DIAGNOSIS — H6121 Impacted cerumen, right ear: Secondary | ICD-10-CM | POA: Diagnosis not present

## 2016-07-10 NOTE — Assessment & Plan Note (Signed)
Resolved with use of debrox at home and then simple ear irrigation  Hearing improved  No c/o Nl exam  Disc aftercare

## 2016-07-10 NOTE — Patient Instructions (Signed)
Ear is clear Hearing should continue to improve  If pain or other symptoms let me know  You can use debrox as needed at home

## 2016-07-10 NOTE — Progress Notes (Signed)
Pre visit review using our clinic review tool, if applicable. No additional management support is needed unless otherwise documented below in the visit note. 

## 2016-07-10 NOTE — Progress Notes (Signed)
Subjective:    Patient ID: Erin Hinton, female    DOB: [birth year], 70 y.o.   MRN: [id number]  HPI  Here for cerumen impaction in R ear  Using debrox   Nothing has come out but she can hear it popping No pain   Patient Active Problem List   Diagnosis Date Noted  . Cerumen impaction 06/26/2016  . Encounter for Medicare annual wellness exam 04/07/2014  . Colon cancer screening 03/09/2013  . Routine general medical examination at a health care facility 01/30/2012  . HYPERTENSION, BENIGN ESSENTIAL 02/01/2010  . Disorder of bone and cartilage 11/23/2008  . Former smoker 10/21/2007  . Hyperlipidemia 09/29/2007  . MITRAL VALVE PROLAPSE 09/29/2007  . 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 ALLERGY 09/29/2007   Past Medical History:  Diagnosis Date  . Anxiety   . Cancer ([place])    skin  . Depression   . Heart murmur    in her 30's and it is not a problem now  . Hyperlipidemia   . Hyperlipidemia   . Hypertension   . Osteopenia   . Shortness of breath dyspnea    due to excercise only   Past Surgical History:  Procedure Laterality Date  . BREAST BIOPSY Right 01/2002   neg. no lymph node involvement  . 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 CATARACT EXTRACTION W/PHACO Right 05/01/2016   Procedure: CATARACT EXTRACTION PHACO AND INTRAOCULAR LENS PLACEMENT (IOC);  Surgeon: Birder Robson, MD;  Location: [facility] ORS;  Service: Ophthalmology;  Laterality: Right;  [place] 00:53 AP% 21.7 CDE 11.50 fluid pack lot # [id number]  . CATARACT EXTRACTION W/PHACO Left 06/05/2016   Procedure: CATARACT EXTRACTION PHACO AND INTRAOCULAR LENS PLACEMENT ([place]);  Surgeon: Birder Robson, MD;  Location: [facility] ORS;  Service: Ophthalmology;  Laterality: Left;  [place] 01:04 AP% 22.2 CDE 14.34 fluid pack lot # [id number]  . COLONOSCOPY WITH PROPOFOL N/A 11/21/2015   Procedure: COLONOSCOPY WITH PROPOFOL;  Surgeon: Hulen Luster, MD;  Location: [facility] ENDOSCOPY;  Service: Gastroenterology;  Laterality: N/A;    Social History  Substance Use Topics  . Smoking status: Former Smoker    Packs/day: 0.50    Years: 45.00    Types: Cigarettes    Quit date: 01/22/2016  . Smokeless tobacco: Never Used     Comment: also uses e-cigaretts on occasion  . Alcohol use No   Family History  Problem Relation Age of Onset  . Lymphoma Father   . Heart disease Father     MI  . Hypertension Father   . Alcohol abuse Brother   . Cancer Maternal Aunt     breast cancer  . Breast cancer Sister    Allergies  Allergen Reactions  . Alendronate Sodium     REACTION: GI  . Atorvastatin     REACTION: Psin  . Evista [Raloxifene] Other (See Comments)    Breast tenderness  . Sertraline Hcl     Did not help, made anxiety worse  . Pseudoephedrine     REACTION: palpitations   Current Outpatient Prescriptions on File Prior to Visit  Medication Sig Dispense Refill  . Calcium Carb-Cholecalciferol (CALCIUM 600 + D PO) Take 1 capsule by mouth daily.    . Coconut Oil 1000 MG CAPS Take 1,000 mg by mouth daily.    Marland Kitchen escitalopram (LEXAPRO) 10 MG tablet Take 1 tablet (10 mg total) by mouth daily. 90 tablet 3  . hydrochlorothiazide (HYDRODIURIL) 25 MG tablet Take 1 tablet (25  mg total) by mouth daily. 90 tablet 3  . Multiple Vitamin (MULTIVITAMIN WITH MINERALS) TABS tablet Take 1 tablet by mouth daily.    . Omega-3 Fatty Acids (FISH OIL PO) Take 1 tablet by mouth 2 (two) times daily. Rotates with Flaxseed oil tabs    . vitamin C (ASCORBIC ACID) 500 MG tablet Take 500 mg by mouth daily.    . Vitamin D, Cholecalciferol, 1000 UNITS CAPS Take 1 capsule by mouth daily.    . vitamin E 1000 UNIT capsule Take 1,000 Units by mouth daily.     No current facility-administered medications on file prior to visit.      Review of Systems Review of Systems  Constitutional: Negative for fever, appetite change, fatigue and unexpected weight change.  ENT pos for dec hearing in R ear  Eyes: Negative for pain and visual disturbance.   Respiratory: Negative for cough and shortness of breath.   Cardiovascular: Negative for cp or palpitations    Gastrointestinal: Negative for nausea, diarrhea and constipation.  Genitourinary: Negative for urgency and frequency.  Skin: Negative for pallor or rash   Neurological: Negative for weakness, light-headedness, numbness and headaches.  Hematological: Negative for adenopathy. Does not bruise/bleed easily.  Psychiatric/Behavioral: Negative for dysphoric mood. The patient is not nervous/anxious.         Objective:   Physical Exam  Constitutional: She appears well-developed and well-nourished. No distress.  HENT:  Head: Normocephalic and atraumatic.  Left Ear: External ear normal.  Mouth/Throat: Oropharynx is clear and moist.  R ear canal is impacted with cerumen  S/p simple ear irrigation- completely clear- visible TM with slight old scarring  Ear canal is not red or swollen Hearing is improved as well  Pt tolerated procedure well   Eyes: Conjunctivae and EOM are normal. Pupils are equal, round, and reactive to light. Right eye exhibits no discharge. Left eye exhibits no discharge.  Neck: Normal range of motion. Neck supple.  Lymphadenopathy:    She has no cervical adenopathy.  Skin: Skin is warm and dry. No rash noted. No erythema. No pallor.  Psychiatric: She has a normal mood and affect.          Assessment & Plan:   Problem List Items Addressed This Visit      Nervous and Auditory   Cerumen impaction - Primary    Resolved with use of debrox at home and then simple ear irrigation  Hearing improved  No c/o Nl exam  Disc aftercare        Other Visit Diagnoses   None.

## 2016-08-13 DIAGNOSIS — D0462 Carcinoma in situ of skin of left upper limb, including shoulder: Secondary | ICD-10-CM | POA: Diagnosis not present

## 2016-11-05 ENCOUNTER — Other Ambulatory Visit: Payer: Self-pay | Admitting: Family Medicine

## 2016-11-05 DIAGNOSIS — Z1231 Encounter for screening mammogram for malignant neoplasm of breast: Secondary | ICD-10-CM

## 2016-12-07 DIAGNOSIS — X32XXXA Exposure to sunlight, initial encounter: Secondary | ICD-10-CM | POA: Diagnosis not present

## 2016-12-07 DIAGNOSIS — L570 Actinic keratosis: Secondary | ICD-10-CM | POA: Diagnosis not present

## 2016-12-07 DIAGNOSIS — L821 Other seborrheic keratosis: Secondary | ICD-10-CM | POA: Diagnosis not present

## 2016-12-07 DIAGNOSIS — Z85828 Personal history of other malignant neoplasm of skin: Secondary | ICD-10-CM | POA: Diagnosis not present

## 2016-12-07 DIAGNOSIS — Z08 Encounter for follow-up examination after completed treatment for malignant neoplasm: Secondary | ICD-10-CM | POA: Diagnosis not present

## 2016-12-19 ENCOUNTER — Ambulatory Visit
Admission: RE | Admit: 2016-12-19 | Discharge: 2016-12-19 | Disposition: A | Discharge status: Home / Self Care | Payer: PPO | Source: Ambulatory Visit | Attending: Family Medicine | Admitting: Family Medicine

## 2016-12-19 DIAGNOSIS — Z1231 Encounter for screening mammogram for malignant neoplasm of breast: Secondary | ICD-10-CM

## 2016-12-31 DIAGNOSIS — M3501 Sicca syndrome with keratoconjunctivitis: Secondary | ICD-10-CM | POA: Diagnosis not present

## 2017-06-10 DIAGNOSIS — Z85828 Personal history of other malignant neoplasm of skin: Secondary | ICD-10-CM | POA: Diagnosis not present

## 2017-06-10 DIAGNOSIS — L570 Actinic keratosis: Secondary | ICD-10-CM | POA: Diagnosis not present

## 2017-06-10 DIAGNOSIS — X32XXXA Exposure to sunlight, initial encounter: Secondary | ICD-10-CM | POA: Diagnosis not present

## 2017-06-10 DIAGNOSIS — Z08 Encounter for follow-up examination after completed treatment for malignant neoplasm: Secondary | ICD-10-CM | POA: Diagnosis not present

## 2017-06-10 DIAGNOSIS — L821 Other seborrheic keratosis: Secondary | ICD-10-CM | POA: Diagnosis not present

## 2017-06-16 ENCOUNTER — Other Ambulatory Visit: Payer: Self-pay | Admitting: Family Medicine

## 2017-07-02 NOTE — Progress Notes (Signed)
PCP notes:   Health maintenance: No gaps identified.   Abnormal screenings: None   Patient concerns:   1) Fatigue/low energy  2) Sister died from MI, suspected side effect of chemo. Pt wonders if she should have screening EKG or other testing.  Nurse concerns: BP elevated. She reports she is very stressed right now.   Next PCP appt: 07/15/2017 @ 9:30am  I reviewed health advisor's note, was available for consultation, and agree with documentation and plan. Loura Pardon MD

## 2017-07-02 NOTE — Progress Notes (Signed)
Subjective:   Erin Hinton is a 71 y.o. female who presents for Medicare Annual (Subsequent) preventive examination.  Review of Systems:  No ROS.  Medicare Wellness Visit. Additional risk factors are reflected in the social history.  Cardiac Risk Factors include: advanced age (>32men, >22 women);dyslipidemia;hypertension     Objective:     Vitals: BP (!) 158/92 (BP Location: Right Arm, Patient Position: Sitting, Cuff Size: Normal)   Pulse 60   Resp 16   Ht 5\' 5"  (1.651 m)   Wt 178 lb 12 oz (81.1 kg)   SpO2 97%   BMI 29.75 kg/m   Body mass index is 29.75 kg/m.   Tobacco History  Smoking Status  . Former Smoker  . Packs/day: 0.50  . Years: 45.00  . Types: Cigarettes  . Quit date: 01/22/2016  Smokeless Tobacco  . Never Used    Comment: also uses e-cigaretts on occasion     Counseling given: Not Answered   Past Medical History:  Diagnosis Date  . Anxiety   . Cancer ([place])    skin  . Depression   . Heart murmur    in her 30's and it is not a problem now  . Hyperlipidemia   . Hyperlipidemia   . Hypertension   . Osteopenia   . Shortness of breath dyspnea    due to excercise only   Past Surgical History:  Procedure Laterality Date  . BREAST BIOPSY Right 01/2002   neg. no lymph node involvement  . 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 CATARACT EXTRACTION W/PHACO Right 05/01/2016   Procedure: CATARACT EXTRACTION PHACO AND INTRAOCULAR LENS PLACEMENT (IOC);  Surgeon: Birder Robson, MD;  Location: [facility] ORS;  Service: Ophthalmology;  Laterality: Right;  [place] 00:53 AP% 21.7 CDE 11.50 fluid pack lot # [id number]  . CATARACT EXTRACTION W/PHACO Left 06/05/2016   Procedure: CATARACT EXTRACTION PHACO AND INTRAOCULAR LENS PLACEMENT ([place]);  Surgeon: Birder Robson, MD;  Location: [facility] ORS;  Service: Ophthalmology;  Laterality: Left;  [place] 01:04 AP% 22.2 CDE 14.34 fluid pack lot # [id number]  . COLONOSCOPY WITH PROPOFOL N/A 11/21/2015   Procedure: COLONOSCOPY  WITH PROPOFOL;  Surgeon: Hulen Luster, MD;  Location: [facility] ENDOSCOPY;  Service: Gastroenterology;  Laterality: N/A;   Family History  Problem Relation Age of Onset  . Lymphoma Father   . Heart disease Father        MI  . Hypertension Father   . Alcohol abuse Brother   . Cancer Maternal Aunt        breast cancer  . Breast cancer Sister   . Breast cancer Daughter    History  Sexual Activity  . Sexual activity: No    Outpatient Encounter Prescriptions as of 07/10/2017  Medication Sig  . aspirin EC 81 MG tablet Take 1 tablet by mouth daily as needed.  . Calcium Carb-Cholecalciferol (CALCIUM 600 + D PO) Take 1 capsule by mouth daily.  . Coconut Oil 1000 MG CAPS Take 1,000 mg by mouth daily.  Marland Kitchen escitalopram (LEXAPRO) 10 MG tablet TAKE 1 TABLET (10 MG TOTAL) BY MOUTH DAILY.  . fluticasone (FLONASE) 50 MCG/ACT nasal spray 1 spray daily as needed.  . hydrochlorothiazide (HYDRODIURIL) 25 MG tablet Take 1 tablet (25 mg total) by mouth daily.  . Multiple Vitamin (MULTIVITAMIN WITH MINERALS) TABS tablet Take 1 tablet by mouth daily.  . Omega-3 Fatty Acids (FISH OIL PO) Take 1 tablet by mouth 2 (two) times daily. Rotates with Flaxseed  oil tabs  . vitamin C (ASCORBIC ACID) 500 MG tablet Take 500 mg by mouth daily.  . Vitamin D, Cholecalciferol, 1000 UNITS CAPS Take 1 capsule by mouth daily.  . vitamin E 1000 UNIT capsule Take 1,000 Units by mouth daily.   No facility-administered encounter medications on file as of 07/10/2017.     Activities of Daily Living In your present state of health, do you have any difficulty performing the following activities: 07/10/2017  Hearing? N  Vision? N  Difficulty concentrating or making decisions? N  Walking or climbing stairs? N  Dressing or bathing? N  Doing errands, shopping? N  Preparing Food and eating ? N  Using the Toilet? N  In the past six months, have you accidently leaked urine? Y  Do you have problems with loss of bowel control? N  Managing  your Medications? N  Managing your Finances? N  Housekeeping or managing your Housekeeping? N  Some recent data might be hidden    Patient Care Team: Tower, Wynelle Fanny, MD as PCP - General Birder Robson, MD as Referring Physician (Ophthalmology)    Assessment:    Physical assessment deferred to PCP.  Exercise Activities and Dietary recommendations Current Exercise Habits: Home exercise routine, Type of exercise: walking, Time (Minutes): 45, Frequency (Times/Week): 2, Weekly Exercise (Minutes/Week): 90  Goals    . Increase physical activity (pt-stated)          Starting 07/10/2017, I will restart exercising for at least 35 min 4 days per week as tolerated.       Fall Risk Fall Risk  07/10/2017 06/26/2016 06/26/2016 05/17/2015 04/07/2014  Falls in the past year? No No No No No   Depression Screen PHQ 2/9 Scores 07/10/2017 06/26/2016 06/26/2016 05/17/2015  PHQ - 2 Score 1 0 0 0     Cognitive Function PLEASE NOTE: A Mini-Cog screen was completed. Maximum score is 20. A value of 0 denotes this part of Folstein MMSE was not completed or the patient failed this part of the Mini-Cog screening.   Mini-Cog Screening Orientation to Time - Max 5 pts Orientation to Place - Max 5 pts Registration - Max 3 pts Recall - Max 3 pts Language Repeat - Max 1 pts Language Follow 3 Step Command - Max 3 pts      Mini-Cog - 07/10/17 1002    Normal clock drawing test? yes   How many words correct? 2      MMSE - Mini Mental State Exam 07/10/2017 06/26/2016  Orientation to time 5 5  Orientation to Place 5 5  Registration 3 3  Attention/ Calculation 0 0  Recall 2 3  Language- name 2 objects 0 0  Language- repeat 1 1  Language- follow 3 step command 3 3  Language- read & follow direction 0 0  Write a sentence 0 0  Copy design 0 0  Total score 19 20        Immunization History  Administered Date(s) Administered  . Influenza Whole 09/16/2008, 11/10/2009, 09/26/2010  . Influenza, High Dose  Seasonal PF 09/11/2014, 09/21/2015, 07/23/2016  . Influenza-Unspecified 09/16/2013  . Pneumococcal Conjugate-13 05/17/2015  . Pneumococcal Polysaccharide-23 03/24/2004, 11/24/2009, 04/07/2014  . Td 09/09/2006, 09/14/2009  . Zoster 12/06/2010   Screening Tests Health Maintenance  Topic Date Due  . INFLUENZA VACCINE  07/17/2017  . MAMMOGRAM  12/19/2017  . TETANUS/TDAP  09/15/2019  . COLONOSCOPY  11/20/2025  . DEXA SCAN  Completed  . Hepatitis C Screening  Completed  .  PNA vac Low Risk Adult  Completed      Plan:    Follow-up w/ PCP as scheduled.   I have personally reviewed and noted the following in the patient's chart:   . Medical and social history . Use of alcohol, tobacco or illicit drugs  . Current medications and supplements . Functional ability and status . Nutritional status . Physical activity . Advanced directives . List of other physicians . Vitals . Screenings to include cognitive, depression, and falls . Referrals and appointments  In addition, I have reviewed and discussed with patient certain preventive protocols, quality metrics, and best practice recommendations. A written personalized care plan for preventive services as well as general preventive health recommendations were provided to patient.     Dorrene German, RN  07/10/2017

## 2017-07-07 ENCOUNTER — Telehealth: Payer: Self-pay | Admitting: Family Medicine

## 2017-07-07 DIAGNOSIS — Z0000 Encounter for general adult medical examination without abnormal findings: Secondary | ICD-10-CM

## 2017-07-07 NOTE — Telephone Encounter (Signed)
-----   Message from Ellamae Sia sent at 07/03/2017  2:59 PM EDT ----- Regarding: Lab orders for Wednesday, 7.25.18  AWV lab orders, please.

## 2017-07-10 ENCOUNTER — Ambulatory Visit (INDEPENDENT_AMBULATORY_CARE_PROVIDER_SITE_OTHER): Payer: PPO

## 2017-07-10 ENCOUNTER — Other Ambulatory Visit (INDEPENDENT_AMBULATORY_CARE_PROVIDER_SITE_OTHER): Payer: PPO

## 2017-07-10 VITALS — BP 158/92 | HR 60 | Resp 16 | Ht 65.0 in | Wt 178.8 lb

## 2017-07-10 DIAGNOSIS — Z0000 Encounter for general adult medical examination without abnormal findings: Secondary | ICD-10-CM

## 2017-07-10 LAB — COMPREHENSIVE METABOLIC PANEL
ALT: 9 U/L (ref 0–35)
AST: 17 U/L (ref 0–37)
Albumin: 4.5 g/dL (ref 3.5–5.2)
Alkaline Phosphatase: 47 U/L (ref 39–117)
BUN: 14 mg/dL (ref 6–23)
CHLORIDE: 103 meq/L (ref 96–112)
CO2: 30 mEq/L (ref 19–32)
Calcium: 10.3 mg/dL (ref 8.4–10.5)
Creatinine, Ser: 0.92 mg/dL (ref 0.40–1.20)
GFR: 64.34 mL/min (ref >60.00)
GLUCOSE: 115 mg/dL — AB (ref 70–99)
POTASSIUM: 4 meq/L (ref 3.5–5.1)
SODIUM: 139 meq/L (ref 135–145)
Total Bilirubin: 0.7 mg/dL (ref 0.2–1.2)
Total Protein: 7.1 g/dL (ref 6.0–8.3)

## 2017-07-10 LAB — CBC WITH DIFFERENTIAL/PLATELET
BASOS PCT: 0.3 % (ref 0.0–3.0)
Basophils Absolute: 0 10*3/uL (ref 0.0–0.1)
EOS ABS: 0.1 10*3/uL (ref 0.0–0.7)
EOS PCT: 1.2 % (ref 0.0–5.0)
HCT: 41.6 % (ref 36.0–46.0)
Hemoglobin: 13.8 g/dL (ref 12.0–15.0)
LYMPHS ABS: 0.9 10*3/uL (ref 0.7–4.0)
Lymphocytes Relative: 13 % (ref 12.0–46.0)
MCHC: 33.3 g/dL (ref 30.0–36.0)
MCV: 99.5 fl (ref 78.0–100.0)
MONO ABS: 0.4 10*3/uL (ref 0.1–1.0)
Monocytes Relative: 6.3 % (ref 3.0–12.0)
NEUTROS PCT: 79.2 % — AB (ref 43.0–77.0)
Neutro Abs: 5.5 10*3/uL (ref 1.4–7.7)
Platelets: 239 10*3/uL (ref 150.0–400.0)
RBC: 4.18 Mil/uL (ref 3.87–5.11)
RDW: 13.1 % (ref 11.5–15.5)
WBC: 6.9 10*3/uL (ref 4.0–10.5)

## 2017-07-10 LAB — LIPID PANEL
CHOL/HDL RATIO: 4
Cholesterol: 234 mg/dL — ABNORMAL HIGH (ref 0–200)
HDL: 62.1 mg/dL (ref >39.00)
LDL CALC: 149 mg/dL — AB (ref 0–99)
NONHDL: 171.51
Triglycerides: 113 mg/dL (ref 0.0–149.0)
VLDL: 22.6 mg/dL (ref 0.0–40.0)

## 2017-07-10 LAB — TSH: TSH: 1.25 u[IU]/mL (ref 0.35–4.50)

## 2017-07-10 NOTE — Patient Instructions (Addendum)
Erin Hinton , Thank you for taking time to come for your Medicare Wellness Visit. I appreciate your ongoing commitment to your health goals. Please review the following plan we discussed and let me know if I can assist you in the future.   Bring a copy of your advance directives to your next office visit.  These are the goals we discussed: Goals    . Increase physical activity (pt-stated)          Starting 07/10/2017, I will restart exercising for at least 35 min 4 days per week as tolerated.        This is a list of the screening recommended for you and due dates:  Health Maintenance  Topic Date Due  . Flu Shot  07/17/2017  . Mammogram  12/19/2017  . Tetanus Vaccine  09/15/2019  . Colon Cancer Screening  11/20/2025  . DEXA scan (bone density measurement)  Completed  .  Hepatitis C: One time screening is recommended by Center for Disease Control  (CDC) for  adults born from [birth year] through 1963.   Completed  . Pneumonia vaccines  Completed   Preventive Care for Adults  A healthy lifestyle and preventive care can promote health and wellness. Preventive health guidelines for adults include the following key practices.  . A routine yearly physical is a good way to check with your health care provider about your health and preventive screening. It is a chance to share any concerns and updates on your health and to receive a thorough exam.  . Visit your dentist for a routine exam and preventive care every 6 months. Brush your teeth twice a day and floss once a day. Good oral hygiene prevents tooth decay and gum disease.  . The frequency of eye exams is based on your age, health, family medical history, use  of contact lenses, and other factors. Follow your health care provider's ecommendations for frequency of eye exams.  . Eat a healthy diet. Foods like vegetables, fruits, whole grains, low-fat dairy products, and lean protein foods contain the nutrients you need without too many  calories. Decrease your intake of foods high in solid fats, added sugars, and salt. Eat the right amount of calories for you. Get information about a proper diet from your health care provider, if necessary.  . Regular physical exercise is one of the most important things you can do for your health. Most adults should get at least 150 minutes of moderate-intensity exercise (any activity that increases your heart rate and causes you to sweat) each week. In addition, most adults need muscle-strengthening exercises on 2 or more days a week.  Silver Sneakers may be a benefit available to you. To determine eligibility, you may visit the website: www.silversneakers.com or contact program at 514-352-4008 Mon-Fri between 8AM-8PM.   . Maintain a healthy weight. The body mass index (BMI) is a screening tool to identify possible weight problems. It provides an estimate of body fat based on height and weight. Your health care provider can find your BMI and can help you achieve or maintain a healthy weight.   For adults 20 years and older: ? A BMI below 18.5 is considered underweight. ? A BMI of 18.5 to 24.9 is normal. ? A BMI of 25 to 29.9 is considered overweight. ? A BMI of 30 and above is considered obese.   . Maintain normal blood lipids and cholesterol levels by exercising and minimizing your intake of saturated fat. Eat a balanced diet with  plenty of fruit and vegetables. Blood tests for lipids and cholesterol should begin at age 81 and be repeated every 5 years. If your lipid or cholesterol levels are high, you are over 50, or you are at high risk for heart disease, you may need your cholesterol levels checked more frequently. Ongoing high lipid and cholesterol levels should be treated with medicines if diet and exercise are not working.  . If you smoke, find out from your health care provider how to quit. If you do not use tobacco, please do not start.  . If you choose to drink alcohol, please do  not consume more than 2 drinks per day. One drink is considered to be 12 ounces (355 mL) of beer, 5 ounces (148 mL) of wine, or 1.5 ounces (44 mL) of liquor.  . If you are 68-39 years old, ask your health care provider if you should take aspirin to prevent strokes.  . Use sunscreen. Apply sunscreen liberally and repeatedly throughout the day. You should seek shade when your shadow is shorter than you. Protect yourself by wearing long sleeves, pants, a wide-brimmed hat, and sunglasses year round, whenever you are outdoors.  . Once a month, do a whole body skin exam, using a mirror to look at the skin on your back. Tell your health care provider of new moles, moles that have irregular borders, moles that are larger than a pencil eraser, or moles that have changed in shape or color.

## 2017-07-15 ENCOUNTER — Ambulatory Visit (INDEPENDENT_AMBULATORY_CARE_PROVIDER_SITE_OTHER): Payer: PPO | Admitting: Family Medicine

## 2017-07-15 ENCOUNTER — Encounter: Payer: Self-pay | Admitting: Family Medicine

## 2017-07-15 VITALS — BP 132/70 | HR 71 | Temp 97.7°F | Ht 65.0 in | Wt 178.5 lb

## 2017-07-15 DIAGNOSIS — Z0000 Encounter for general adult medical examination without abnormal findings: Secondary | ICD-10-CM | POA: Diagnosis not present

## 2017-07-15 DIAGNOSIS — I10 Essential (primary) hypertension: Secondary | ICD-10-CM

## 2017-07-15 DIAGNOSIS — E7800 Pure hypercholesterolemia, unspecified: Secondary | ICD-10-CM | POA: Diagnosis not present

## 2017-07-15 DIAGNOSIS — M899 Disorder of bone, unspecified: Secondary | ICD-10-CM

## 2017-07-15 DIAGNOSIS — Z8249 Family history of ischemic heart disease and other diseases of the circulatory system: Secondary | ICD-10-CM | POA: Diagnosis not present

## 2017-07-15 DIAGNOSIS — R7303 Prediabetes: Secondary | ICD-10-CM

## 2017-07-15 DIAGNOSIS — F419 Anxiety disorder, unspecified: Secondary | ICD-10-CM

## 2017-07-15 DIAGNOSIS — M949 Disorder of cartilage, unspecified: Secondary | ICD-10-CM

## 2017-07-15 MED ORDER — ROSUVASTATIN CALCIUM 5 MG PO TABS: 5.0000 mg | ORAL_TABLET | ORAL | 3 refills | Status: DC

## 2017-07-15 MED ORDER — ESCITALOPRAM OXALATE 20 MG PO TABS: 20.0000 mg | ORAL_TABLET | Freq: Every day | ORAL | 3 refills | Status: DC

## 2017-07-15 MED ORDER — FLUTICASONE PROPIONATE 50 MCG/ACT NA SUSP: 2.0000 | Freq: Every day | NASAL | 3 refills | Status: DC

## 2017-07-15 MED ORDER — HYDROCHLOROTHIAZIDE 25 MG PO TABS: 25.0000 mg | ORAL_TABLET | Freq: Every day | ORAL | 3 refills | Status: DC

## 2017-07-15 NOTE — Assessment & Plan Note (Signed)
Glucose 115 disc imp of low glycemic diet and wt loss to prevent DM2  Will plan on a1c with next labs

## 2017-07-15 NOTE — Assessment & Plan Note (Signed)
Pt plans to get her dexa with mammogram in January Will call for ref  On ca and D No falls or fx  Prev smoker

## 2017-07-15 NOTE — Patient Instructions (Addendum)
When you are planning your mammogram - call us for bone density test referral   For cholesterol I want to try crestor 1/2 dose every other day and see how you tolerate it (stop if if not tolerated)  Avoid red meat/ fried foods/ egg yolks/ fatty breakfast meats/ butter, cheese and high fat dairy/ and shellfish   Lab in 6 weeks   Increase lexapro to 20 mg daily for mood   Blood pressure is better on 2nd check

## 2017-07-15 NOTE — Progress Notes (Signed)
Subjective:    Patient ID: Doran Heater, female    DOB: 1946/04/02, 71 y.o.   MRN: [id number]  HPI Here for health maintenance exam and to review chronic medical problems    Doing ok overall  Energy level is not as good as it used to be  Eats healthy some of the time   Stress  Daughter with breast cancer  More anxiety from that  Lost sister and a best friend (a lot of loss)     Had amw 7/25 Mini cog 19/20  (recall 2 of 3)- remembered minutes later however  No worries about memory  Discussed that sister died of MI related to chemo (?) -unsure -she had smoked years before  bp was high -said she was stressed  Wt Readings from Last 3 Encounters:  07/15/17 178 lb 8 oz (81 kg)  07/10/17 178 lb 12 oz (81.1 kg)  07/10/16 171 lb 12 oz (77.9 kg)  exercises when she can -walking and yard work  She stays busy with grand kids  Not enough time for self care  29.70 kg/m   dexa 12/11 osteopenia  She wants to do with her mammogram in January  Taking ca and D   Pap 08 No gyn problems  Does not go to gyn  Mammogram 1/18  Sister and daughter had breast cancer   (lost her sister)  Mother had ovarian cancer  Negative  Self exam -normal   Colonoscopy 12/16 nl with 10 y recall   zostavax 12/11  Former smoker quit 2/17 Still a non smoker- proud   bp is improved today - but still a little high  (lost a dog in the family)  No cp or palpitations or headaches or edema  No side effects to medicines  BP Readings from Last 3 Encounters:  07/15/17 (!) 142/78  07/10/17 (!) 158/92  07/10/16 128/78      Hyperlipidemia Lab Results  Component Value Date   CHOL 234 (H) 07/10/2017   CHOL 222 (H) 06/22/2016   CHOL 182 05/11/2015   Lab Results  Component Value Date   HDL 62.10 07/10/2017   HDL 60.80 06/22/2016   HDL 53.70 05/11/2015   Lab Results  Component Value Date   LDLCALC 149 (H) 07/10/2017   LDLCALC 144 (H) 06/22/2016   LDLCALC 111 (H) 05/11/2015   Lab Results    Component Value Date   TRIG 113.0 07/10/2017   TRIG 85.0 06/22/2016   TRIG 85.0 05/11/2015   Lab Results  Component Value Date   CHOLHDL 4 07/10/2017   CHOLHDL 4 06/22/2016   CHOLHDL 3 05/11/2015   Lab Results  Component Value Date   LDLDIRECT 128.6 11/28/2010   LDLDIRECT 152.1 11/24/2009   LDLDIRECT 138.9 11/23/2008   Side effects with atorvastatin  Eats chicken/ no fried food/fast food  Too many sweets   Glucose 115 - a little high  No DM in family     Chemistry      Component Value Date/Time   NA 139 07/10/2017 1040   K 4.0 07/10/2017 1040   CL 103 07/10/2017 1040   CO2 30 07/10/2017 1040   BUN 14 07/10/2017 1040   CREATININE 0.92 07/10/2017 1040      Component Value Date/Time   CALCIUM 10.3 07/10/2017 1040   ALKPHOS 47 07/10/2017 1040   AST 17 07/10/2017 1040   ALT 9 07/10/2017 1040   BILITOT 0.7 07/10/2017 1040      Lab Results  Component Value  Date   WBC 6.9 07/10/2017   HGB 13.8 07/10/2017   HCT 41.6 07/10/2017   MCV 99.5 07/10/2017   PLT 239.0 07/10/2017   Lab Results  Component Value Date   TSH 1.25 07/10/2017     Patient Active Problem List   Diagnosis Date Noted  . Family history of heart disease 07/15/2017  . Prediabetes 07/15/2017  . Encounter for Medicare annual wellness exam 04/07/2014  . Colon cancer screening 03/09/2013  . Routine general medical examination at a health care facility 01/30/2012  . HYPERTENSION, BENIGN ESSENTIAL 02/01/2010  . Disorder of bone and cartilage 11/23/2008  . Former smoker 10/21/2007  . Hyperlipidemia 09/29/2007  . MITRAL VALVE PROLAPSE 09/29/2007  . 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 ALLERGY 09/29/2007   Past Medical History:  Diagnosis Date  . Anxiety   . Cancer ([place])    skin  . Depression   . Heart murmur    in her 30's and it is not a problem now  . Hyperlipidemia   . Hyperlipidemia   . Hypertension   . Osteopenia   . Shortness of breath dyspnea    due to excercise only   Past  Surgical History:  Procedure Laterality Date  . BREAST BIOPSY Right 01/2002   neg. no lymph node involvement  . 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 CATARACT EXTRACTION W/PHACO Right 05/01/2016   Procedure: CATARACT EXTRACTION PHACO AND INTRAOCULAR LENS PLACEMENT (IOC);  Surgeon: Birder Robson, MD;  Location: [facility] ORS;  Service: Ophthalmology;  Laterality: Right;  [place] 00:53 AP% 21.7 CDE 11.50 fluid pack lot # [id number]  . CATARACT EXTRACTION W/PHACO Left 06/05/2016   Procedure: CATARACT EXTRACTION PHACO AND INTRAOCULAR LENS PLACEMENT ([place]);  Surgeon: Birder Robson, MD;  Location: [facility] ORS;  Service: Ophthalmology;  Laterality: Left;  [place] 01:04 AP% 22.2 CDE 14.34 fluid pack lot # [id number]  . COLONOSCOPY WITH PROPOFOL N/A 11/21/2015   Procedure: COLONOSCOPY WITH PROPOFOL;  Surgeon: Hulen Luster, MD;  Location: [facility];  Service: Gastroenterology;  Laterality: N/A;   Social History  Substance Use Topics  . Smoking status: Former Smoker    Packs/day: 0.50    Years: 45.00    Types: Cigarettes    Quit date: 01/22/2016  . Smokeless tobacco: Never Used     Comment: also uses e-cigaretts on occasion  . Alcohol use No   Family History  Problem Relation Age of Onset  . Lymphoma Father   . Heart disease Father        MI  . Hypertension Father   . Alcohol abuse Brother   . Cancer Maternal Aunt        breast cancer  . Breast cancer Sister   . Breast cancer Daughter    Allergies  Allergen Reactions  . Alendronate Sodium     REACTION: GI  . Atorvastatin     REACTION: Psin  . Evista [Raloxifene] Other (See Comments)    Breast tenderness  . Sertraline Hcl     Did not help, made anxiety worse  . Pseudoephedrine     REACTION: palpitations   Current Outpatient Prescriptions on File Prior to Visit  Medication Sig Dispense Refill  . aspirin EC 81 MG tablet Take 1 tablet by mouth daily as needed.    . Calcium Carb-Cholecalciferol (CALCIUM 600 + D PO) Take 1 capsule by  mouth daily.    . Coconut Oil 1000 MG CAPS Take 1,000 mg by mouth daily.    Marland Kitchen  Multiple Vitamin (MULTIVITAMIN WITH MINERALS) TABS tablet Take 1 tablet by mouth daily.    . Omega-3 Fatty Acids (FISH OIL PO) Take 1 tablet by mouth 2 (two) times daily. Rotates with Flaxseed oil tabs    . vitamin C (ASCORBIC ACID) 500 MG tablet Take 500 mg by mouth daily.    . Vitamin D, Cholecalciferol, 1000 UNITS CAPS Take 1 capsule by mouth daily.    . vitamin E 1000 UNIT capsule Take 1,000 Units by mouth daily.     No current facility-administered medications on file prior to visit.     Review of Systems Review of Systems  Constitutional: Negative for fever, appetite change, fatigue and unexpected weight change.  Eyes: Negative for pain and visual disturbance.  Respiratory: Negative for cough and shortness of breath.   Cardiovascular: Negative for cp or palpitations    Gastrointestinal: Negative for nausea, diarrhea and constipation.  Genitourinary: Negative for urgency and frequency.  Skin: Negative for pallor or rash   Neurological: Negative for weakness, light-headedness, numbness and headaches.  Hematological: Negative for adenopathy. Does not bruise/bleed easily.  Psychiatric/Behavioral: Negative for dysphoric mood. Pos for worsened anx with stressors/ neg for SI       Objective:   Physical Exam  Constitutional: She appears well-developed and well-nourished. No distress.  Well appearing   HENT:  Head: Normocephalic and atraumatic.  Right Ear: External ear normal.  Left Ear: External ear normal.  Mouth/Throat: Oropharynx is clear and moist.  Eyes: Pupils are equal, round, and reactive to light. Conjunctivae and EOM are normal. No scleral icterus.  Neck: Normal range of motion. Neck supple. No JVD present. Carotid bruit is not present. No thyromegaly present.  Cardiovascular: Normal rate, regular rhythm, normal heart sounds and intact distal pulses.  Exam reveals no gallop.   Pulmonary/Chest:  Effort normal and breath sounds normal. No respiratory distress. She has no wheezes. She exhibits no tenderness.  Diffusely distant bs   Abdominal: Soft. Bowel sounds are normal. She exhibits no distension, no abdominal bruit and no mass. There is no tenderness.  Genitourinary: No breast swelling, tenderness, discharge or bleeding.  Genitourinary Comments: Breast exam: No mass, nodules, thickening, tenderness, bulging, retraction, inflamation, nipple discharge or skin changes noted.  No axillary or clavicular LA.      Musculoskeletal: Normal range of motion. She exhibits no edema or tenderness.  Lymphadenopathy:    She has no cervical adenopathy.  Neurological: She is alert. She has normal reflexes. No cranial nerve deficit. She exhibits normal muscle tone. Coordination normal.  Skin: Skin is warm and dry. No rash noted. No erythema. No pallor.  Solar lentigines diffusely  Also solar aging  Psychiatric:  Mildly anxious today but pleasant and attentive           Assessment & Plan:   Problem List Items Addressed This Visit      Cardiovascular and Mediastinum   HYPERTENSION, BENIGN ESSENTIAL - Primary    bp in fair control at this time  BP Readings from Last 1 Encounters:  07/15/17 132/70   No changes needed Disc lifstyle change with low sodium diet and exercise  Labs reviewed       Relevant Medications   hydrochlorothiazide (HYDRODIURIL) 25 MG tablet   rosuvastatin (CRESTOR) 5 MG tablet     Musculoskeletal and Integument   Disorder of bone and cartilage    Pt plans to get her dexa with mammogram in January Will call for ref  On ca and D No falls  or fx  Prev smoker         Other   Anxiety    Inc lexapro to 20 mg daily for anx from more recent stressors and loss  Reviewed stressors/ coping techniques/symptoms/ support sources/ tx options and side effects in detail today Discussed expectations of SSRI medication including time to effectiveness and mechanism of action,  also poss of side effects (early and late)- including mental fuzziness, weight or appetite change, nausea and poss of worse dep or anxiety (even suicidal thoughts)  Pt voiced understanding and will stop med and update if this occurs        Relevant Medications   escitalopram (LEXAPRO) 20 MG tablet   Family history of heart disease    Considering cardiology ref  Disc imp of chol control- trial of qod crestor      Hyperlipidemia    Disc goals for lipids and reasons to control them Rev labs with pt Rev low sat fat diet in detail Intol of statins  Now has fam hx of CAD Will try crestor 5 mg qod and see if tolerated Disc goals for lipids and reasons to control them Rev labs with pt Rev low sat fat diet in detail Lab 6 wk      Relevant Medications   hydrochlorothiazide (HYDRODIURIL) 25 MG tablet   rosuvastatin (CRESTOR) 5 MG tablet   Prediabetes    Glucose 115 disc imp of low glycemic diet and wt loss to prevent DM2  Will plan on a1c with next labs       Routine general medical examination at a health care facility    Reviewed health habits including diet and exercise and skin cancer prevention Reviewed appropriate screening tests for age  Also reviewed health mt list, fam hx and immunization status , as well as social and family history   Lab rev amw rev See HPI Will consider cardiology consult for reason-new fam heart dz and try low dose qod statin  dexa -will do with mammo in jan

## 2017-07-15 NOTE — Assessment & Plan Note (Signed)
Reviewed health habits including diet and exercise and skin cancer prevention Reviewed appropriate screening tests for age  Also reviewed health mt list, fam hx and immunization status , as well as social and family history   Lab rev amw rev See HPI Will consider cardiology consult for reason-new fam heart dz and try low dose qod statin  dexa -will do with mammo in jan

## 2017-07-15 NOTE — Assessment & Plan Note (Signed)
bp in fair control at this time  BP Readings from Last 1 Encounters:  07/15/17 132/70   No changes needed Disc lifstyle change with low sodium diet and exercise  Labs reviewed

## 2017-07-15 NOTE — Assessment & Plan Note (Signed)
Inc lexapro to 20 mg daily for anx from more recent stressors and loss  Reviewed stressors/ coping techniques/symptoms/ support sources/ tx options and side effects in detail today Discussed expectations of SSRI medication including time to effectiveness and mechanism of action, also poss of side effects (early and late)- including mental fuzziness, weight or appetite change, nausea and poss of worse dep or anxiety (even suicidal thoughts)  Pt voiced understanding and will stop med and update if this occurs

## 2017-07-15 NOTE — Assessment & Plan Note (Signed)
Disc goals for lipids and reasons to control them Rev labs with pt Rev low sat fat diet in detail Intol of statins  Now has fam hx of CAD Will try crestor 5 mg qod and see if tolerated Disc goals for lipids and reasons to control them Rev labs with pt Rev low sat fat diet in detail Lab 6 wk

## 2017-07-15 NOTE — Assessment & Plan Note (Signed)
Considering cardiology ref  Disc imp of chol control- trial of qod crestor

## 2017-07-18 ENCOUNTER — Telehealth: Payer: Self-pay | Admitting: *Deleted

## 2017-07-18 NOTE — Telephone Encounter (Signed)
-----   Message from Abner Greenspan, MD sent at 07/15/2017  1:48 PM EDT ----- We were going to do EKG at her visit but forgot (and there is a chance ins may not have paid for it  Please call her later this week and ask if she would like to see cardiology for a consult to discuss her fam hx of heart dz and her risk factors -then let me know  Thanks

## 2017-07-18 NOTE — Telephone Encounter (Signed)
Pt notified of Dr. Marliss Coots comments. She said she thinks with her insurance she doesn't need a referral she can just schedule her own cardiology appt. Pt will check with her insurance and if she decides to see cardiology she will schedule her own appt. If she does need a referral she will call us back but she's okay for now

## 2017-08-25 ENCOUNTER — Telehealth: Payer: Self-pay | Admitting: Family Medicine

## 2017-08-25 DIAGNOSIS — E7800 Pure hypercholesterolemia, unspecified: Secondary | ICD-10-CM

## 2017-08-25 NOTE — Telephone Encounter (Signed)
-----   Message from Ellamae Sia sent at 08/16/2017  9:32 AM EDT ----- Regarding: Lab orders for Monday 9.10.18 Lab orders for a 6 wk f/u

## 2017-08-26 ENCOUNTER — Other Ambulatory Visit (INDEPENDENT_AMBULATORY_CARE_PROVIDER_SITE_OTHER): Payer: PPO

## 2017-08-26 DIAGNOSIS — E7800 Pure hypercholesterolemia, unspecified: Secondary | ICD-10-CM | POA: Diagnosis not present

## 2017-08-26 LAB — LIPID PANEL
CHOL/HDL RATIO: 3
Cholesterol: 185 mg/dL (ref 0–200)
HDL: 55.1 mg/dL (ref >39.00)
LDL Cholesterol: 115 mg/dL — ABNORMAL HIGH (ref 0–99)
NONHDL: 129.59
TRIGLYCERIDES: 74 mg/dL (ref 0.0–149.0)
VLDL: 14.8 mg/dL (ref 0.0–40.0)

## 2017-08-26 LAB — ALT: ALT: 10 U/L (ref 0–35)

## 2017-08-26 LAB — AST: AST: 18 U/L (ref 0–37)

## 2017-12-02 ENCOUNTER — Other Ambulatory Visit: Payer: Self-pay | Admitting: Family Medicine

## 2017-12-02 DIAGNOSIS — Z1231 Encounter for screening mammogram for malignant neoplasm of breast: Secondary | ICD-10-CM

## 2018-01-03 ENCOUNTER — Ambulatory Visit
Admission: RE | Admit: 2018-01-03 | Discharge: 2018-01-03 | Disposition: A | Discharge status: Home / Self Care | Payer: PPO | Source: Ambulatory Visit | Attending: Family Medicine | Admitting: Family Medicine

## 2018-01-03 DIAGNOSIS — Z1231 Encounter for screening mammogram for malignant neoplasm of breast: Secondary | ICD-10-CM | POA: Diagnosis not present

## 2018-02-10 DIAGNOSIS — X32XXXA Exposure to sunlight, initial encounter: Secondary | ICD-10-CM | POA: Diagnosis not present

## 2018-02-10 DIAGNOSIS — D0462 Carcinoma in situ of skin of left upper limb, including shoulder: Secondary | ICD-10-CM | POA: Diagnosis not present

## 2018-02-10 DIAGNOSIS — Z08 Encounter for follow-up examination after completed treatment for malignant neoplasm: Secondary | ICD-10-CM | POA: Diagnosis not present

## 2018-02-10 DIAGNOSIS — D170 Benign lipomatous neoplasm of skin and subcutaneous tissue of head, face and neck: Secondary | ICD-10-CM | POA: Diagnosis not present

## 2018-02-10 DIAGNOSIS — D485 Neoplasm of uncertain behavior of skin: Secondary | ICD-10-CM | POA: Diagnosis not present

## 2018-02-10 DIAGNOSIS — L570 Actinic keratosis: Secondary | ICD-10-CM | POA: Diagnosis not present

## 2018-02-10 DIAGNOSIS — Z85828 Personal history of other malignant neoplasm of skin: Secondary | ICD-10-CM | POA: Diagnosis not present

## 2018-02-10 DIAGNOSIS — L820 Inflamed seborrheic keratosis: Secondary | ICD-10-CM | POA: Diagnosis not present

## 2018-03-28 DIAGNOSIS — D0462 Carcinoma in situ of skin of left upper limb, including shoulder: Secondary | ICD-10-CM | POA: Diagnosis not present

## 2018-03-28 DIAGNOSIS — C44629 Squamous cell carcinoma of skin of left upper limb, including shoulder: Secondary | ICD-10-CM | POA: Diagnosis not present

## 2018-06-03 DIAGNOSIS — H26492 Other secondary cataract, left eye: Secondary | ICD-10-CM | POA: Diagnosis not present

## 2018-06-13 DIAGNOSIS — H26491 Other secondary cataract, right eye: Secondary | ICD-10-CM | POA: Diagnosis not present

## 2018-06-23 ENCOUNTER — Other Ambulatory Visit: Payer: Self-pay | Admitting: Physician Assistant

## 2018-06-23 ENCOUNTER — Ambulatory Visit
Admission: RE | Admit: 2018-06-23 | Discharge: 2018-06-23 | Disposition: A | Discharge status: Home / Self Care | Payer: PPO | Source: Ambulatory Visit | Attending: Physician Assistant | Admitting: Physician Assistant

## 2018-06-23 DIAGNOSIS — R2242 Localized swelling, mass and lump, left lower limb: Secondary | ICD-10-CM | POA: Diagnosis not present

## 2018-06-23 DIAGNOSIS — M7989 Other specified soft tissue disorders: Secondary | ICD-10-CM | POA: Diagnosis not present

## 2018-06-26 DIAGNOSIS — M25562 Pain in left knee: Secondary | ICD-10-CM | POA: Diagnosis not present

## 2018-06-26 DIAGNOSIS — R2242 Localized swelling, mass and lump, left lower limb: Secondary | ICD-10-CM | POA: Diagnosis not present

## 2018-07-03 DIAGNOSIS — R2242 Localized swelling, mass and lump, left lower limb: Secondary | ICD-10-CM | POA: Diagnosis not present

## 2018-07-03 DIAGNOSIS — M25562 Pain in left knee: Secondary | ICD-10-CM | POA: Diagnosis not present

## 2018-07-10 DIAGNOSIS — M25562 Pain in left knee: Secondary | ICD-10-CM | POA: Diagnosis not present

## 2018-07-16 ENCOUNTER — Ambulatory Visit: Payer: Self-pay

## 2018-07-21 DIAGNOSIS — M25562 Pain in left knee: Secondary | ICD-10-CM | POA: Diagnosis not present

## 2018-07-23 ENCOUNTER — Encounter: Payer: Self-pay | Admitting: Family Medicine

## 2018-07-25 DIAGNOSIS — M25562 Pain in left knee: Secondary | ICD-10-CM | POA: Diagnosis not present

## 2018-07-31 ENCOUNTER — Other Ambulatory Visit: Payer: Self-pay | Admitting: *Deleted

## 2018-07-31 MED ORDER — ESCITALOPRAM OXALATE 20 MG PO TABS: 20.0000 mg | ORAL_TABLET | Freq: Every day | ORAL | 1 refills | Status: DC

## 2018-08-13 ENCOUNTER — Encounter: Payer: Self-pay | Admitting: Family Medicine

## 2018-08-14 DIAGNOSIS — M25562 Pain in left knee: Secondary | ICD-10-CM | POA: Diagnosis not present

## 2018-08-15 DIAGNOSIS — D225 Melanocytic nevi of trunk: Secondary | ICD-10-CM | POA: Diagnosis not present

## 2018-08-15 DIAGNOSIS — D2262 Melanocytic nevi of left upper limb, including shoulder: Secondary | ICD-10-CM | POA: Diagnosis not present

## 2018-08-15 DIAGNOSIS — L821 Other seborrheic keratosis: Secondary | ICD-10-CM | POA: Diagnosis not present

## 2018-08-15 DIAGNOSIS — D2261 Melanocytic nevi of right upper limb, including shoulder: Secondary | ICD-10-CM | POA: Diagnosis not present

## 2018-08-15 DIAGNOSIS — D0462 Carcinoma in situ of skin of left upper limb, including shoulder: Secondary | ICD-10-CM | POA: Diagnosis not present

## 2018-08-15 DIAGNOSIS — Z85828 Personal history of other malignant neoplasm of skin: Secondary | ICD-10-CM | POA: Diagnosis not present

## 2018-08-15 DIAGNOSIS — X32XXXA Exposure to sunlight, initial encounter: Secondary | ICD-10-CM | POA: Diagnosis not present

## 2018-08-15 DIAGNOSIS — Z08 Encounter for follow-up examination after completed treatment for malignant neoplasm: Secondary | ICD-10-CM | POA: Diagnosis not present

## 2018-08-15 DIAGNOSIS — D2272 Melanocytic nevi of left lower limb, including hip: Secondary | ICD-10-CM | POA: Diagnosis not present

## 2018-08-15 DIAGNOSIS — D485 Neoplasm of uncertain behavior of skin: Secondary | ICD-10-CM | POA: Diagnosis not present

## 2018-08-15 DIAGNOSIS — D2271 Melanocytic nevi of right lower limb, including hip: Secondary | ICD-10-CM | POA: Diagnosis not present

## 2018-08-15 DIAGNOSIS — L570 Actinic keratosis: Secondary | ICD-10-CM | POA: Diagnosis not present

## 2018-08-19 DIAGNOSIS — M25562 Pain in left knee: Secondary | ICD-10-CM | POA: Diagnosis not present

## 2018-08-20 ENCOUNTER — Telehealth: Payer: Self-pay | Admitting: Family Medicine

## 2018-08-20 DIAGNOSIS — R7303 Prediabetes: Secondary | ICD-10-CM

## 2018-08-20 DIAGNOSIS — E7800 Pure hypercholesterolemia, unspecified: Secondary | ICD-10-CM

## 2018-08-20 DIAGNOSIS — Z0000 Encounter for general adult medical examination without abnormal findings: Secondary | ICD-10-CM

## 2018-08-20 DIAGNOSIS — I10 Essential (primary) hypertension: Secondary | ICD-10-CM

## 2018-08-20 NOTE — Telephone Encounter (Signed)
-----   Message from Eustace Pen, LPN sent at 12/20/1028  7:59 AM EDT ----- Regarding: Labs 9/5 Lab orders needed. Thank you.  Insurance:  [employer]

## 2018-08-21 ENCOUNTER — Ambulatory Visit (INDEPENDENT_AMBULATORY_CARE_PROVIDER_SITE_OTHER): Payer: PPO

## 2018-08-21 VITALS — BP 118/70 | HR 67 | Temp 98.2°F | Ht 65.0 in | Wt 177.0 lb

## 2018-08-21 DIAGNOSIS — Z0000 Encounter for general adult medical examination without abnormal findings: Secondary | ICD-10-CM

## 2018-08-21 DIAGNOSIS — E7800 Pure hypercholesterolemia, unspecified: Secondary | ICD-10-CM | POA: Diagnosis not present

## 2018-08-21 DIAGNOSIS — R7303 Prediabetes: Secondary | ICD-10-CM

## 2018-08-21 DIAGNOSIS — I10 Essential (primary) hypertension: Secondary | ICD-10-CM

## 2018-08-21 LAB — CBC WITH DIFFERENTIAL/PLATELET
BASOS ABS: 0 10*3/uL (ref 0.0–0.1)
Basophils Relative: 0.5 % (ref 0.0–3.0)
EOS ABS: 0.1 10*3/uL (ref 0.0–0.7)
Eosinophils Relative: 1.6 % (ref 0.0–5.0)
HCT: 39.6 % (ref 36.0–46.0)
HEMOGLOBIN: 13.5 g/dL (ref 12.0–15.0)
Lymphocytes Relative: 15.6 % (ref 12.0–46.0)
Lymphs Abs: 1 10*3/uL (ref 0.7–4.0)
MCHC: 34.1 g/dL (ref 30.0–36.0)
MCV: 97.3 fl (ref 78.0–100.0)
MONO ABS: 0.5 10*3/uL (ref 0.1–1.0)
Monocytes Relative: 8.1 % (ref 3.0–12.0)
Neutro Abs: 4.7 10*3/uL (ref 1.4–7.7)
Neutrophils Relative %: 74.2 % (ref 43.0–77.0)
Platelets: 247 10*3/uL (ref 150.0–400.0)
RBC: 4.07 Mil/uL (ref 3.87–5.11)
RDW: 13.5 % (ref 11.5–15.5)
WBC: 6.3 10*3/uL (ref 4.0–10.5)

## 2018-08-21 LAB — COMPREHENSIVE METABOLIC PANEL
ALBUMIN: 4.5 g/dL (ref 3.5–5.2)
ALK PHOS: 56 U/L (ref 39–117)
ALT: 12 U/L (ref 0–35)
AST: 16 U/L (ref 0–37)
BILIRUBIN TOTAL: 0.7 mg/dL (ref 0.2–1.2)
BUN: 22 mg/dL (ref 6–23)
CO2: 32 mEq/L (ref 19–32)
CREATININE: 0.85 mg/dL (ref 0.40–1.20)
Calcium: 10.3 mg/dL (ref 8.4–10.5)
Chloride: 102 mEq/L (ref 96–112)
GFR: 70.27 mL/min (ref >60.00)
Glucose, Bld: 111 mg/dL — ABNORMAL HIGH (ref 70–99)
Potassium: 4.5 mEq/L (ref 3.5–5.1)
SODIUM: 139 meq/L (ref 135–145)
TOTAL PROTEIN: 7.2 g/dL (ref 6.0–8.3)

## 2018-08-21 LAB — HEMOGLOBIN A1C: Hgb A1c MFr Bld: 5.8 % (ref 4.6–6.5)

## 2018-08-21 LAB — LIPID PANEL
CHOLESTEROL: 230 mg/dL — AB (ref 0–200)
HDL: 62.1 mg/dL (ref >39.00)
LDL Cholesterol: 148 mg/dL — ABNORMAL HIGH (ref 0–99)
NonHDL: 167.82
Total CHOL/HDL Ratio: 4
Triglycerides: 98 mg/dL (ref 0.0–149.0)
VLDL: 19.6 mg/dL (ref 0.0–40.0)

## 2018-08-21 LAB — TSH: TSH: 1.32 u[IU]/mL (ref 0.35–4.50)

## 2018-08-21 NOTE — Patient Instructions (Signed)
Ms. Vankuren , Thank you for taking time to come for your Medicare Wellness Visit. I appreciate your ongoing commitment to your health goals. Please review the following plan we discussed and let me know if I can assist you in the future.   These are the goals we discussed: Goals    . Patient Stated     Starting 08/21/2018, I will continue to take medications as prescribed.        This is a list of the screening recommended for you and due dates:  Health Maintenance  Topic Date Due  . Flu Shot  03/18/2019*  . Mammogram  01/03/2019  . Tetanus Vaccine  09/15/2019  . Colon Cancer Screening  11/20/2025  . DEXA scan (bone density measurement)  Completed  .  Hepatitis C: One time screening is recommended by Center for Disease Control  (CDC) for  adults born from [birth year] through 1963.   Completed  . Pneumonia vaccines  Completed  *Topic was postponed. The date shown is not the original due date.   Preventive Care for Adults  A healthy lifestyle and preventive care can promote health and wellness. Preventive health guidelines for adults include the following key practices.  . A routine yearly physical is a good way to check with your health care provider about your health and preventive screening. It is a chance to share any concerns and updates on your health and to receive a thorough exam.  . Visit your dentist for a routine exam and preventive care every 6 months. Brush your teeth twice a day and floss once a day. Good oral hygiene prevents tooth decay and gum disease.  . The frequency of eye exams is based on your age, health, family medical history, use  of contact lenses, and other factors. Follow your health care provider's recommendations for frequency of eye exams.  . Eat a healthy diet. Foods like vegetables, fruits, whole grains, low-fat dairy products, and lean protein foods contain the nutrients you need without too many calories. Decrease your intake of foods high in solid fats,  added sugars, and salt. Eat the right amount of calories for you. Get information about a proper diet from your health care provider, if necessary.  . Regular physical exercise is one of the most important things you can do for your health. Most adults should get at least 150 minutes of moderate-intensity exercise (any activity that increases your heart rate and causes you to sweat) each week. In addition, most adults need muscle-strengthening exercises on 2 or more days a week.  Silver Sneakers may be a benefit available to you. To determine eligibility, you may visit the website: www.silversneakers.com or contact program at 740-329-7221 Mon-Fri between 8AM-8PM.   . Maintain a healthy weight. The body mass index (BMI) is a screening tool to identify possible weight problems. It provides an estimate of body fat based on height and weight. Your health care provider can find your BMI and can help you achieve or maintain a healthy weight.   For adults 20 years and older: ? A BMI below 18.5 is considered underweight. ? A BMI of 18.5 to 24.9 is normal. ? A BMI of 25 to 29.9 is considered overweight. ? A BMI of 30 and above is considered obese.   . Maintain normal blood lipids and cholesterol levels by exercising and minimizing your intake of saturated fat. Eat a balanced diet with plenty of fruit and vegetables. Blood tests for lipids and cholesterol should begin at  age 41 and be repeated every 5 years. If your lipid or cholesterol levels are high, you are over 50, or you are at high risk for heart disease, you may need your cholesterol levels checked more frequently. Ongoing high lipid and cholesterol levels should be treated with medicines if diet and exercise are not working.  . If you smoke, find out from your health care provider how to quit. If you do not use tobacco, please do not start.  . If you choose to drink alcohol, please do not consume more than 2 drinks per day. One drink is  considered to be 12 ounces (355 mL) of beer, 5 ounces (148 mL) of wine, or 1.5 ounces (44 mL) of liquor.  . If you are 61-37 years old, ask your health care provider if you should take aspirin to prevent strokes.  . Use sunscreen. Apply sunscreen liberally and repeatedly throughout the day. You should seek shade when your shadow is shorter than you. Protect yourself by wearing long sleeves, pants, a wide-brimmed hat, and sunglasses year round, whenever you are outdoors.  . Once a month, do a whole body skin exam, using a mirror to look at the skin on your back. Tell your health care provider of new moles, moles that have irregular borders, moles that are larger than a pencil eraser, or moles that have changed in shape or color.

## 2018-08-21 NOTE — Progress Notes (Signed)
Subjective:   KATELIND PYTEL is a 72 y.o. female who presents for Medicare Annual (Subsequent) preventive examination.  Review of Systems:  N/A Cardiac Risk Factors include: advanced age (>68men, >67 women);dyslipidemia;hypertension;smoking/ tobacco exposure     Objective:     Vitals: BP 118/70 (BP Location: Right Arm, Patient Position: Sitting, Cuff Size: Normal)   Pulse 67   Temp 98.2 F (36.8 C) (Oral)   Ht 5\' 5"  (1.651 m) Comment: no shoes  Wt 177 lb (80.3 kg)   SpO2 97%   BMI 29.45 kg/m   Body mass index is 29.45 kg/m.  Advanced Directives 08/21/2018 07/10/2017 06/26/2016 06/26/2016 06/05/2016 05/01/2016  Does Patient Have a Medical Advance Directive? Yes Yes Yes Yes No Yes  Type of Paramedic of Progress;Living will [place];Living will [place];Living will [place];Living will - -  Does patient want to make changes to medical advance directive? - - No - Patient declined No - Patient declined - -  Copy of [place] in Chart? No - copy requested No - copy requested No - copy requested No - copy requested - No - copy requested    Tobacco Social History   Tobacco Use  Smoking Status Former Smoker  . Packs/day: 0.50  . Years: 45.00  . Pack years: 22.50  . Types: Cigarettes  . Last attempt to quit: 01/22/2016  . Years since quitting: 2.5  Smokeless Tobacco Never Used  Tobacco Comment   also uses e-cigaretts on occasion     Counseling given: No Comment: also uses e-cigaretts on occasion   Clinical Intake:  Pre-visit preparation completed: Yes  Pain : No/denies pain Pain Score: 0-No pain     Nutritional Status: BMI 25 -29 Overweight Nutritional Risks: None Diabetes: No  How often do you need to have someone help you when you read instructions, pamphlets, or other written materials from your doctor or pharmacy?: 1 - Never What is the last grade level you  completed in school?: 12th grade + 1 yr college  Interpreter Needed?: No  Comments: pt is a widow and lives alone Information entered by :: LPinson, LPN  Past Medical History:  Diagnosis Date  . Anxiety   . Cancer ([place])    skin  . Depression   . Heart murmur    in her 30's and it is not a problem now  . Hyperlipidemia   . Hyperlipidemia   . Hypertension   . Osteopenia   . Shortness of breath dyspnea    due to excercise only   Past Surgical History:  Procedure Laterality Date  . BREAST BIOPSY Right 01/2002   neg. no lymph node involvement  . 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 CATARACT EXTRACTION W/PHACO Right 05/01/2016   Procedure: CATARACT EXTRACTION PHACO AND INTRAOCULAR LENS PLACEMENT (IOC);  Surgeon: Birder Robson, MD;  Location: [facility] ORS;  Service: Ophthalmology;  Laterality: Right;  [place] 00:53 AP% 21.7 CDE 11.50 fluid pack lot # [id number]  . CATARACT EXTRACTION W/PHACO Left 06/05/2016   Procedure: CATARACT EXTRACTION PHACO AND INTRAOCULAR LENS PLACEMENT ([place]);  Surgeon: Birder Robson, MD;  Location: [facility] ORS;  Service: Ophthalmology;  Laterality: Left;  [place] 01:04 AP% 22.2 CDE 14.34 fluid pack lot # [id number]  . COLONOSCOPY WITH PROPOFOL N/A 11/21/2015   Procedure: COLONOSCOPY WITH PROPOFOL;  Surgeon: Hulen Luster, MD;  Location: [facility] END[facility];  Service: Gastroenterology;  Laterality: N/A;  Family History  Problem Relation Age of Onset  . Lymphoma Father   . Heart disease Father        MI  . Hypertension Father   . Alcohol abuse Brother   . Cancer Maternal Aunt        breast cancer  . Breast cancer Sister   . Breast cancer Daughter    Social History   Socioeconomic History  . Marital status: Widowed    Spouse name: Not on file  . Number of children: Not on file  . Years of education: Not on file  . Highest education level: Not on file  Occupational History  . Not on file  Social Needs  . Financial resource strain: Not on file  . Food  insecurity:    Worry: Not on file    Inability: Not on file  . Transportation needs:    Medical: Not on file    Non-medical: Not on file  Tobacco Use  . Smoking status: Former Smoker    Packs/day: 0.50    Years: 45.00    Pack years: 22.50    Types: Cigarettes    Last attempt to quit: 01/22/2016    Years since quitting: 2.5  . Smokeless tobacco: Never Used  . Tobacco comment: also uses e-cigaretts on occasion  Substance and Sexual Activity  . Alcohol use: No    Alcohol/week: 0.0 standard drinks  . Drug use: No  . Sexual activity: Never  Lifestyle  . Physical activity:    Days per week: Not on file    Minutes per session: Not on file  . Stress: Not on file  Relationships  . Social connections:    Talks on phone: Not on file    Gets together: Not on file    Attends religious service: Not on file    Active member of club or organization: Not on file    Attends meetings of clubs or organizations: Not on file    Relationship status: Not on file  Other Topics Concern  . Not on file  Social History Narrative  . Not on file    Outpatient Encounter Medications as of 08/21/2018  Medication Sig  . aspirin EC 81 MG tablet Take 1 tablet by mouth daily as needed.  . Calcium Carb-Cholecalciferol (CALCIUM 600 + D PO) Take 1 capsule by mouth daily.  . Coconut Oil 1000 MG CAPS Take 1,000 mg by mouth daily.  Marland Kitchen escitalopram (LEXAPRO) 20 MG tablet Take 1 tablet (20 mg total) by mouth daily.  . fluticasone (FLONASE) 50 MCG/ACT nasal spray Place 2 sprays into both nostrils daily.  . hydrochlorothiazide (HYDRODIURIL) 25 MG tablet Take 1 tablet (25 mg total) by mouth daily.  . meloxicam (MOBIC) 15 MG tablet meloxicam 15 mg tablet  . Multiple Vitamin (MULTIVITAMIN WITH MINERALS) TABS tablet Take 1 tablet by mouth daily.  . Omega-3 Fatty Acids (FISH OIL PO) Take 1 tablet by mouth 2 (two) times daily. Rotates with Flaxseed oil tabs  . rosuvastatin (CRESTOR) 5 MG tablet Take 1 tablet (5 mg total) by  mouth every other day.  . vitamin C (ASCORBIC ACID) 500 MG tablet Take 500 mg by mouth daily.  . Vitamin D, Cholecalciferol, 1000 UNITS CAPS Take 1 capsule by mouth daily.  . vitamin E 1000 UNIT capsule Take 1,000 Units by mouth daily.   No facility-administered encounter medications on file as of 08/21/2018.     Activities of Daily Living In your present state of health, do  you have any difficulty performing the following activities: 08/21/2018  Hearing? Y  Comment only in a crowded room  Vision? N  Difficulty concentrating or making decisions? N  Walking or climbing stairs? N  Dressing or bathing? N  Doing errands, shopping? N  Preparing Food and eating ? N  Using the Toilet? N  In the past six months, have you accidently leaked urine? N  Do you have problems with loss of bowel control? N  Managing your Medications? N  Managing your Finances? N  Housekeeping or managing your Housekeeping? N  Some recent data might be hidden    Patient Care Team: Tower, Wynelle Fanny, MD as PCP - General Birder Robson, MD as Referring Physician (Ophthalmology)    Assessment:   This is a routine wellness examination for [place].   Hearing Screening   125Hz  250Hz  500Hz  1000Hz  2000Hz  3000Hz  4000Hz  6000Hz  8000Hz   Right ear:   40 40 40  40    Left ear:   40 0 40  0    Vision Screening Comments: Vision exam in 2019 with Dr. George Ina   Exercise Activities and Dietary recommendations Current Exercise Habits: The patient does not participate in regular exercise at present, Exercise limited by: orthopedic condition(s)  Goals    . Patient Stated     Starting 08/21/2018, I will continue to take medications as prescribed.        Fall Risk Fall Risk  08/21/2018 07/10/2017 06/26/2016 06/26/2016 05/17/2015  Falls in the past year? No No No No No   Depression Screen PHQ 2/9 Scores 08/21/2018 07/10/2017 06/26/2016 06/26/2016  PHQ - 2 Score 0 1 0 0  PHQ- 9 Score 0 - - -     Cognitive Function MMSE - Mini  Mental State Exam 08/21/2018 07/10/2017 06/26/2016  Orientation to time 5 5 5   Orientation to Place 5 5 5   Registration 3 3 3   Attention/ Calculation 0 0 0  Recall 3 2 3   Language- name 2 objects 0 0 0  Language- repeat 1 1 1   Language- follow 3 step command 3 3 3   Language- read & follow direction 0 0 0  Write a sentence 0 0 0  Copy design 0 0 0  Total score 20 19 20      PLEASE NOTE: A Mini-Cog screen was completed. Maximum score is 20. A value of 0 denotes this part of [place] MMSE was not completed or the patient failed this part of the Mini-Cog screening.   Mini-Cog Screening Orientation to Time - Max 5 pts Orientation to Place - Max 5 pts Registration - Max 3 pts Recall - Max 3 pts Language Repeat - Max 1 pts Language Follow 3 Step Command - Max 3 pts     Immunization History  Administered Date(s) Administered  . Influenza Whole 09/16/2008, 11/10/2009, 09/26/2010  . Influenza, High Dose Seasonal PF 09/11/2014, 09/21/2015, 07/23/2016, 08/20/2017  . Influenza-Unspecified 09/16/2013  . Pneumococcal Conjugate-13 05/17/2015  . Pneumococcal Polysaccharide-23 03/24/2004, 11/24/2009, 04/07/2014  . Td 09/09/2006, 09/14/2009  . Zoster 12/06/2010   Screening Tests Health Maintenance  Topic Date Due  . INFLUENZA VACCINE  03/18/2019 (Originally 07/17/2018)  . MAMMOGRAM  01/03/2019  . TETANUS/TDAP  09/15/2019  . COLONOSCOPY  11/20/2025  . DEXA SCAN  Completed  . Hepatitis C Screening  Completed  . PNA vac Low Risk Adult  Completed        Plan:     I have personally reviewed, addressed, and noted the following in  the patient's chart:  A. Medical and social history B. Use of alcohol, tobacco or illicit drugs  C. Current medications and supplements D. Functional ability and status E.  Nutritional status F.  Physical activity G. Advance directives H. List of other physicians I.  Hospitalizations, surgeries, and ER visits in previous 12 months J.  [place] to  include hearing, vision, cognitive, depression L. Referrals and appointments - none  In addition, I have reviewed and discussed with patient certain preventive protocols, quality metrics, and best practice recommendations. A written personalized care plan for preventive services as well as general preventive health recommendations were provided to patient.  See attached scanned questionnaire for additional information.   Signed,   Lindell Noe, MHA, BS, LPN Health Coach

## 2018-08-21 NOTE — Progress Notes (Signed)
PCP notes:   Health maintenance:  Flu vaccine - addressed  Abnormal screenings:   Hearing - failed  Hearing Screening   125Hz  250Hz  500Hz  1000Hz  2000Hz  3000Hz  4000Hz  6000Hz  8000Hz   Right ear:   40 40 40  40    Left ear:   40 0 40  0     Patient concerns:   None  Nurse concerns:  None  Next PCP appt:   08/27/18 @ 0930  I reviewed health advisor's note, was available for consultation, and agree with documentation and plan. Loura Pardon MD

## 2018-08-27 ENCOUNTER — Ambulatory Visit (INDEPENDENT_AMBULATORY_CARE_PROVIDER_SITE_OTHER): Payer: PPO | Admitting: Family Medicine

## 2018-08-27 ENCOUNTER — Encounter: Payer: Self-pay | Admitting: Family Medicine

## 2018-08-27 VITALS — BP 126/78 | HR 70 | Temp 98.1°F | Ht 65.0 in | Wt 178.2 lb

## 2018-08-27 DIAGNOSIS — I10 Essential (primary) hypertension: Secondary | ICD-10-CM | POA: Diagnosis not present

## 2018-08-27 DIAGNOSIS — F419 Anxiety disorder, unspecified: Secondary | ICD-10-CM | POA: Diagnosis not present

## 2018-08-27 DIAGNOSIS — R7303 Prediabetes: Secondary | ICD-10-CM | POA: Diagnosis not present

## 2018-08-27 DIAGNOSIS — M8580 Other specified disorders of bone density and structure, unspecified site: Secondary | ICD-10-CM

## 2018-08-27 DIAGNOSIS — E7800 Pure hypercholesterolemia, unspecified: Secondary | ICD-10-CM | POA: Diagnosis not present

## 2018-08-27 DIAGNOSIS — Z0000 Encounter for general adult medical examination without abnormal findings: Secondary | ICD-10-CM

## 2018-08-27 MED ORDER — HYDROCHLOROTHIAZIDE 25 MG PO TABS: 25.0000 mg | ORAL_TABLET | Freq: Every day | ORAL | 3 refills | Status: DC

## 2018-08-27 MED ORDER — FLUTICASONE PROPIONATE 50 MCG/ACT NA SUSP: 2.0000 | Freq: Every day | NASAL | 3 refills | Status: DC

## 2018-08-27 MED ORDER — ESCITALOPRAM OXALATE 20 MG PO TABS: 20.0000 mg | ORAL_TABLET | Freq: Every day | ORAL | 3 refills | Status: DC

## 2018-08-27 NOTE — Progress Notes (Signed)
Subjective:    Patient ID: Erin Hinton, female    DOB: 1946-02-06, 72 y.o.   MRN: [id number]  HPI Here for health maintenance exam and to review chronic medical problems    Just got off crutches - slight meniscus tear in L knee with severe inflammation  Taking meloxicam- to wean off soon  Ready to get back to normal and start walking again  slt arthritis in knee as well   Caregiver for younger brother - possible ulcerative colitis- weight loss/ weakness/ and also broken leg   Goes out with friends on Friday night   Had amw 9/15 Missed some scattered tones in L ear on hearing screen (she does not notice it much0 Not ready for hearing aide   [facility] - [facility] so she can get her 20% discount   Some allergies in the fall- uses flonase    Mammogram 1/19  Self breast exam - no lumps  Daughter with breast cancer  Also sister   Colonoscopy 12/16 with 10 y recall   dexa 12/11 osteopenia  Takes ca and D Declines another one  No falls or fractures  Is active   zostavax 12/11 Tried to get on wait list at [facility] but it was too long    Wt Readings from Last 3 Encounters:  08/27/18 178 lb 4 oz (80.9 kg)  08/21/18 177 lb (80.3 kg)  07/15/17 178 lb 8 oz (81 kg)  wt is stable Unable to exercise for a while due to knee injury-plans to start back  Eating a healthy diet / makes good choices  (but does eat out)  29.66 kg/m   Pap 08 No gyn problems   Smoking status -quit 2017 occ vapes- and understands risks   bp is stable today  No cp or palpitations or headaches or edema  No side effects to medicines  BP Readings from Last 3 Encounters:  08/27/18 126/78  08/21/18 118/70  07/15/17 132/70     Lab Results  Component Value Date   CREATININE 0.85 08/21/2018   BUN 22 08/21/2018   NA 139 08/21/2018   K 4.5 08/21/2018   CL 102 08/21/2018   CO2 32 08/21/2018    Lab Results  Component Value Date   ALT 12 08/21/2018   AST 16 08/21/2018   ALKPHOS 56  08/21/2018   BILITOT 0.7 08/21/2018    Lab Results  Component Value Date   WBC 6.3 08/21/2018   HGB 13.5 08/21/2018   HCT 39.6 08/21/2018   MCV 97.3 08/21/2018   PLT 247.0 08/21/2018   Lab Results  Component Value Date   TSH 1.32 08/21/2018    Hyperlipidemia Lab Results  Component Value Date   CHOL 230 (H) 08/21/2018   CHOL 185 08/26/2017   CHOL 234 (H) 07/10/2017   Lab Results  Component Value Date   HDL 62.10 08/21/2018   HDL 55.10 08/26/2017   HDL 62.10 07/10/2017   Lab Results  Component Value Date   LDLCALC 148 (H) 08/21/2018   LDLCALC 115 (H) 08/26/2017   LDLCALC 149 (H) 07/10/2017   Lab Results  Component Value Date   TRIG 98.0 08/21/2018   TRIG 74.0 08/26/2017   TRIG 113.0 07/10/2017   Lab Results  Component Value Date   CHOLHDL 4 08/21/2018   CHOLHDL 3 08/26/2017   CHOLHDL 4 07/10/2017   Lab Results  Component Value Date   LDLDIRECT 128.6 11/28/2010   LDLDIRECT 152.1 11/24/2009  LDLDIRECT 138.9 11/23/2008  crestor 5 mg every other day  -no longer takes it  Side effects were too severe  Eating healthy and avoiding fats   Elevated blood glucose 111 fasting Lab Results  Component Value Date   HGBA1C 5.8 08/21/2018    Patient Active Problem List   Diagnosis Date Noted  . Family history of heart disease 07/15/2017  . Prediabetes 07/15/2017  . Anxiety 07/15/2017  . Encounter for Medicare annual wellness exam 04/07/2014  . Colon cancer screening 03/09/2013  . Routine general medical examination at a health care facility 01/30/2012  . HYPERTENSION, BENIGN ESSENTIAL 02/01/2010  . Osteopenia 11/23/2008  . Former smoker 10/21/2007  . Hyperlipidemia 09/29/2007  . MITRAL VALVE PROLAPSE 09/29/2007  . 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 ALLERGY 09/29/2007   Past Medical History:  Diagnosis Date  . Anxiety   . Cancer ([place])    skin  . Depression   . Heart murmur    in her 30's and it is not a problem now  . Hyperlipidemia   .  Hyperlipidemia   . Hypertension   . Osteopenia   . Shortness of breath dyspnea    due to excercise only   Past Surgical History:  Procedure Laterality Date  . BREAST BIOPSY Right 01/2002   neg. no lymph node involvement  . 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 CATARACT EXTRACTION W/PHACO Right 05/01/2016   Procedure: CATARACT EXTRACTION PHACO AND INTRAOCULAR LENS PLACEMENT (IOC);  Surgeon: Birder Robson, MD;  Location: [facility] ORS;  Service: Ophthalmology;  Laterality: Right;  [place] 00:53 AP% 21.7 CDE 11.50 fluid pack lot # [id number]  . CATARACT EXTRACTION W/PHACO Left 06/05/2016   Procedure: CATARACT EXTRACTION PHACO AND INTRAOCULAR LENS PLACEMENT ([place]);  Surgeon: Birder Robson, MD;  Location: [facility] ORS;  Service: Ophthalmology;  Laterality: Left;  [place] 01:04 AP% 22.2 CDE 14.34 fluid pack lot # [id number]  . COLONOSCOPY WITH PROPOFOL N/A 11/21/2015   Procedure: COLONOSCOPY WITH PROPOFOL;  Surgeon: Hulen Luster, MD;  Location[facility] END[facility];  Service: Gastroenterology;  Laterality: N/A;   Social History   Tobacco Use  . Smoking status: Former Smoker    Packs/day: 0.50    Years: 45.00    Pack years: 22.50    Types: Cigarettes    Last attempt to quit: 01/22/2016    Years since quitting: 2.5  . Smokeless tobacco: Never Used  . Tobacco comment: also uses e-cigaretts on occasion  Substance Use Topics  . Alcohol use: No    Alcohol/week: 0.0 standard drinks  . Drug use: No   Family History  Problem Relation Age of Onset  . Lymphoma Father   . Heart disease Father        MI  . Hypertension Father   . Alcohol abuse Brother   . Cancer Maternal Aunt        breast cancer  . Breast cancer Sister   . Breast cancer Daughter    Allergies  Allergen Reactions  . Alendronate Sodium     REACTION: GI  . Atorvastatin     REACTION: Psin  . Crestor [Rosuvastatin Calcium]     Muscle pain  . Evista [Raloxifene] Other (See Comments)    Breast tenderness  . Sertraline Hcl     Did not  help, made anxiety worse  . Pseudoephedrine     REACTION: palpitations   Current Outpatient Medications on File Prior to Visit  Medication Sig Dispense Refill  . aspirin EC  81 MG tablet Take 1 tablet by mouth daily as needed.    . Calcium Carb-Cholecalciferol (CALCIUM 600 + D PO) Take 1 capsule by mouth daily.    . Coconut Oil 1000 MG CAPS Take 1,000 mg by mouth daily.    . meloxicam (MOBIC) 15 MG tablet meloxicam 15 mg tablet    . Multiple Vitamin (MULTIVITAMIN WITH MINERALS) TABS tablet Take 1 tablet by mouth daily.    . Omega-3 Fatty Acids (FISH OIL PO) Take 1 tablet by mouth 2 (two) times daily. Rotates with Flaxseed oil tabs    . vitamin C (ASCORBIC ACID) 500 MG tablet Take 500 mg by mouth daily.    . Vitamin D, Cholecalciferol, 1000 UNITS CAPS Take 1 capsule by mouth daily.    . vitamin E 1000 UNIT capsule Take 1,000 Units by mouth daily.     No current facility-administered medications on file prior to visit.      Review of Systems  Constitutional: Negative for activity change, appetite change, fatigue, fever and unexpected weight change.  HENT: Negative for congestion, ear pain, rhinorrhea, sinus pressure and sore throat.   Eyes: Negative for pain, redness and visual disturbance.  Respiratory: Negative for cough, shortness of breath and wheezing.   Cardiovascular: Negative for chest pain and palpitations.  Gastrointestinal: Negative for abdominal pain, blood in stool, constipation and diarrhea.  Endocrine: Negative for polydipsia and polyuria.  Genitourinary: Negative for dysuria, frequency and urgency.  Musculoskeletal: Positive for arthralgias. Negative for back pain and myalgias.       Knee pain is much improved  Skin: Negative for pallor and rash.       Sees derm frequently for pre cancerous skin lesions  Allergic/Immunologic: Negative for environmental allergies.  Neurological: Negative for dizziness, syncope and headaches.  Hematological: Negative for adenopathy. Does  not bruise/bleed easily.  Psychiatric/Behavioral: Negative for decreased concentration and dysphoric mood. The patient is not nervous/anxious.        Stressors        Objective:   Physical Exam  Constitutional: She appears well-developed and well-nourished. No distress.  overwt and well app  HENT:  Head: Normocephalic and atraumatic.  Right Ear: External ear normal.  Left Ear: External ear normal.  Mouth/Throat: Oropharynx is clear and moist.  Boggy nares  Eyes: Pupils are equal, round, and reactive to light. Conjunctivae and EOM are normal. No scleral icterus.  Neck: Normal range of motion. Neck supple. No JVD present. Carotid bruit is not present. No thyromegaly present.  Cardiovascular: Normal rate, regular rhythm, normal heart sounds and intact distal pulses. Exam reveals no gallop.  Pulmonary/Chest: Effort normal and breath sounds normal. No respiratory distress. She has no wheezes. She exhibits no tenderness. No breast tenderness, discharge or bleeding.  Good air exch No wheeze bs slightly distant  Abdominal: Soft. Bowel sounds are normal. She exhibits no distension, no abdominal bruit and no mass. There is no tenderness.  Genitourinary: No breast tenderness, discharge or bleeding.  Genitourinary Comments: Breast exam: No mass, nodules, thickening, tenderness, bulging, retraction, inflamation, nipple discharge or skin changes noted.  No axillary or clavicular LA.      Musculoskeletal: Normal range of motion. She exhibits no edema or tenderness.  Lymphadenopathy:    She has no cervical adenopathy.  Neurological: She is alert. She has normal reflexes. She displays normal reflexes. No cranial nerve deficit. She exhibits normal muscle tone. Coordination normal.  Skin: Skin is warm and dry. No rash noted. No erythema. No pallor.  Solar aging and lentigines diffusely  Also SKS  Some AKS on back and arms-for derm appt soon  Psychiatric: She has a normal mood and affect.  Pleasant             Assessment & Plan:   Problem List Items Addressed This Visit      Cardiovascular and Mediastinum   HYPERTENSION, BENIGN ESSENTIAL    bp in fair control at this time  BP Readings from Last 1 Encounters:  08/27/18 126/78   No changes needed Most recent labs reviewed  Disc lifstyle change with low sodium diet and exercise        Relevant Medications   hydrochlorothiazide (HYDRODIURIL) 25 MG tablet     Musculoskeletal and Integument   Osteopenia    dexa 2011 Declines another dexa or tx  On ca and D Exercising No falls or fx  Enc her to let us know if she changes her mind- high risk for OP due to long hx of smoking in the past        Other   Anxiety    Now care giving for brother lexapro continues to help      Relevant Medications   escitalopram (LEXAPRO) 20 MG tablet   Hyperlipidemia    Disc goals for lipids and reasons to control them Rev last labs with pt Rev low sat fat diet in detail LDL is up  Intol to statins-could not tolerate crestor even low dose QOD unfortunately  Will continue to watch diet       Relevant Medications   hydrochlorothiazide (HYDRODIURIL) 25 MG tablet   Prediabetes    Lab Results  Component Value Date   HGBA1C 5.8 08/21/2018   disc imp of low glycemic diet and wt loss to prevent DM2       Routine general medical examination at a health care facility - Primary    Reviewed health habits including diet and exercise and skin cancer prevention Reviewed appropriate screening tests for age  Also reviewed health mt list, fam hx and immunization status , as well as social and family history   See HPI amw rev Labs rev  Disc imp of good diet for cholesterol/ prediabetes Exercise- plans to start back with knee healing Declines dexa or OP tx  [facility]

## 2018-08-27 NOTE — Assessment & Plan Note (Signed)
Now care giving for brother lexapro continues to help

## 2018-08-27 NOTE — Assessment & Plan Note (Signed)
bp in fair control at this time  BP Readings from Last 1 Encounters:  08/27/18 126/78   No changes needed Most recent labs reviewed  Disc lifstyle change with low sodium diet and exercise

## 2018-08-27 NOTE — Assessment & Plan Note (Signed)
Lab Results  Component Value Date   HGBA1C 5.8 08/21/2018   disc imp of low glycemic diet and wt loss to prevent DM2

## 2018-08-27 NOTE — Assessment & Plan Note (Signed)
dexa 2011 Declines another dexa or tx  On ca and D Exercising No falls or fx  Enc her to let us know if she changes her mind- high risk for OP due to long hx of smoking in the past

## 2018-08-27 NOTE — Assessment & Plan Note (Signed)
Disc goals for lipids and reasons to control them Rev last labs with pt Rev low sat fat diet in detail LDL is up  Intol to statins-could not tolerate crestor even low dose QOD unfortunately  Will continue to watch diet

## 2018-08-27 NOTE — Patient Instructions (Addendum)
If you are interested in the new shingles vaccine (Shingrix) - call your local pharmacy to check on coverage and availability  If affordable- then get on a wait list   To prevent diabetes Try to get most of your carbohydrates from produce (with the exception of white potatoes)  Eat less bread/pasta/rice/snack foods/cereals/sweets and other items from the middle of the grocery store (processed carbs)  Get back to exercise when you can   For cholesterol   Avoid red meat/ fried foods/ egg yolks/ fatty breakfast meats/ butter, cheese and high fat dairy/ and shellfish    [facility]

## 2018-08-27 NOTE — Assessment & Plan Note (Signed)
Reviewed health habits including diet and exercise and skin cancer prevention Reviewed appropriate screening tests for age  Also reviewed health mt list, fam hx and immunization status , as well as social and family history   See HPI amw rev Labs rev  Disc imp of good diet for cholesterol/ prediabetes Exercise- plans to start back with knee healing Declines dexa or OP tx  [facility]

## 2018-10-09 DIAGNOSIS — D0462 Carcinoma in situ of skin of left upper limb, including shoulder: Secondary | ICD-10-CM | POA: Diagnosis not present

## 2018-12-16 ENCOUNTER — Other Ambulatory Visit: Payer: Self-pay | Admitting: Family Medicine

## 2018-12-16 DIAGNOSIS — Z1231 Encounter for screening mammogram for malignant neoplasm of breast: Secondary | ICD-10-CM

## 2019-01-07 ENCOUNTER — Ambulatory Visit
Admission: RE | Admit: 2019-01-07 | Discharge: 2019-01-07 | Disposition: A | Discharge status: Home / Self Care | Payer: PPO | Source: Ambulatory Visit | Attending: Family Medicine | Admitting: Family Medicine

## 2019-01-07 DIAGNOSIS — Z1231 Encounter for screening mammogram for malignant neoplasm of breast: Secondary | ICD-10-CM | POA: Insufficient documentation

## 2019-01-08 ENCOUNTER — Other Ambulatory Visit: Payer: Self-pay | Admitting: Family Medicine

## 2019-01-08 DIAGNOSIS — R921 Mammographic calcification found on diagnostic imaging of breast: Secondary | ICD-10-CM

## 2019-01-08 DIAGNOSIS — R928 Other abnormal and inconclusive findings on diagnostic imaging of breast: Secondary | ICD-10-CM

## 2019-01-16 ENCOUNTER — Ambulatory Visit
Admission: RE | Admit: 2019-01-16 | Discharge: 2019-01-16 | Disposition: A | Discharge status: Home / Self Care | Payer: PPO | Source: Ambulatory Visit | Attending: Family Medicine | Admitting: Family Medicine

## 2019-01-16 DIAGNOSIS — R928 Other abnormal and inconclusive findings on diagnostic imaging of breast: Secondary | ICD-10-CM | POA: Diagnosis not present

## 2019-01-16 DIAGNOSIS — R921 Mammographic calcification found on diagnostic imaging of breast: Secondary | ICD-10-CM | POA: Insufficient documentation

## 2019-01-16 DIAGNOSIS — R922 Inconclusive mammogram: Secondary | ICD-10-CM | POA: Diagnosis not present

## 2019-01-19 ENCOUNTER — Other Ambulatory Visit: Payer: Self-pay | Admitting: Family Medicine

## 2019-01-19 DIAGNOSIS — R921 Mammographic calcification found on diagnostic imaging of breast: Secondary | ICD-10-CM

## 2019-01-19 DIAGNOSIS — R928 Other abnormal and inconclusive findings on diagnostic imaging of breast: Secondary | ICD-10-CM

## 2019-01-27 ENCOUNTER — Ambulatory Visit
Admission: RE | Admit: 2019-01-27 | Discharge: 2019-01-27 | Disposition: A | Discharge status: Home / Self Care | Payer: PPO | Source: Ambulatory Visit | Attending: Family Medicine | Admitting: Family Medicine

## 2019-01-27 DIAGNOSIS — R921 Mammographic calcification found on diagnostic imaging of breast: Secondary | ICD-10-CM

## 2019-01-27 DIAGNOSIS — R928 Other abnormal and inconclusive findings on diagnostic imaging of breast: Secondary | ICD-10-CM

## 2019-01-27 DIAGNOSIS — D0512 Intraductal carcinoma in situ of left breast: Secondary | ICD-10-CM | POA: Diagnosis not present

## 2019-01-27 HISTORY — PX: BREAST BIOPSY: SHX20

## 2019-01-30 ENCOUNTER — Other Ambulatory Visit: Payer: Self-pay | Admitting: Anatomic Pathology & Clinical Pathology

## 2019-01-30 LAB — SURGICAL PATHOLOGY

## 2019-01-30 NOTE — Progress Notes (Signed)
  Oncology Nurse Navigator Documentation  Navigator Location: [facility] (01/30/19 1600)   )Navigator Encounter Type: Introductory phone call (01/30/19 1600)                     Patient Visit Type: Initial (01/30/19 1600)   Barriers/Navigation Needs: Coordination of Care (01/30/19 1600)   Interventions: Education;Coordination of Care;Psycho-social support (01/30/19 1600)                      Time Spent with Patient: 30 (01/30/19 1600)   Introduced navigation.  Patient request appointment with Dr. Bary Castilla. Office closed. Will schedule Monday.  States she had BRCA testing in 1996 and it was negative.  Significant family history of cancer.

## 2019-02-02 NOTE — Progress Notes (Signed)
  Oncology Nurse Navigator Documentation  Navigator Location: [facility] (02/02/19 1000)   )Navigator Encounter Type: Telephone (02/02/19 1000) Telephone: Appt Confirmation/Clarification (02/02/19 1000)                   Patient Visit Type: Initial (02/02/19 1000)   Barriers/Navigation Needs: Coordination of Care (02/02/19 1000)   Interventions: Coordination of Care (02/02/19 1000)   Coordination of Care: Appts (02/02/19 1000)                  Time Spent with Patient: 30 (02/02/19 1000)   Patient notified of Consult with Dr. Bary Castilla on 02/03/2019 at 11:15.  Breast Cancer Treatment Handbook/Folder with hospital services taken to [facility], to be given to patient at consult.

## 2019-02-03 ENCOUNTER — Encounter: Payer: Self-pay | Admitting: General Surgery

## 2019-02-03 ENCOUNTER — Ambulatory Visit (INDEPENDENT_AMBULATORY_CARE_PROVIDER_SITE_OTHER): Payer: PPO

## 2019-02-03 ENCOUNTER — Ambulatory Visit (INDEPENDENT_AMBULATORY_CARE_PROVIDER_SITE_OTHER): Payer: PPO | Admitting: General Surgery

## 2019-02-03 ENCOUNTER — Telehealth: Payer: Self-pay

## 2019-02-03 ENCOUNTER — Other Ambulatory Visit: Payer: Self-pay

## 2019-02-03 VITALS — BP 174/106 | HR 74 | Temp 97.3°F | Resp 14 | Ht 65.5 in | Wt 182.0 lb

## 2019-02-03 DIAGNOSIS — D0512 Intraductal carcinoma in situ of left breast: Secondary | ICD-10-CM | POA: Diagnosis not present

## 2019-02-03 NOTE — Telephone Encounter (Signed)
Patient notified of date and time.  

## 2019-02-03 NOTE — Progress Notes (Signed)
Patient ID: Erin Hinton, female   DOB: [birth year], 73 y.o.   MRN: [id number]  Chief Complaint  Patient presents with  . Breast Problem    HPI Erin Hinton is a 73 y.o. female.  who presents for a breast evaluation. The most recent mammogram and left breast biopsy was done on 01-27-19, showing DCIS.  Patient does perform regular self breast checks and gets regular mammograms done.   She could not feel anything different in the breast prior to mammogram.  HPI  Past Medical History:  Diagnosis Date  . Anxiety   . Breast cancer [facility]) 1996   right breast, Dr Faylene Million, DCIS, lumpectomy only  . Cancer ([place])    skin  . Depression   . Heart murmur    in her 30's and it is not a problem now  . Hyperlipidemia   . Hyperlipidemia   . Hypertension   . Osteopenia   . Shortness of breath dyspnea    due to excercise only    Past Surgical History:  Procedure Laterality Date  . BREAST BIOPSY Left 01/27/2019   Affirm Bx- Coil Clip- HIGH-GRADE DUCTAL CARCINOMA IN SITU, WITH COMEDONECROSIS.  Marland Kitchen BREAST EXCISIONAL BIOPSY Right 01/2002   neg. no lymph node involvement  . 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 CATARACT EXTRACTION W/PHACO Right 05/01/2016   Procedure: CATARACT EXTRACTION PHACO AND INTRAOCULAR LENS PLACEMENT (IOC);  Surgeon: Birder Robson, MD;  Location: [facility] ORS;  Service: Ophthalmology;  Laterality: Right;  [place] 00:53 AP% 21.7 CDE 11.50 fluid pack lot # [id number]  . CATARACT EXTRACTION W/PHACO Left 06/05/2016   Procedure: CATARACT EXTRACTION PHACO AND INTRAOCULAR LENS PLACEMENT ([place]);  Surgeon: Birder Robson, MD;  Location: [facility] ORS;  Service: Ophthalmology;  Laterality: Left;  [place] 01:04 AP% 22.2 CDE 14.34 fluid pack lot # [id number]  . COLONOSCOPY WITH PROPOFOL N/A 11/21/2015   Procedure: COLONOSCOPY WITH PROPOFOL;  Surgeon: Hulen Luster, MD;  [facility] END[facility]COPY;  Service: Gastroenterology;  Laterality: N/A;    Family History  Problem Relation Age of Onset   . Lymphoma Father   . Heart disease Father        MI  . Hypertension Father   . Alcohol abuse Brother   . Breast cancer Sister 58  . Breast cancer Daughter [address]       Theophilus Kinds, LCIS  . Breast cancer Paternal Aunt 4  . Colon cancer Neg Hx     Social History Social History   Tobacco Use  . Smoking status: Former Smoker    Packs/day: 0.50    Years: 45.00    Pack years: 22.50    Types: Cigarettes, E-cigarettes    Last attempt to quit: 01/22/2016    Years since quitting: 3.0  . Smokeless tobacco: Never Used  . Tobacco comment: also uses e-cigaretts on occasion  Substance Use Topics  . Alcohol use: No    Alcohol/week: 0.0 standard drinks  . Drug use: No    Allergies  Allergen Reactions  . Alendronate Sodium     REACTION: GI  . Atorvastatin     REACTION: Psin  . Crestor [Rosuvastatin Calcium]     Muscle pain  . Evista [Raloxifene] Other (See Comments)    Breast tenderness  . Sertraline Hcl     Did not help, made anxiety worse  . Pseudoephedrine     REACTION: palpitations    Current Outpatient Medications  Medication Sig Dispense Refill  . Calcium Carb-Cholecalciferol (CALCIUM  600 + D PO) Take 1 capsule by mouth daily.    . Coconut Oil 1000 MG CAPS Take 1,000 mg by mouth daily.    Marland Kitchen escitalopram (LEXAPRO) 20 MG tablet Take 1 tablet (20 mg total) by mouth daily. 90 tablet 3  . fluticasone (FLONASE) 50 MCG/ACT nasal spray Place 2 sprays into both nostrils daily. 48 g 3  . hydrochlorothiazide (HYDRODIURIL) 25 MG tablet Take 1 tablet (25 mg total) by mouth daily. 90 tablet 3  . Multiple Vitamin (MULTIVITAMIN WITH MINERALS) TABS tablet Take 1 tablet by mouth daily.    . Omega-3 Fatty Acids (FISH OIL PO) Take 1 tablet by mouth 2 (two) times daily. Rotates with Flaxseed oil tabs    . vitamin C (ASCORBIC ACID) 500 MG tablet Take 500 mg by mouth daily.    . Vitamin D, Cholecalciferol, 1000 UNITS CAPS Take 1 capsule by mouth daily.    . vitamin E 1000 UNIT capsule Take 1,000  Units by mouth daily.     No current facility-administered medications for this visit.     Review of Systems Review of Systems  Constitutional: Negative.   Respiratory: Negative.   Cardiovascular: Negative.     Blood pressure (!) 174/106, pulse 74, temperature (!) 97.3 F (36.3 C), temperature source Temporal, resp. rate 14, height 5' 5.5" (1.664 m), weight 182 lb (82.6 kg), SpO2 97 %.  Physical Exam Physical Exam Exam conducted with a chaperone present.  Constitutional:      Appearance: She is well-developed.  Eyes:     General: No scleral icterus.    Conjunctiva/sclera: Conjunctivae normal.  Neck:     Musculoskeletal: Neck supple.  Cardiovascular:     Rate and Rhythm: Normal rate and regular rhythm.     Heart sounds: Normal heart sounds.  Pulmonary:     Effort: Pulmonary effort is normal.     Breath sounds: Normal breath sounds.  Chest:     Breasts:        Right: No inverted nipple, mass, nipple discharge, skin change or tenderness.        Left: No inverted nipple, mass, nipple discharge, skin change or tenderness.    Lymphadenopathy:     Cervical: No cervical adenopathy.  Skin:    General: Skin is warm and dry.  Neurological:     Mental Status: She is alert and oriented to person, place, and time.  Psychiatric:        Behavior: Behavior normal.     Data Reviewed Left breast stereotactic biopsy dated January 27, 2019: A. BREAST, LEFT LOWER OUTER QUADRANT; STEREOTACTIC CORE BIOPSY:  - HIGH-GRADE DUCTAL CARCINOMA IN SITU, WITH COMEDONECROSIS.  - CALCIFICATIONS ASSOCIATED WITH NEOPLASTIC BREAST DISEASE.  - NO EVIDENCE OF INVASIVE CARCINOMA.   2019-2020 mammograms were reviewed.  Ultrasound examination of the left breast was undertaken to determine if preoperative wire localization would be required.  There is no hypoechoic area with evidence of a biopsy clip at the 9 o'clock position immediately adjacent to the base of the nipple at its interface with the  areola.  This is 1.7 cm below the skin surface corresponding with the post biopsy mammograms.  BI-RADS-6.  Assessment    High-grade DCIS of the left breast.    Plan    The patient did very well with breast conservation in 1996 and is interested in proceeding along the same pathway at this time.  She required no radiation at that juncture, and she was advised that there is been  a change in philosophy and that likely post wide excision radiation will be recommended.  Opportunity for second surgical opinion was offered and declined.  The majority of today's visit was spent reviewing options for management.     HPI, assessment, plan and physical exam has been scribed under the direction and in the presence of Robert Bellow, MD. Karie Fetch, RN  I have completed the exam and reviewed the above documentation for accuracy and completeness.  I agree with the above.  Haematologist has been used and any errors in dictation or transcription are unintentional.  Hervey Ard, M.D., F.A.C.S.   Forest Gleason Horris Speros 02/03/2019, 3:19 PM

## 2019-02-03 NOTE — Patient Instructions (Addendum)
The patient is aware to call back for any questions or new concerns.  The patient is scheduled for surgery [facility] [place] with Dr Bary Castilla on 02/11/19. She will pre admit at the hospital and we will call her with that appointment. The patient is aware of date and instructions.

## 2019-02-03 NOTE — Telephone Encounter (Signed)
The patient is scheduled for Pre admit testing at the [facility] on 02/09/19 at 11:45 am. I have left her a message to call back to get this information.

## 2019-02-05 ENCOUNTER — Other Ambulatory Visit: Payer: Self-pay

## 2019-02-09 ENCOUNTER — Telehealth: Payer: Self-pay | Admitting: *Deleted

## 2019-02-09 ENCOUNTER — Other Ambulatory Visit: Payer: Self-pay

## 2019-02-09 ENCOUNTER — Encounter: Payer: Self-pay | Admitting: Anesthesiology

## 2019-02-09 ENCOUNTER — Encounter
Admission: RE | Admit: 2019-02-09 | Discharge: 2019-02-09 | Disposition: A | Discharge status: Home / Self Care | Payer: PPO | Source: Ambulatory Visit | Attending: General Surgery | Admitting: General Surgery

## 2019-02-09 ENCOUNTER — Telehealth: Payer: Self-pay

## 2019-02-09 DIAGNOSIS — Z01818 Encounter for other preprocedural examination: Secondary | ICD-10-CM | POA: Diagnosis not present

## 2019-02-09 DIAGNOSIS — I10 Essential (primary) hypertension: Secondary | ICD-10-CM | POA: Insufficient documentation

## 2019-02-09 NOTE — Telephone Encounter (Signed)
Erin Hinton from [facility] office called and stated that the patient is scheduled to see Dr.Tower tomorrow 02/10/19 at 4:15pm. They are aware to call if anything is abnormal.

## 2019-02-09 NOTE — Telephone Encounter (Signed)
Sharyn Lull with [facility], Dr Willette Cluster office said that pt had pre op visit today and EKG was abnormal and wants Dr Glori Bickers to review todays EKG under ECG tab. Does pt need to be seen by Dr Glori Bickers. pts has Breast CA and surgery is scheduled for 02/11/19. [facility] request cb ASAP. Pt last seen by Dr Glori Bickers on 08/27/18. I spoke with Dr Glori Bickers and she said she thinks should be fine but schedule pt 02/10/19 at 4:15 to discuss and review with pt. Pt notified and voiced understanding and will see Dr Glori Bickers tomorrow at 4 :84. Raquel Sarna at Dr Willette Cluster office notified as well(Michelle was not available but Raquel Sarna will get info to her and voiced understanding. FYI to Dr Glori Bickers.Raquel Sarna said if there are abnormal findings with Dr Alba Cory visit with pt to call [facility] to notify.  FYI to [place] CMA also.

## 2019-02-09 NOTE — Telephone Encounter (Signed)
Appointment with Dr. Glori Bickers on 02-10-19 at 4:15 pm per Raquel Sarna.

## 2019-02-09 NOTE — Telephone Encounter (Signed)
Per Cherie with Anesthesia, they would like for PCP to review EKG.   She has notified the patient she may need to be seen.   Patient sees Dr. Loura Pardon.   Surgery scheduled for 02-11-19.  Note routed to Dr. Bary Castilla.

## 2019-02-09 NOTE — Telephone Encounter (Signed)
Patient aware of appointment on 02-10-19 at 4:15 pm. I called to confirm Dr. Marliss Coots office had contacted her directly.

## 2019-02-09 NOTE — Patient Instructions (Addendum)
  Your procedure is scheduled on: Wednesday February 11, 2019 Report to Same Day Surgery 2nd floor [facility] Entrance-take elevator on left to 2nd floor.  Check in with surgery information desk.) To find out your arrival time, call [phone] 1:00-3:00 PM on Tuesday February 10, 2019  Remember: Instructions that are not followed completely may result in serious medical risk, up to and including death, or upon the discretion of your surgeon and anesthesiologist your surgery may need to be rescheduled.    __x__ 1. Do not eat food (including mints, candies, chewing gum) after midnight the night before your procedure. You may drink clear liquids up to 2 hours before you are scheduled to arrive at the hospital for your procedure.  Do not drink anything within 2 hours of your scheduled arrival to the hospital.  Approved clear liquids:  --Water or Apple juice without pulp  --Clear carbohydrate beverage such as Gatorade or Powerade  --Black Coffee or Clear Tea (No milk, no creamers, do not add anything to the coffee or tea)    __x__ 2. No Alcohol for 24 hours before or after surgery.   __x__ 3. No Smoking or e-cigarettes for 24 hours before surgery.  Do not use any chewable tobacco products for at least 6 hours before surgery.   __x__ 4. Notify your doctor if there is any change in your medical condition (cold, fever, infections).   __x__ 5. On the morning of surgery brush your teeth with toothpaste and water.  You may rinse your mouth with mouthwash if you wish.  Do not swallow any toothpaste or mouthwash.  Please read over the following fact sheets that you were given:   [facility] Preparing for Surgery and/or MRSA Information    __x__ Use CHG Soap as directed on instruction sheet.   Do not wear jewelry, make-up, hairpins, clips or nail polish on the day of surgery.  Do not wear lotions, powders, deodorant, or perfumes.   Do not shave below the face/neck 48 hours prior to  surgery.   Do not bring valuables to the hospital.    [facility] is not responsible for any belongings or valuables.               Eyeglasses may not be worn into surgery.  For patients discharged on the day of surgery, you will NOT be permitted to drive yourself home.  You must have a responsible adult with you for 24 hours after surgery.  __x__ Take these medicines on the morning of surgery with a SMALL SIP OF WATER:  1. Escitalopram (Lexapro)   2. Fluticasone (Flonase) if needed  Do not take your Hydrochlorothiazide (Hydrodiuril) on the morning of surgery  ____ Follow recommendations from Cardiologist, Pulmonologist or PCP regarding stopping any blood thinners such as Aspirin, Coumadin, Plavix, Eliquis, Effient, Pradaxa, and Pletal.  ____  Stop Anti-inflammatories such as Advil, Ibuprofen, Motrin, Aleve, Naproxen, Naprosyn, BC/Goodies powders or aspirin products. You may continue to take Tylenol and Celebrex.   __x__ Stop over the counter supplements until after surgery.

## 2019-02-09 NOTE — Telephone Encounter (Signed)
Caryl-Lyn, will you please follow up on this on Tuesday, 02-10-19? I left a message for Cherie at Pre-admit letting her know the details. Thanks.

## 2019-02-09 NOTE — Pre-Procedure Instructions (Addendum)
EKG/REQUEST FOR CLEARANCE CALLED AND FAXED TO DR Thea Alken. ALSO CALLED AND FAXED TO DR Bary Castilla. ALSO NOTIFIED PATIENT

## 2019-02-09 NOTE — Telephone Encounter (Signed)
I did call and speak with Rena at Dr. Marliss Coots office in regards to clearance.   She will check with Dr. Marliss Coots assistant to see she can go ahead and review 02-09-19 EKG to see if she can clear patient based off of her last office visit which was in 08-2018 or if patient will need to be seen in the office.

## 2019-02-09 NOTE — Telephone Encounter (Signed)
I will see her then  

## 2019-02-10 ENCOUNTER — Telehealth: Payer: Self-pay | Admitting: *Deleted

## 2019-02-10 ENCOUNTER — Ambulatory Visit (INDEPENDENT_AMBULATORY_CARE_PROVIDER_SITE_OTHER): Payer: PPO | Admitting: Family Medicine

## 2019-02-10 ENCOUNTER — Encounter: Payer: Self-pay | Admitting: Family Medicine

## 2019-02-10 VITALS — BP 130/72 | HR 93 | Temp 98.2°F | Ht 65.0 in | Wt 179.4 lb

## 2019-02-10 DIAGNOSIS — Z01818 Encounter for other preprocedural examination: Secondary | ICD-10-CM | POA: Insufficient documentation

## 2019-02-10 DIAGNOSIS — R7303 Prediabetes: Secondary | ICD-10-CM | POA: Diagnosis not present

## 2019-02-10 DIAGNOSIS — Z01810 Encounter for preprocedural cardiovascular examination: Secondary | ICD-10-CM

## 2019-02-10 DIAGNOSIS — R9431 Abnormal electrocardiogram [ECG] [EKG]: Secondary | ICD-10-CM | POA: Diagnosis not present

## 2019-02-10 DIAGNOSIS — E7800 Pure hypercholesterolemia, unspecified: Secondary | ICD-10-CM | POA: Diagnosis not present

## 2019-02-10 DIAGNOSIS — I10 Essential (primary) hypertension: Secondary | ICD-10-CM | POA: Diagnosis not present

## 2019-02-10 DIAGNOSIS — D0512 Intraductal carcinoma in situ of left breast: Secondary | ICD-10-CM | POA: Diagnosis not present

## 2019-02-10 NOTE — Patient Instructions (Signed)
We need to get you cleared by cardiology before your surgery   If you develop chest pain or shortness of breath get to the ER

## 2019-02-10 NOTE — Assessment & Plan Note (Signed)
Planning L breast wide excision but needs cardiac clearance first  Ref made  Will have to re schedule tomorrow's surgery due to abn EKG

## 2019-02-10 NOTE — Assessment & Plan Note (Signed)
Sent over for abnormal EKG from Dr Dwyane Luo office  ST depression in leads II, III Avf and V5,6  No hx of MI  Multiple cardiac risk factors incl prior smoking/fam hx and elevated lipids (intol to statin)  Will ref to cardiology for clearance before she can have her breast surgery

## 2019-02-10 NOTE — Telephone Encounter (Signed)
Called [facility] and advised them pt isn't cleared for surgery until she is seen and cleared by cardiology, Dr. Glori Bickers put an urgent referral into cards and surgical center will take care of cancelling OR an notifying hospital

## 2019-02-10 NOTE — Progress Notes (Signed)
Subjective:    Patient ID: Erin Hinton, female    DOB: January 15, 1946, 73 y.o.   MRN: [id number]  HPI Here for pre op clearance for L breast wide excision with Dr. Bary Castilla planned tomorrow   Having a stressful day Her brother was just diagnosed with a stroke (with pneumonia in hospital) Palliative care     Wt Readings from Last 3 Encounters:  02/10/19 179 lb 6 oz (81.4 kg)  02/09/19 179 lb 6.4 oz (81.4 kg)  02/03/19 182 lb (82.6 kg)   29.85 kg/m   Pulse Readings from Last 3 Encounters:  02/10/19 93  02/09/19 85  02/03/19 74   BP Readings from Last 3 Encounters:  02/10/19 130/72  02/09/19 (!) 145/69  02/03/19 (!) 174/106   Former smoker  Lab Results  Component Value Date   CHOL 230 (H) 08/21/2018   HDL 62.10 08/21/2018   LDLCALC 148 (H) 08/21/2018   LDLDIRECT 128.6 11/28/2010   TRIG 98.0 08/21/2018   CHOLHDL 4 08/21/2018  cannot tolerate statins at all   Prediabetes Lab Results  Component Value Date   HGBA1C 5.8 08/21/2018    EKG today confirms some mild ST depression in 2,3, avf, V5,6  Was walking well over the summer  No cp or sob  With mowing does get a little sob  No swelling in ankles   No problems with anesthesia  No hx of anaphylaxis   Patient Active Problem List   Diagnosis Date Noted  . Abnormal EKG 02/10/2019  . Pre-operative clearance 02/10/2019  . 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  . Family history of heart disease 07/15/2017  . Prediabetes 07/15/2017  . Anxiety 07/15/2017  . Encounter for Medicare annual wellness exam 04/07/2014  . Colon cancer screening 03/09/2013  . Routine general medical examination at a health care facility 01/30/2012  . HYPERTENSION, BENIGN ESSENTIAL 02/01/2010  . Osteopenia 11/23/2008  . Former smoker 10/21/2007  . Hyperlipidemia 09/29/2007  . MITRAL VALVE PROLAPSE 09/29/2007  . 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 ALLERGY 09/29/2007   Past Medical History:  Diagnosis  Date  . Anxiety   . Breast cancer [facility]) 1996   right breast, Dr Faylene Million, DCIS, lumpectomy only  . Cancer ([place])    skin  . Depression   . Heart murmur    in her 30's and it is not a problem now  . Hyperlipidemia   . Hyperlipidemia   . Hypertension   . Osteopenia   . Shortness of breath dyspnea    due to excercise only   Past Surgical History:  Procedure Laterality Date  . BREAST BIOPSY Left 01/27/2019   Affirm Bx- Coil Clip- HIGH-GRADE DUCTAL CARCINOMA IN SITU, WITH COMEDONECROSIS.  Marland Kitchen BREAST EXCISIONAL BIOPSY Right 01/2002   neg. no lymph node involvement  . 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 CATARACT EXTRACTION W/PHACO Right 05/01/2016   Procedure: CATARACT EXTRACTION PHACO AND INTRAOCULAR LENS PLACEMENT (I[facility]);  Surgeon: Birder Robson, MD;  Location: [facility] ORS;  Service: Ophthalmology;  Laterality: Right;  [place] 00:53 AP% 21.7 CDE 11.50 fluid pack lot # [id number]  . CATARACT EXTRACTION W/PHACO Left 06/05/2016   Procedure: CATARACT EXTRACTION PHACO AND INTRAOCULAR LENS PLACEMENT ([place]);  Surgeon: Birder Robson, MD;  Location: [facility];  Service: Ophthalmology;  Laterality: Left;  [place] 01:04 AP% 22.2 CDE 14.34 fluid pack lot # [id number]  . COLONOSCOPY WITH PROPOFOL N/A 11/21/2015   Procedure: COLONOSCOPY WITH PROPOFOL;  Surgeon: Lupita Dawn  Candace Cruise, MD;  Location: [facility];  Service: Gastroenterology;  Laterality: N/A;   Social History   Tobacco Use  . Smoking status: Former Smoker    Packs/day: 0.50    Years: 45.00    Pack years: 22.50    Types: Cigarettes, E-cigarettes    Last attempt to quit: 01/22/2016    Years since quitting: 3.0  . Smokeless tobacco: Never Used  . Tobacco comment: also uses e-cigaretts on occasion  Substance Use Topics  . Alcohol use: No    Alcohol/week: 0.0 standard drinks  . Drug use: No   Family History  Problem Relation Age of Onset  . Lymphoma Father   . Heart disease Father        MI  . Hypertension Father   . Alcohol abuse Brother     . Breast cancer Sister 29  . Breast cancer Daughter [address]       Theophilus Kinds, LCIS  . Breast cancer Paternal Aunt 22  . Colon cancer Neg Hx    Allergies  Allergen Reactions  . Alendronate Sodium     GI pain  . Atorvastatin     Muscle pain  . Crestor [Rosuvastatin Calcium]     Muscle pain  . Evista [Raloxifene] Other (See Comments)    Breast tenderness  . Sertraline Hcl     Did not help, made anxiety worse  . Pseudoephedrine Palpitations   Current Outpatient Medications on File Prior to Visit  Medication Sig Dispense Refill  . acetaminophen (TYLENOL) 500 MG tablet Take 500 mg by mouth every 6 (six) hours as needed (for pain.).    Marland Kitchen Ascorbic Acid (VITAMIN C WITH ROSE HIPS) 1000 MG tablet Take 1,000 mg by mouth daily.    . Calcium Carb-Cholecalciferol (CALCIUM 600 + D PO) Take 1 capsule by mouth daily.    . Calcium-Magnesium-Zinc (CAL-MAG-ZINC PO) Take 1 tablet by mouth daily.    . Cholecalciferol (VITAMIN D) 50 MCG (2000 UT) tablet Take 2,000 Units by mouth daily.    . Coconut Oil 1000 MG CAPS Take 1,000 mg by mouth daily.    Marland Kitchen escitalopram (LEXAPRO) 20 MG tablet Take 1 tablet (20 mg total) by mouth daily. 90 tablet 3  . fluticasone (FLONASE) 50 MCG/ACT nasal spray Place 2 sprays into both nostrils daily. (Patient taking differently: Place 2 sprays into both nostrils daily as needed for allergies. ) 48 g 3  . hydrochlorothiazide (HYDRODIURIL) 25 MG tablet Take 1 tablet (25 mg total) by mouth daily. 90 tablet 3  . Omega-3 Fatty Acids (FISH OIL) 1000 MG CAPS Take 1,000 mg by mouth daily.     No current facility-administered medications on file prior to visit.       Review of Systems  Constitutional: Negative for activity change, appetite change, fatigue, fever and unexpected weight change.  HENT: Negative for congestion, ear pain, rhinorrhea, sinus pressure and sore throat.   Eyes: Negative for pain, redness and visual disturbance.  Respiratory: Negative for cough, shortness of  breath and wheezing.        Occ sob on exertion  Not all the time   Cardiovascular: Negative for chest pain, palpitations and leg swelling.  Gastrointestinal: Negative for abdominal pain, blood in stool, constipation and diarrhea.  Endocrine: Negative for polydipsia and polyuria.  Genitourinary: Negative for dysuria, frequency and urgency.  Musculoskeletal: Negative for arthralgias, back pain and myalgias.  Skin: Negative for pallor and rash.  Allergic/Immunologic: Negative for environmental allergies.  Neurological: Negative for dizziness, syncope  and headaches.  Hematological: Negative for adenopathy. Does not bruise/bleed easily.  Psychiatric/Behavioral: Negative for decreased concentration and dysphoric mood. The patient is nervous/anxious.        Stressors  Some anxiety       Objective:   Physical Exam Constitutional:      General: She is not in acute distress.    Appearance: Normal appearance. She is well-developed and normal weight. She is not ill-appearing or diaphoretic.  HENT:     Head: Normocephalic and atraumatic.     Right Ear: External ear normal.     Left Ear: External ear normal.     Mouth/Throat:     Mouth: Mucous membranes are moist.     Pharynx: Oropharynx is clear.  Eyes:     General: No scleral icterus.    Conjunctiva/sclera: Conjunctivae normal.     Pupils: Pupils are equal, round, and reactive to light.  Neck:     Musculoskeletal: Normal range of motion and neck supple.     Thyroid: No thyromegaly.     Vascular: No carotid bruit or JVD.     Comments: ? R carotid bruit  Cardiovascular:     Rate and Rhythm: Regular rhythm. Tachycardia present.     Heart sounds: Normal heart sounds. No gallop.   Pulmonary:     Effort: Pulmonary effort is normal. No respiratory distress.     Breath sounds: Normal breath sounds. No stridor. No wheezing, rhonchi or rales.     Comments: Diffusely distant bs  No wheeze  No crackles  Chest:     Chest wall: No tenderness.   Abdominal:     General: Bowel sounds are normal. There is no distension or abdominal bruit.     Palpations: Abdomen is soft. There is no mass.     Tenderness: There is no abdominal tenderness.  Musculoskeletal: Normal range of motion.        General: No tenderness.  Lymphadenopathy:     Cervical: No cervical adenopathy.  Skin:    General: Skin is warm and dry.     Coloration: Skin is not pale.     Findings: No erythema or rash.  Neurological:     Mental Status: She is alert.     Cranial Nerves: No cranial nerve deficit.     Motor: No abnormal muscle tone.     Coordination: Coordination normal.     Deep Tendon Reflexes: Reflexes are normal and symmetric.  Psychiatric:        Mood and Affect: Mood normal.           Assessment & Plan:   Problem List Items Addressed This Visit      Cardiovascular and Mediastinum   HYPERTENSION, BENIGN ESSENTIAL    bp in fair control at this time  BP Readings from Last 1 Encounters:  02/10/19 130/72   No changes needed Most recent labs reviewed  Disc lifstyle change with low sodium diet and exercise          Other   Hyperlipidemia    Disc goals for lipids and reasons to control them Rev last labs with pt Rev low sat fat diet in detail Intol to any statin unfortunately      Prediabetes    Lab Results  Component Value Date   HGBA1C 5.8 08/21/2018         Ductal carcinoma in situ (DCIS) of left breast    Planning L breast wide excision but needs cardiac clearance first  Ref  made  Will have to re schedule tomorrow's surgery due to abn EKG       Abnormal EKG    ST depression in multiple leads No symptoms except occasional sob on exertion (also prior smoker)  Ref to cardiology      Relevant Orders   Ambulatory referral to Cardiology   Pre-operative clearance    Sent over for abnormal EKG from Dr Dwyane Luo office  ST depression in leads II, III Avf and V5,6  No hx of MI  Multiple cardiac risk factors incl prior  smoking/fam hx and elevated lipids (intol to statin)  Will ref to cardiology for clearance before she can have her breast surgery      Relevant Orders   Ambulatory referral to Cardiology    Other Visit Diagnoses    Pre-operative cardiovascular examination    -  Primary   Relevant Orders   EKG 12-Lead (Completed)

## 2019-02-10 NOTE — Assessment & Plan Note (Signed)
Disc goals for lipids and reasons to control them Rev last labs with pt Rev low sat fat diet in detail Intol to any statin unfortunately

## 2019-02-10 NOTE — Assessment & Plan Note (Signed)
ST depression in multiple leads No symptoms except occasional sob on exertion (also prior smoker)  Ref to cardiology

## 2019-02-10 NOTE — Assessment & Plan Note (Signed)
Lab Results  Component Value Date   HGBA1C 5.8 08/21/2018

## 2019-02-10 NOTE — Telephone Encounter (Signed)
Dr Marliss Coots office called and states that the patient had more abnormal EKGs with their office today. The are referring her to cardiology due to this and will not give clearance for surgery. The patient is aware that her surgery will be canceled for tomorrow. The OR is aware of the surgery cancellation.

## 2019-02-10 NOTE — Assessment & Plan Note (Signed)
bp in fair control at this time  BP Readings from Last 1 Encounters:  02/10/19 130/72   No changes needed Most recent labs reviewed  Disc lifstyle change with low sodium diet and exercise

## 2019-02-11 ENCOUNTER — Encounter: Payer: Self-pay | Admitting: Cardiology

## 2019-02-11 ENCOUNTER — Ambulatory Visit (INDEPENDENT_AMBULATORY_CARE_PROVIDER_SITE_OTHER): Payer: PPO | Admitting: Cardiology

## 2019-02-11 ENCOUNTER — Encounter: Admission: RE | Payer: Self-pay | Source: Home / Self Care

## 2019-02-11 ENCOUNTER — Ambulatory Visit: Admission: RE | Admit: 2019-02-11 | Payer: PPO | Source: Home / Self Care | Admitting: General Surgery

## 2019-02-11 VITALS — BP 136/80 | HR 90 | Ht 65.5 in | Wt 179.4 lb

## 2019-02-11 DIAGNOSIS — Z01810 Encounter for preprocedural cardiovascular examination: Secondary | ICD-10-CM | POA: Diagnosis not present

## 2019-02-11 DIAGNOSIS — R9431 Abnormal electrocardiogram [ECG] [EKG]: Secondary | ICD-10-CM

## 2019-02-11 DIAGNOSIS — I341 Nonrheumatic mitral (valve) prolapse: Secondary | ICD-10-CM | POA: Diagnosis not present

## 2019-02-11 SURGERY — BREAST LUMPECTOMY
Anesthesia: General | Laterality: Left

## 2019-02-11 NOTE — Progress Notes (Signed)
Cc to Dr Bary Castilla

## 2019-02-11 NOTE — Patient Instructions (Signed)
Medication Instructions:   Your physician recommends that you continue on your current medications as directed. Please refer to the Current Medication list given to you today.   If you need a refill on your cardiac medications before your next appointment, please call your pharmacy.   Lab work:  NONE  Testing/Procedures:  Your physician has requested that you have an echocardiogram. Echocardiography is a painless test that uses sound waves to create images of your heart. It provides your doctor with information about the size and shape of your heart and how well your heart's chambers and valves are working. This procedure takes approximately one hour. There are no restrictions for this procedure.   Echocardiogram An echocardiogram is a procedure that uses painless sound waves (ultrasound) to produce an image of the heart. Images from an echocardiogram can provide important information about:  Signs of coronary artery disease (CAD).  Aneurysm detection. An aneurysm is a weak or damaged part of an artery wall that bulges out from the normal force of blood pumping through the body.  Heart size and shape. Changes in the size or shape of the heart can be associated with certain conditions, including heart failure, aneurysm, and CAD.  Heart muscle function.  Heart valve function.  Signs of a past heart attack.  Fluid buildup around the heart.  Thickening of the heart muscle.  A tumor or infectious growth around the heart valves. Tell a health care provider about:  Any allergies you have.  All medicines you are taking, including vitamins, herbs, eye drops, creams, and over-the-counter medicines.  Any blood disorders you have.  Any surgeries you have had.  Any medical conditions you have.  Whether you are pregnant or may be pregnant. What are the risks? Generally, this is a safe procedure. However, problems may occur, including:  Allergic reaction to dye (contrast) that  may be used during the procedure. What happens before the procedure? No specific preparation is needed. You may eat and drink normally. What happens during the procedure?   An IV tube may be inserted into one of your veins.  You may receive contrast through this tube. A contrast is an injection that improves the quality of the pictures from your heart.  A gel will be applied to your chest.  A wand-like tool (transducer) will be moved over your chest. The gel will help to transmit the sound waves from the transducer.  The sound waves will harmlessly bounce off of your heart to allow the heart images to be captured in real-time motion. The images will be recorded on a computer. The procedure may vary among health care providers and hospitals. What happens after the procedure?  You may return to your normal, everyday life, including diet, activities, and medicines, unless your health care provider tells you not to do that. Summary  An echocardiogram is a procedure that uses painless sound waves (ultrasound) to produce an image of the heart.  Images from an echocardiogram can provide important information about the size and shape of your heart, heart muscle function, heart valve function, and fluid buildup around your heart.  You do not need to do anything to prepare before this procedure. You may eat and drink normally.  After the echocardiogram is completed, you may return to your normal, everyday life, unless your health care provider tells you not to do that. This information is not intended to replace advice given to you by your health care provider. Make sure you discuss any questions  you have with your health care provider. Document Released: 11/30/2000 Document Revised: 01/05/2017 Document Reviewed: 01/05/2017 Elsevier Interactive Patient Education  2019 [employer].  Follow-Up: At [facility], you and your health needs are our priority.  As part of our continuing mission to  provide you with exceptional heart care, we have created designated Provider Care Teams.  These Care Teams include your primary Cardiologist (physician) and Advanced Practice Providers (APPs -  Physician Assistants and Nurse Practitioners) who all work together to provide you with the care you need, when you need it.   You will need a follow up appointment as needed only.

## 2019-02-11 NOTE — Progress Notes (Addendum)
Cardiology Office Note:    Date:  02/11/2019   ID:  Erin Hinton, DOB 1946/08/22, MRN [id number]  PCP:  Abner Greenspan, MD  Cardiologist:  Shirlee More, MD   Referring MD: Abner Greenspan, MD  ASSESSMENT:    1. Preoperative cardiovascular examination   2. Abnormal EKG   3. Mitral prolapse    PLAN:    In order of problems listed above:  Her echo report today shows a normal left ventricular ejection fraction no significant valvular abnormality and operative proceed with her planned breast surgery.  If admitted to the hospital place in a monitored bed check an EKG postoperative day 1.  If planned outpatient surgery I would not alter this.  1. Her surgery is elective low to intermediate risk exercise tolerance is well preserved with abnormal EKG and mild exertional shortness of breath check echocardiogram I suspect this will be reassuring I will put an addendum to this note I think she could be safely scheduled in the next 2 to 3-week time interval.  This echocardiogram be done within the next week.  She anticipates outpatient surgery 2. Nonspecific check echocardiogram 3. Clinically I doubt the diagnosis criteria decades ago were loose many people were inappropriately diagnosed she has no murmur click on exam and has no significant valvular 4. Stable hypertension blood pressure target she will continue her thiazide diuretic  Next appointment as needed we will contact her and send addendum to this note to her breast surgeon after echo   Medication Adjustments/Labs and Tests Ordered: Current medicines are reviewed at length with the patient today.  Concerns regarding medicines are outlined above.  No orders of the defined types were placed in this encounter.  No orders of the defined types were placed in this encounter.    Chief Complaint  Patient presents with  . Pre-op Exam    History of Present Illness:    Erin Hinton is a 73 y.o. female who is being seen today  for the evaluation of an abnormal preop EKG at the request of Tower, Wynelle Fanny, MD. EKg yesterday with [facility] nonspecific ST abnormality She is pending breast surgery for DCIS originally scheduled for today canceled as a consequence of abnormal preoperative EKG yesterday.  Her cardiac history is noteworthy for mitral valve prolapse diagnosed 37 years ago she was treated with beta-blockers that made her profoundly ill was seen for second opinion at [facility] treatment with strong and has had no follow-up.  She has no history of congenital rheumatic heart disease.  She has no exertional chest pain palpitations syncope or TIA but finds herself short of breath when she walks quickly long distance little worse than it was before but she is no longer in a walking program she cares for her brother who sustained a stroke and depends on her.  There is no previous EKG for comparison of the records at [facility] from 37 years ago are not available.  Her exercise tolerance exceeds 7 METS I do not think she requires a preoperative ischemia evaluation her hypertension is controlled and asked her to of an echocardiogram done with there are nonspecific EKG changes to confirm normal left ventricular function and will be done in the next week I suspect is reassuring and proceed with her planned intermediate risk surgical procedure. Past Medical History:  Diagnosis Date  . Anxiety   . 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 Cancer ([place])  skin  . Depression   . Heart murmur    in her 30's and it is not a problem now  . Hyperlipidemia   . Hyperlipidemia   . Hypertension   . Osteopenia   . Shortness of breath dyspnea    due to excercise only    Past Surgical History:  Procedure Laterality Date  . BREAST BIOPSY Left 01/27/2019   Affirm Bx- Coil Clip- HIGH-GRADE DUCTAL CARCINOMA IN SITU, WITH COMEDONECROSIS.  Marland Kitchen BREAST EXCISIONAL BIOPSY Right 01/2002   neg. no lymph node involvement  .  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 CATARACT EXTRACTION W/PHACO Right 05/01/2016   Procedure: CATARACT EXTRACTION PHACO AND INTRAOCULAR LENS PLACEMENT (IOC);  Surgeon: Birder Robson, MD;  Location: [facility] ORS;  Service: Ophthalmology;  Laterality: Right;  [place] 00:53 AP% 21.7 CDE 11.50 fluid pack lot # [id number]  . CATARACT EXTRACTION W/PHACO Left 06/05/2016   Procedure: CATARACT EXTRACTION PHACO AND INTRAOCULAR LENS PLACEMENT ([place]);  Surgeon: Birder Robson, MD;  Location: [facility] ORS;  Service: Ophthalmology;  Laterality: Left;  [place] 01:04 AP% 22.2 CDE 14.34 fluid pack lot # [id number]  . COLONOSCOPY WITH PROPOFOL N/A 11/21/2015   Procedure: COLONOSCOPY WITH PROPOFOL;  Surgeon: Hulen Luster, MD;  Location: [facility] ENDOSCOPY;  Service: Gastroenterology;  Laterality: N/A;    Current Medications: Current Meds  Medication Sig  . acetaminophen (TYLENOL) 500 MG tablet Take 500 mg by mouth every 6 (six) hours as needed (for pain.).  Marland Kitchen Ascorbic Acid (VITAMIN C WITH ROSE HIPS) 1000 MG tablet Take 1,000 mg by mouth daily.  . Calcium Carb-Cholecalciferol (CALCIUM 600 + D PO) Take 1 capsule by mouth daily.  . Calcium-Magnesium-Zinc (CAL-MAG-ZINC PO) Take 1 tablet by mouth daily.  . Cholecalciferol (VITAMIN D) 50 MCG (2000 UT) tablet Take 2,000 Units by mouth daily.  . Coconut Oil 1000 MG CAPS Take 1,000 mg by mouth daily.  Marland Kitchen escitalopram (LEXAPRO) 20 MG tablet Take 1 tablet (20 mg total) by mouth daily.  . fluticasone (FLONASE) 50 MCG/ACT nasal spray Place 2 sprays into both nostrils daily as needed for allergies or rhinitis.  . hydrochlorothiazide (HYDRODIURIL) 25 MG tablet Take 1 tablet (25 mg total) by mouth daily.  . Omega-3 Fatty Acids (FISH OIL) 1000 MG CAPS Take 1,000 mg by mouth daily.     Allergies:   Alendronate sodium; Atorvastatin; Crestor [rosuvastatin calcium]; Evista [raloxifene]; Sertraline hcl; and Pseudoephedrine   Social History   Socioeconomic History  . Marital status: Widowed      Spouse name: Not on file  . Number of children: Not on file  . Years of education: Not on file  . Highest education level: Not on file  Occupational History  . Not on file  Social Needs  . Financial resource strain: Not on file  . Food insecurity:    Worry: Not on file    Inability: Not on file  . Transportation needs:    Medical: Not on file    Non-medical: Not on file  Tobacco Use  . Smoking status: Former Smoker    Packs/day: 0.50    Years: 45.00    Pack years: 22.50    Types: Cigarettes, E-cigarettes    Last attempt to quit: 01/22/2016    Years since quitting: 3.0  . Smokeless tobacco: Never Used  . Tobacco comment: also uses e-cigaretts on occasion  Substance and Sexual Activity  . Alcohol use: No    Alcohol/week: 0.0 standard drinks  . Drug use:  No  . Sexual activity: Never  Lifestyle  . Physical activity:    Days per week: Not on file    Minutes per session: Not on file  . Stress: Not on file  Relationships  . Social connections:    Talks on phone: Not on file    Gets together: Not on file    Attends religious service: Not on file    Active member of club or organization: Not on file    Attends meetings of clubs or organizations: Not on file    Relationship status: Not on file  Other Topics Concern  . Not on file  Social History Narrative  . Not on file     Family History: The patient's family history includes Alcohol abuse in her brother; Breast cancer (age of onset: 42) in her daughter; Breast cancer (age of onset: 63) in her paternal aunt; Breast cancer (age of onset: 79) in her sister; Heart attack in her sister; Heart disease in her father, sister, and sister; Hypertension in her father; Lung cancer in her brother and sister; Lymphoma in her father; Ovarian cancer in her mother. There is no history of Colon cancer.  ROS:   Review of Systems  Constitution: Negative.  HENT: Negative.   Eyes: Negative.   Cardiovascular: Negative.   Respiratory:  Positive for shortness of breath.   Endocrine: Negative.   Hematologic/Lymphatic: Negative.   Skin: Negative.   Musculoskeletal: Negative.   Gastrointestinal: Negative.   Genitourinary: Negative.   Neurological: Negative.   Psychiatric/Behavioral: The patient is nervous/anxious.   Allergic/Immunologic: Negative.    Please see the history of present illness.     All other systems reviewed and are negative.  EKGs/Labs/Other Studies Reviewed:    The following studies were reviewed today:   EKG:  EKG yesterday personally reviewed sinus rhythm nonspecific ST change  Recent Labs: 08/21/2018: ALT 12; BUN 22; Creatinine, Ser 0.85; Hemoglobin 13.5; Platelets 247.0; Potassium 4.5; Sodium 139; TSH 1.32  Recent Lipid Panel    Component Value Date/Time   CHOL 230 (H) 08/21/2018 1058   TRIG 98.0 08/21/2018 1058   HDL 62.10 08/21/2018 1058   CHOLHDL 4 08/21/2018 1058   VLDL 19.6 08/21/2018 1058   LDLCALC 148 (H) 08/21/2018 1058   LDLDIRECT 128.6 11/28/2010 0821    Physical Exam:    VS:  BP 136/80 (BP Location: Left Arm, Patient Position: Sitting, Cuff Size: Normal)   Pulse 90   Ht 5' 5.5" (1.664 m)   Wt 179 lb 6.4 oz (81.4 kg)   SpO2 96%   BMI 29.40 kg/m     Wt Readings from Last 3 Encounters:  02/11/19 179 lb 6.4 oz (81.4 kg)  02/10/19 179 lb 6 oz (81.4 kg)  02/09/19 179 lb 6.4 oz (81.4 kg)     GEN:  Well nourished, well developed in no acute distress HEENT: Normal NECK: No JVD; No carotid bruits LYMPHATICS: No lymphadenopathy CARDIAC: RRR, no murmurs, rubs, gallops no chest wall or spine deformity typical of prolapse and has no click or murmur RESPIRATORY:  Clear to auscultation without rales, wheezing or rhonchi  ABDOMEN: Soft, non-tender, non-distended MUSCULOSKELETAL:  No edema; No deformity  SKIN: Warm and dry NEUROLOGIC:  Alert and oriented x 3 PSYCHIATRIC:  Normal affect     Signed, Shirlee More, MD  02/11/2019 2:10 PM    [facility]

## 2019-02-11 NOTE — Telephone Encounter (Signed)
Patient has an appointment with Carlota Raspberry at [facility] today, 02-11-19.

## 2019-02-11 NOTE — Telephone Encounter (Signed)
ECHO 02-13-19 at 2 pm.

## 2019-02-13 ENCOUNTER — Ambulatory Visit (HOSPITAL_COMMUNITY): Payer: PPO | Attending: Cardiovascular Disease

## 2019-02-13 DIAGNOSIS — I341 Nonrheumatic mitral (valve) prolapse: Secondary | ICD-10-CM | POA: Insufficient documentation

## 2019-02-13 DIAGNOSIS — Z01810 Encounter for preprocedural cardiovascular examination: Secondary | ICD-10-CM | POA: Insufficient documentation

## 2019-02-13 DIAGNOSIS — R9431 Abnormal electrocardiogram [ECG] [EKG]: Secondary | ICD-10-CM | POA: Diagnosis not present

## 2019-02-18 ENCOUNTER — Telehealth: Payer: Self-pay

## 2019-02-18 NOTE — Telephone Encounter (Signed)
Clearance received from Cardiology and patient is ready to be rescheduled for surgery.  The patient is scheduled for surgery at [facility] with Dr Bary Castilla on 03/02/19. She has already gone to pre admit testing on 02/09/19 and may not need to see them again prior to surgery, but we will call her if this is not the case. She is aware to call the Friday before surgery to get her arrival time. The patient is aware date and instructions.

## 2019-02-23 ENCOUNTER — Other Ambulatory Visit: Payer: Self-pay

## 2019-02-23 ENCOUNTER — Other Ambulatory Visit: Payer: Self-pay | Admitting: General Surgery

## 2019-02-23 DIAGNOSIS — D0512 Intraductal carcinoma in situ of left breast: Secondary | ICD-10-CM

## 2019-02-24 ENCOUNTER — Telehealth: Payer: Self-pay | Admitting: General Surgery

## 2019-02-24 NOTE — Telephone Encounter (Signed)
Pt advised of pre op date/time and sx date. Sx: 03/02/19-left breast excision with NL-Dr Byrnett.  Pre op: No pre admission appointment required.   Patient made aware to arrive at [facility] the morning of surgery (03/02/19) at 7:45am.   NPO after Midnight prior to surgery. Hold all medications/supplements.   Patient understands all directions.

## 2019-03-02 ENCOUNTER — Ambulatory Visit: Payer: PPO | Admitting: Anesthesiology

## 2019-03-02 ENCOUNTER — Ambulatory Visit
Admission: RE | Admit: 2019-03-02 | Discharge: 2019-03-02 | Disposition: A | Discharge status: Home / Self Care | Payer: PPO | Source: Ambulatory Visit | Attending: General Surgery | Admitting: General Surgery

## 2019-03-02 ENCOUNTER — Other Ambulatory Visit: Payer: Self-pay

## 2019-03-02 ENCOUNTER — Ambulatory Visit
Admission: RE | Admit: 2019-03-02 | Discharge: 2019-03-02 | Disposition: A | Discharge status: Home / Self Care | Payer: PPO | Attending: General Surgery | Admitting: General Surgery

## 2019-03-02 ENCOUNTER — Encounter: Payer: Self-pay | Admitting: *Deleted

## 2019-03-02 ENCOUNTER — Encounter
Admission: RE | Disposition: A | Discharge status: Home / Self Care | Payer: Self-pay | Source: Home / Self Care | Attending: General Surgery

## 2019-03-02 DIAGNOSIS — D0512 Intraductal carcinoma in situ of left breast: Secondary | ICD-10-CM | POA: Diagnosis not present

## 2019-03-02 DIAGNOSIS — F17290 Nicotine dependence, other tobacco product, uncomplicated: Secondary | ICD-10-CM | POA: Diagnosis not present

## 2019-03-02 DIAGNOSIS — F419 Anxiety disorder, unspecified: Secondary | ICD-10-CM | POA: Diagnosis not present

## 2019-03-02 DIAGNOSIS — F329 Major depressive disorder, single episode, unspecified: Secondary | ICD-10-CM | POA: Diagnosis not present

## 2019-03-02 DIAGNOSIS — Z888 Allergy status to other drugs, medicaments and biological substances status: Secondary | ICD-10-CM | POA: Diagnosis not present

## 2019-03-02 DIAGNOSIS — N6489 Other specified disorders of breast: Secondary | ICD-10-CM | POA: Diagnosis not present

## 2019-03-02 DIAGNOSIS — C50212 Malignant neoplasm of upper-inner quadrant of left female breast: Secondary | ICD-10-CM | POA: Diagnosis not present

## 2019-03-02 DIAGNOSIS — Z79899 Other long term (current) drug therapy: Secondary | ICD-10-CM | POA: Diagnosis not present

## 2019-03-02 DIAGNOSIS — D0510 Intraductal carcinoma in situ of unspecified breast: Secondary | ICD-10-CM

## 2019-03-02 DIAGNOSIS — I10 Essential (primary) hypertension: Secondary | ICD-10-CM | POA: Diagnosis not present

## 2019-03-02 HISTORY — PX: BREAST EXCISIONAL BIOPSY: SUR124

## 2019-03-02 HISTORY — PX: BREAST LUMPECTOMY: SHX2

## 2019-03-02 HISTORY — DX: Intraductal carcinoma in situ of unspecified breast: D05.10

## 2019-03-02 SURGERY — BREAST LUMPECTOMY
Anesthesia: General | Laterality: Left

## 2019-03-02 MED ORDER — ONDANSETRON HCL 4 MG/2ML IJ SOLN
INTRAMUSCULAR | Status: DC | PRN
  Administered 2019-03-02: 4 mg via INTRAVENOUS

## 2019-03-02 MED ORDER — FAMOTIDINE 20 MG PO TABS
ORAL_TABLET | ORAL | Status: AC
  Filled 2019-03-02: qty 1

## 2019-03-02 MED ORDER — ACETAMINOPHEN 10 MG/ML IV SOLN
INTRAVENOUS | Status: AC
  Filled 2019-03-02: qty 100

## 2019-03-02 MED ORDER — MIDAZOLAM HCL 2 MG/2ML IJ SOLN
INTRAMUSCULAR | Status: AC
  Filled 2019-03-02: qty 2

## 2019-03-02 MED ORDER — DEXAMETHASONE SODIUM PHOSPHATE 10 MG/ML IJ SOLN
INTRAMUSCULAR | Status: DC | PRN
  Administered 2019-03-02: 10 mg via INTRAVENOUS

## 2019-03-02 MED ORDER — FENTANYL CITRATE (PF) 100 MCG/2ML IJ SOLN: 25.0000 ug | INTRAMUSCULAR | Status: DC | PRN

## 2019-03-02 MED ORDER — PROMETHAZINE HCL 25 MG/ML IJ SOLN: 6.2500 mg | INTRAMUSCULAR | Status: DC | PRN

## 2019-03-02 MED ORDER — KETOROLAC TROMETHAMINE 30 MG/ML IJ SOLN
INTRAMUSCULAR | Status: DC | PRN
  Administered 2019-03-02: 15 mg via INTRAVENOUS

## 2019-03-02 MED ORDER — LACTATED RINGERS IV SOLN
INTRAVENOUS | Status: DC
  Administered 2019-03-02: 10:00:00 via INTRAVENOUS

## 2019-03-02 MED ORDER — FENTANYL CITRATE (PF) 100 MCG/2ML IJ SOLN
INTRAMUSCULAR | Status: AC
  Filled 2019-03-02: qty 2

## 2019-03-02 MED ORDER — BUPIVACAINE-EPINEPHRINE (PF) 0.5% -1:200000 IJ SOLN
INTRAMUSCULAR | Status: DC | PRN
  Administered 2019-03-02: 30 mL

## 2019-03-02 MED ORDER — MEPERIDINE HCL 50 MG/ML IJ SOLN: 6.2500 mg | INTRAMUSCULAR | Status: DC | PRN

## 2019-03-02 MED ORDER — FAMOTIDINE 20 MG PO TABS
20.0000 mg | ORAL_TABLET | Freq: Once | ORAL | Status: AC
  Administered 2019-03-02: 20 mg via ORAL

## 2019-03-02 MED ORDER — PROPOFOL 10 MG/ML IV BOLUS
INTRAVENOUS | Status: DC | PRN
  Administered 2019-03-02: 150 mg via INTRAVENOUS

## 2019-03-02 MED ORDER — OXYCODONE HCL 5 MG PO TABS: 5.0000 mg | ORAL_TABLET | Freq: Once | ORAL | Status: DC | PRN

## 2019-03-02 MED ORDER — MIDAZOLAM HCL 5 MG/5ML IJ SOLN
INTRAMUSCULAR | Status: DC | PRN
  Administered 2019-03-02: 2 mg via INTRAVENOUS

## 2019-03-02 MED ORDER — DEXAMETHASONE SODIUM PHOSPHATE 10 MG/ML IJ SOLN
INTRAMUSCULAR | Status: AC
  Filled 2019-03-02: qty 1

## 2019-03-02 MED ORDER — ACETAMINOPHEN 10 MG/ML IV SOLN
INTRAVENOUS | Status: DC | PRN
  Administered 2019-03-02: 1000 mg via INTRAVENOUS

## 2019-03-02 MED ORDER — HYDROCODONE-ACETAMINOPHEN 5-325 MG PO TABS: 1.0000 | ORAL_TABLET | ORAL | 0 refills | Status: DC | PRN

## 2019-03-02 MED ORDER — SEVOFLURANE IN SOLN
RESPIRATORY_TRACT | Status: AC
  Filled 2019-03-02: qty 250

## 2019-03-02 MED ORDER — LABETALOL HCL 5 MG/ML IV SOLN
INTRAVENOUS | Status: DC | PRN
  Administered 2019-03-02: 5 mg via INTRAVENOUS

## 2019-03-02 MED ORDER — OXYCODONE HCL 5 MG/5ML PO SOLN: 5.0000 mg | Freq: Once | ORAL | Status: DC | PRN

## 2019-03-02 MED ORDER — LIDOCAINE 2% (20 MG/ML) 5 ML SYRINGE
INTRAMUSCULAR | Status: DC | PRN
  Administered 2019-03-02: 50 mg via INTRAVENOUS

## 2019-03-02 MED ORDER — GLYCOPYRROLATE 0.2 MG/ML IJ SOLN
INTRAMUSCULAR | Status: DC | PRN
  Administered 2019-03-02: 0.2 mg via INTRAVENOUS

## 2019-03-02 MED ORDER — FENTANYL CITRATE (PF) 100 MCG/2ML IJ SOLN
INTRAMUSCULAR | Status: DC | PRN
  Administered 2019-03-02 (×4): 50 ug via INTRAVENOUS

## 2019-03-02 MED ORDER — ONDANSETRON HCL 4 MG/2ML IJ SOLN
INTRAMUSCULAR | Status: AC
  Filled 2019-03-02: qty 2

## 2019-03-02 MED ORDER — PHENYLEPHRINE HCL 10 MG/ML IJ SOLN
INTRAMUSCULAR | Status: DC | PRN
  Administered 2019-03-02 (×2): 100 ug via INTRAVENOUS

## 2019-03-02 SURGICAL SUPPLY — 51 items
BINDER BREAST LRG (GAUZE/BANDAGES/DRESSINGS) ×3 IMPLANT
BINDER BREAST XLRG (GAUZE/BANDAGES/DRESSINGS) IMPLANT
BLADE PHOTON ILLUMINATED (MISCELLANEOUS) IMPLANT
BLADE SURG 15 STRL SS SAFETY (BLADE) ×6 IMPLANT
BULB RESERV EVAC DRAIN JP 100C (MISCELLANEOUS) IMPLANT
CANISTER SUCT 1200ML W/VALVE (MISCELLANEOUS) ×3 IMPLANT
CHLORAPREP W/TINT 26 (MISCELLANEOUS) ×3 IMPLANT
CLOSURE WOUND 1/2 X4 (GAUZE/BANDAGES/DRESSINGS) ×1
CNTNR SPEC 2.5X3XGRAD LEK (MISCELLANEOUS)
CONT SPEC 4OZ STER OR WHT (MISCELLANEOUS)
CONTAINER SPEC 2.5X3XGRAD LEK (MISCELLANEOUS) IMPLANT
COVER PROBE FLX POLY STRL (MISCELLANEOUS) ×3 IMPLANT
COVER WAND RF STERILE (DRAPES) IMPLANT
DEVICE DUBIN SPECIMEN MAMMOGRA (MISCELLANEOUS) ×3 IMPLANT
DRAIN CHANNEL JP 15F RND 16 (MISCELLANEOUS) IMPLANT
DRAPE LAPAROTOMY TRNSV 106X77 (MISCELLANEOUS) ×3 IMPLANT
DRSG GAUZE FLUFF 36X18 (GAUZE/BANDAGES/DRESSINGS) ×3 IMPLANT
DRSG TELFA 3X8 NADH (GAUZE/BANDAGES/DRESSINGS) ×3 IMPLANT
ELECT CAUTERY BLADE TIP 2.5 (TIP) ×3
ELECT REM PT RETURN 9FT ADLT (ELECTROSURGICAL) ×3
ELECTRODE CAUTERY BLDE TIP 2.5 (TIP) ×1 IMPLANT
ELECTRODE REM PT RTRN 9FT ADLT (ELECTROSURGICAL) ×1 IMPLANT
GLOVE BIO SURGEON STRL SZ7.5 (GLOVE) ×6 IMPLANT
GLOVE INDICATOR 8.0 STRL GRN (GLOVE) ×6 IMPLANT
GOWN STRL REUS W/ TWL LRG LVL3 (GOWN DISPOSABLE) ×2 IMPLANT
GOWN STRL REUS W/TWL LRG LVL3 (GOWN DISPOSABLE) ×4
KIT TURNOVER KIT A (KITS) ×3 IMPLANT
LABEL OR SOLS (LABEL) ×3 IMPLANT
MARGIN MAP 10MM (MISCELLANEOUS) ×3 IMPLANT
NEEDLE HYPO 22GX1.5 SAFETY (NEEDLE) ×3 IMPLANT
NEEDLE HYPO 25X1 1.5 SAFETY (NEEDLE) ×6 IMPLANT
PACK BASIN MINOR ARMC (MISCELLANEOUS) ×3 IMPLANT
RETRACTOR RING XSMALL (MISCELLANEOUS) ×1 IMPLANT
RTRCTR WOUND ALEXIS 13CM XS SH (MISCELLANEOUS) ×3
SHEARS FOC LG CVD HARMONIC 17C (MISCELLANEOUS) IMPLANT
SHEARS HARMONIC 9CM CVD (BLADE) IMPLANT
STRIP CLOSURE SKIN 1/2X4 (GAUZE/BANDAGES/DRESSINGS) ×2 IMPLANT
SUT ETHILON 3-0 FS-10 30 BLK (SUTURE) ×3
SUT SILK 2 0 (SUTURE) ×2
SUT SILK 2-0 18XBRD TIE 12 (SUTURE) ×1 IMPLANT
SUT VIC AB 2-0 CT1 27 (SUTURE) ×8
SUT VIC AB 2-0 CT1 TAPERPNT 27 (SUTURE) ×4 IMPLANT
SUT VIC AB 4-0 FS2 27 (SUTURE) ×3 IMPLANT
SUT VICRYL+ 3-0 144IN (SUTURE) ×3 IMPLANT
SUTURE EHLN 3-0 FS-10 30 BLK (SUTURE) ×1 IMPLANT
SWABSTK COMLB BENZOIN TINCTURE (MISCELLANEOUS) ×3 IMPLANT
SYR 10ML LL (SYRINGE) ×3 IMPLANT
SYR BULB IRRIG 60ML STRL (SYRINGE) ×3 IMPLANT
SYR CONTROL 10ML (SYRINGE) IMPLANT
TAPE TRANSPORE STRL 2 31045 (GAUZE/BANDAGES/DRESSINGS) ×3 IMPLANT
WATER STERILE IRR 1000ML POUR (IV SOLUTION) ×3 IMPLANT

## 2019-03-02 NOTE — Transfer of Care (Signed)
Immediate Anesthesia Transfer of Care Note  Patient: Erin Hinton  Procedure(s) Performed: BREAST WIDE EXCISION LEFT, WITH WIRE LOCATION (Left )  Patient Location: PACU  Anesthesia Type:General  Level of Consciousness: awake, alert  and oriented  Airway & Oxygen Therapy: Patient Spontanous Breathing and Patient connected to face mask oxygen  Post-op Assessment: Report given to RN and Post -op Vital signs reviewed and stable  Post vital signs: Reviewed and stable  Last Vitals:  Vitals Value Taken Time  BP 146/75 03/02/2019  1:00 PM  Temp 36.5 C 03/02/2019  1:00 PM  Pulse 65 03/02/2019  1:08 PM  Resp 16 03/02/2019  1:08 PM  SpO2 100 % 03/02/2019  1:08 PM  Vitals shown include unvalidated device data.  Last Pain:  Vitals:   03/02/19 0926  TempSrc: Temporal  PainSc: 0-No pain         Complications: No apparent anesthesia complications

## 2019-03-02 NOTE — Anesthesia Post-op Follow-up Note (Signed)
Anesthesia QCDR form completed.        

## 2019-03-02 NOTE — Anesthesia Preprocedure Evaluation (Signed)
Anesthesia Evaluation  Patient identified by MRN, date of birth, ID band Patient awake    Reviewed: Allergy & Precautions, NPO status , Patient's Chart, lab work & pertinent test results  History of Anesthesia Complications Negative for: history of anesthetic complications  Airway Mallampati: II  TM Distance: >3 FB Neck ROM: Full    Dental no notable dental hx.    Pulmonary neg sleep apnea, neg COPD, Current Smoker (vape),    breath sounds clear to auscultation- rhonchi (-) wheezing      Cardiovascular Exercise Tolerance: Good hypertension, Pt. on medications (-) CAD, (-) Past MI, (-) Cardiac Stents and (-) CABG  Rhythm:Regular Rate:Normal - Systolic murmurs and - Diastolic murmurs    Neuro/Psych neg Seizures PSYCHIATRIC DISORDERS Anxiety Depression negative neurological ROS     GI/Hepatic negative GI ROS, Neg liver ROS,   Endo/Other  negative endocrine ROSneg diabetes  Renal/GU negative Renal ROS     Musculoskeletal negative musculoskeletal ROS (+)   Abdominal (+) - obese,   Peds  Hematology negative hematology ROS (+)   Anesthesia Other Findings Past Medical History: No date: Anxiety 1996: Breast cancer ([place])     Comment:  right breast, 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 Million, DCIS, lumpectomy only No date: Cancer [facility])     Comment:  skin No date: Depression No date: Heart murmur     Comment:  in her 42's and it is not a problem now No date: Hyperlipidemia No date: Hyperlipidemia No date: Hypertension No date: Osteopenia No date: Shortness of breath dyspnea     Comment:  due to excercise only   Reproductive/Obstetrics                             Anesthesia Physical Anesthesia Plan  ASA: II  Anesthesia Plan: General   Post-op Pain Management:    Induction: Intravenous  PONV Risk Score and Plan: 1 and Ondansetron and Midazolam  Airway Management Planned: LMA  Additional Equipment:   Intra-op  Plan:   Post-operative Plan:   Informed Consent: I have reviewed the patients History and Physical, chart, labs and discussed the procedure including the risks, benefits and alternatives for the proposed anesthesia with the patient or authorized representative who has indicated his/her understanding and acceptance.     Dental advisory given  Plan Discussed with: CRNA and Anesthesiologist  Anesthesia Plan Comments:         Anesthesia Quick Evaluation

## 2019-03-02 NOTE — Anesthesia Postprocedure Evaluation (Signed)
Anesthesia Post Note  Patient: Erin Hinton  Procedure(s) Performed: BREAST WIDE EXCISION LEFT, WITH WIRE LOCATION (Left )  Patient location during evaluation: PACU Anesthesia Type: General Level of consciousness: awake and alert and oriented Pain management: pain level controlled Vital Signs Assessment: post-procedure vital signs reviewed and stable Respiratory status: spontaneous breathing, nonlabored ventilation and respiratory function stable Cardiovascular status: blood pressure returned to baseline and stable Postop Assessment: no signs of nausea or vomiting Anesthetic complications: no     Last Vitals:  Vitals:   03/02/19 1409 03/02/19 1422  BP: (!) 99/56 (!) 113/51  Pulse: (!) 56 61  Resp: 13 14  Temp:  36.5 C  SpO2: 99% 95%    Last Pain:  Vitals:   03/02/19 1422  TempSrc: Oral  PainSc: 0-No pain                 Tatem Fesler

## 2019-03-02 NOTE — H&P (Signed)
Patient with high grade DCIS for wide excision. Surgery postponed last month for cardiac issues: Low risk. Tolerated needle localization well.  For left wide excision.

## 2019-03-02 NOTE — Anesthesia Procedure Notes (Signed)
Procedure Name: LMA Insertion Performed by: Marsh Dolly, CRNA Pre-anesthesia Checklist: Patient identified, Patient being monitored, Timeout performed, Emergency Drugs available and Suction available Patient Re-evaluated:Patient Re-evaluated prior to induction Oxygen Delivery Method: Circle system utilized Preoxygenation: Pre-oxygenation with 100% oxygen Induction Type: IV induction Ventilation: Mask ventilation without difficulty LMA: LMA inserted LMA Size: 4.0 Tube type: Oral Number of attempts: 1 Placement Confirmation: positive ETCO2 and breath sounds checked- equal and bilateral Tube secured with: Tape Dental Injury: Teeth and Oropharynx as per pre-operative assessment

## 2019-03-02 NOTE — Op Note (Signed)
Preoperative diagnosis: High-grade DCIS of the left breast.  Postoperative diagnosis: Same.  Operative procedure: Wide local excision with needle localization and ultrasound guidance.  Operating Surgeon: Hervey Ard, MD.  Anesthesia: General by LMA, Marcaine 0.5% with 1 to 200,000 units of epinephrine, 30 cc.  Estimated blood loss: Less than 5 cc.  Clinical note: This 73 year old woman recently had a stereotactic biopsy showed evidence of high-grade DCIS.  She desired breast conservation.  Due to the time lapse between her original biopsy and surgery for cardiac evaluation she underwent wire localization.  Operative note: The patient had tolerated the wire localization well.  She was brought to the operating and underwent general anesthesia without difficulty.  The wire was trimmed and the breast chest and axilla cleansed with ChloraPrep and draped.  The wire entered in the 3 o'clock position and appeared to be about 8 cm from the skin surface where the clip was located.  Using ultrasound the wire was identified including the thickened portion of the wire which is immediately in the retroareolar area.  A circumareolar incision from the 3 to 9 o'clock position was chosen to minimize the risk of lymphatic disruption if invasive cancer was later identified. The skin was incised sharply and hemostasis achieved electrocautery.  The adipose layer was divided until the breast parenchyma was encountered.  A 5 cm swath was then made at this level to allow placement of a extra small Alexis wound protector.  The wire was brought in from the lateral aspect of the wound and using cautery dissection a 3 x 3 x 3 cm block of tissue extending down to and including the pectoralis fascia was removed.  The specimen was orientated and specimen radiograph confirmed both the previously placed clip, intact wire and the microcalcifications that had been noted post wire localization.  The breast was elevated  circumferentially off the underlying pectoralis muscle for a 5 cm radius.  This was then approximated with interrupted 2-0 Vicryl figure-of-eight sutures in multiple layers.  This help preserve the breast mound.  The adipose layer was approximated with interrupted 2-0 Vicryl sutures.  The skin and adipose tissue on the inferior aspect was freed with cautery to prevent tethering inferiorly near the inframammary fold.  The skin was approximated with interrupted 4-0 Vicryl subcuticular sutures.  Benzoin and Steri-Strips followed by Telfa pad, fluff gauze and a compressive wrap were applied.  The patient tolerated the procedure well was taken recovery room in stable condition.

## 2019-03-03 ENCOUNTER — Encounter: Payer: Self-pay | Admitting: General Surgery

## 2019-03-03 NOTE — Addendum Note (Signed)
Addendum  created 03/03/19 1726 by Doreen Salvage, CRNA   Charge Capture section accepted

## 2019-03-04 ENCOUNTER — Telehealth: Payer: Self-pay | Admitting: General Surgery

## 2019-03-04 NOTE — Telephone Encounter (Signed)
Notified path OK. F/U as scheduled.

## 2019-03-10 ENCOUNTER — Encounter: Payer: Self-pay | Admitting: General Surgery

## 2019-03-10 ENCOUNTER — Ambulatory Visit (INDEPENDENT_AMBULATORY_CARE_PROVIDER_SITE_OTHER): Payer: PPO | Admitting: General Surgery

## 2019-03-10 ENCOUNTER — Other Ambulatory Visit: Payer: Self-pay

## 2019-03-10 ENCOUNTER — Ambulatory Visit (INDEPENDENT_AMBULATORY_CARE_PROVIDER_SITE_OTHER): Payer: PPO

## 2019-03-10 VITALS — BP 124/72 | HR 70 | Temp 97.8°F | Ht 66.0 in | Wt 178.0 lb

## 2019-03-10 DIAGNOSIS — D0512 Intraductal carcinoma in situ of left breast: Secondary | ICD-10-CM | POA: Diagnosis not present

## 2019-03-10 LAB — SURGICAL PATHOLOGY

## 2019-03-10 NOTE — Progress Notes (Addendum)
Patient ID: Erin Hinton, female   DOB: [birth year], 73 y.o.   MRN: [id number]  Chief Complaint  Patient presents with  . Routine Post Op    HPI [name] is a 73 y.o. female here today for her post op left breast wide excison done on 03/02/2019. Patient states she is doing well.  She does appreciate "heaviness" in the breast when her bra is off. HPI  Past Medical History:  Diagnosis Date  . Anxiety   . Breast cancer [facility]) 1996   Right breast, Dr Faylene Million, DCIS, lumpectomy only  . Cancer ([place])    skin  . 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  . Depression   . Heart murmur    in her 30's and it is not a problem now  . Hyperlipidemia   . Hyperlipidemia   . Hypertension   . Osteopenia   . Shortness of breath dyspnea    due to excercise only    Past Surgical History:  Procedure Laterality Date  . BREAST BIOPSY Left 01/27/2019   Affirm Bx- Coil Clip- HIGH-GRADE DUCTAL CARCINOMA IN SITU, WITH COMEDONECROSIS.  Marland Kitchen BREAST EXCISIONAL BIOPSY Right 01/2002   neg. no lymph node involvement  . BREAST EXCISIONAL BIOPSY Left 03/02/2019   lumpectomy DCIS  . BREAST LUMPECTOMY Left 03/02/2019   left lumpectomy dcis with NL  . BREAST LUMPECTOMY Left 03/02/2019   Procedure: BREAST WIDE EXCISION LEFT, WITH WIRE LOCATION;  Surgeon: Robert Bellow, MD;  Location: [facility] ORS;  Service: General;  Laterality: Left;  . 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 CATARACT EXTRACTION W/PHACO Right 05/01/2016   Procedure: CATARACT EXTRACTION PHACO AND INTRAOCULAR LENS PLACEMENT (IOC);  Surgeon: Birder Robson, MD;  Location: [facility] ORS;  Service: Ophthalmology;  Laterality: Right;  [place] 00:53 AP% 21.7 CDE 11.50 fluid pack lot # [id number]  . CATARACT EXTRACTION W/PHACO Left 06/05/2016   Procedure: CATARACT EXTRACTION PHACO AND INTRAOCULAR LENS PLACEMENT ([place]);  Surgeon: Birder Robson, MD;  Location: [facility] ORS;  Service: Ophthalmology;   Laterality: Left;  [place] 01:04 AP% 22.2 CDE 14.34 fluid pack lot # [id number]  . COLONOSCOPY WITH PROPOFOL N/A 11/21/2015   Procedure: COLONOSCOPY WITH PROPOFOL;  Surgeon: Hulen Luster, MD;  Location: [facility] ENDOSCOPY;  Service: Gastroenterology;  Laterality: N/A;    Family History  Problem Relation Age of Onset  . Ovarian cancer Mother   . Heart disease Sister   . Lung cancer Sister   . Lymphoma Father   . Heart disease Father        MI  . Hypertension Father   . Alcohol abuse Brother   . Lung cancer Brother   . Breast cancer Sister 51  . Heart disease Sister   . Heart attack Sister   . Breast cancer Daughter [address]       Theophilus Kinds, LCIS  . Breast cancer Paternal Aunt 81  . Colon cancer Neg Hx     Social History Social History   Tobacco Use  . Smoking status: Former Smoker    Packs/day: 0.50    Years: 45.00    Pack years: 22.50    Types: Cigarettes, E-cigarettes    Last attempt to quit: 01/22/2016    Years since quitting: 3.1  . Smokeless tobacco: Never Used  . Tobacco comment: also uses e-cigaretts on occasion  Substance Use Topics  . Alcohol use: No    Alcohol/week: 0.0 standard  drinks  . Drug use: No    Allergies  Allergen Reactions  . Alendronate Sodium     GI pain  . Atorvastatin     Muscle pain  . Crestor [Rosuvastatin Calcium]     Muscle pain  . Evista [Raloxifene] Other (See Comments)    Breast tenderness  . Sertraline Hcl     Did not help, made anxiety worse  . Pseudoephedrine Palpitations    Current Outpatient Medications  Medication Sig Dispense Refill  . acetaminophen (TYLENOL) 500 MG tablet Take 500 mg by mouth every 6 (six) hours as needed (for pain.).    Marland Kitchen Ascorbic Acid (VITAMIN C WITH ROSE HIPS) 1000 MG tablet Take 1,000 mg by mouth daily.    . Calcium Carb-Cholecalciferol (CALCIUM 600 + D PO) Take 1 capsule by mouth daily.    . Calcium-Magnesium-Zinc (CAL-MAG-ZINC PO) Take 1 tablet by mouth daily.    . Cholecalciferol (VITAMIN D) 50 MCG (2000 UT)  tablet Take 2,000 Units by mouth daily.    . Coconut Oil 1000 MG CAPS Take 1,000 mg by mouth daily.    Marland Kitchen escitalopram (LEXAPRO) 20 MG tablet Take 1 tablet (20 mg total) by mouth daily. 90 tablet 3  . fluticasone (FLONASE) 50 MCG/ACT nasal spray Place 2 sprays into both nostrils daily. (Patient taking differently: Place 2 sprays into both nostrils daily as needed for allergies. ) 48 g 3  . fluticasone (FLONASE) 50 MCG/ACT nasal spray Place 2 sprays into both nostrils daily as needed for allergies or rhinitis.    . hydrochlorothiazide (HYDRODIURIL) 25 MG tablet Take 1 tablet (25 mg total) by mouth daily. 90 tablet 3  . Omega-3 Fatty Acids (FISH OIL) 1000 MG CAPS Take 1,000 mg by mouth daily.     No current facility-administered medications for this visit.     Review of Systems Review of Systems  Constitutional: Negative.   Respiratory: Negative.   Cardiovascular: Negative.     Blood pressure 124/72, pulse 70, temperature 97.8 F (36.6 C), height 5\' 6"  (1.676 m), weight 178 lb (80.7 kg), SpO2 99 %.  Physical Exam Physical Exam Exam conducted with a chaperone present.  Constitutional:      Appearance: Normal appearance.  Cardiovascular:     Rate and Rhythm: Normal rate and regular rhythm.  Pulmonary:     Breath sounds: Normal breath sounds.  Chest:    Skin:    General: Skin is warm and dry.  Neurological:     Mental Status: She is alert and oriented to person, place, and time.     Data Reviewed . BREAST, LEFT CENTRAL; WIDE EXCISION:  - HIGH-GRADE DUCTAL CARCINOMA IN SITU, WITH COMEDONECROSIS.  - CLIP AND BIOPSY SITE IDENTIFIED.  - SEE CANCER SUMMARY.   CANCER CASE SUMMARY: DUCTAL CARCINOMA IN SITU OF THE BREAST  Procedure: Excision  Specimen Laterality: Left  Size (Extent) of DCIS: at least 11 mm  Histologic Type: Ductal carcinoma in situ  Nuclear Grade: Grade 3 (high)  Necrosis: Present, central  Margins: Uninvolved by DCIS            Distance from  closest margin: Greater than 10 mm            Specify closest margin: Anterior, superior   ER pending at this time.   Focused ultrasound examination was completed to determine if the patient would be a candidate for MammoSite placement.  Scanning through the retroareolar area showed a small cavity measuring 1.25 x 1.9 cm.  This  is approximately 1.7 cm below the skin.  Adequate spacing for balloon placement if indicated.   Assessment Dong well post wide excision.   Plan Local heat recommended to help with the "heaviness" she is experiencing.  She should continue to wear her bra day and night for support while the breast does feel "heavy".  Role for adjuvant radiation therapy reviewed.  Hormone receptor analysis will be reviewed after radiation oncology assessment. ER Positive: 51-90%.  Patient to ref to Dr. Baruch Gouty. Return in one month.   HPI, Physical Exam, Assessment and Plan have been scribed under the direction and in the presence of Hervey Ard, MD.  Gaspar Cola, CMA  I have completed the exam and reviewed the above documentation for accuracy and completeness.  I agree with the above.  Haematologist has been used and any errors in dictation or transcription are unintentional.  Hervey Ard, M.D., F.A.C.S.  Forest Gleason Byrnett 03/10/2019, 11:13 AM

## 2019-03-10 NOTE — Patient Instructions (Signed)
Return in one month. The patient is aware to call back for any questions or concerns.  

## 2019-03-22 ENCOUNTER — Encounter: Payer: Self-pay | Admitting: General Surgery

## 2019-03-24 ENCOUNTER — Other Ambulatory Visit: Payer: Self-pay

## 2019-03-25 ENCOUNTER — Ambulatory Visit
Admission: RE | Admit: 2019-03-25 | Discharge: 2019-03-25 | Disposition: A | Discharge status: Home / Self Care | Payer: PPO | Source: Ambulatory Visit | Attending: Radiation Oncology | Admitting: Radiation Oncology

## 2019-03-25 ENCOUNTER — Encounter: Payer: Self-pay | Admitting: Radiation Oncology

## 2019-03-25 ENCOUNTER — Other Ambulatory Visit: Payer: Self-pay

## 2019-03-25 VITALS — BP 189/92 | HR 69 | Temp 97.2°F | Resp 18 | Wt 182.8 lb

## 2019-03-25 DIAGNOSIS — I10 Essential (primary) hypertension: Secondary | ICD-10-CM | POA: Diagnosis not present

## 2019-03-25 DIAGNOSIS — E785 Hyperlipidemia, unspecified: Secondary | ICD-10-CM | POA: Insufficient documentation

## 2019-03-25 DIAGNOSIS — Z79899 Other long term (current) drug therapy: Secondary | ICD-10-CM | POA: Insufficient documentation

## 2019-03-25 DIAGNOSIS — Z87891 Personal history of nicotine dependence: Secondary | ICD-10-CM | POA: Diagnosis not present

## 2019-03-25 DIAGNOSIS — R011 Cardiac murmur, unspecified: Secondary | ICD-10-CM | POA: Diagnosis not present

## 2019-03-25 DIAGNOSIS — Z170 Estrogen receptor positive status [ER+]: Secondary | ICD-10-CM | POA: Insufficient documentation

## 2019-03-25 DIAGNOSIS — D0512 Intraductal carcinoma in situ of left breast: Secondary | ICD-10-CM | POA: Diagnosis not present

## 2019-03-25 DIAGNOSIS — F329 Major depressive disorder, single episode, unspecified: Secondary | ICD-10-CM | POA: Insufficient documentation

## 2019-03-25 DIAGNOSIS — M8580 Other specified disorders of bone density and structure, unspecified site: Secondary | ICD-10-CM | POA: Diagnosis not present

## 2019-03-25 DIAGNOSIS — Z803 Family history of malignant neoplasm of breast: Secondary | ICD-10-CM | POA: Insufficient documentation

## 2019-03-25 DIAGNOSIS — Z923 Personal history of irradiation: Secondary | ICD-10-CM | POA: Diagnosis not present

## 2019-03-25 DIAGNOSIS — Z20828 Contact with and (suspected) exposure to other viral communicable diseases: Secondary | ICD-10-CM | POA: Diagnosis not present

## 2019-03-25 NOTE — Consult Note (Signed)
NEW PATIENT EVALUATION  Name: Erin Hinton  MRN: [id number]  Date:   03/25/2019     DOB: 1946/12/03   This 73 y.o. 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  REFERRING PHYSICIAN: Tower, Wynelle Fanny, MD  CHIEF COMPLAINT:  Chief Complaint  Patient presents with  . Breast Cancer    Initial consultation of breast cancer     DIAGNOSIS: The encounter diagnosis was Ductal carcinoma in situ (DCIS) of left breast.   PREVIOUS INVESTIGATIONS:  Mammogram and ultrasound reviewed Clinical notes reviewed Pathology reports reviewed  HPI: Patient is a 73 year old female who has a past history almost 20 years prior of ductal carcinoma in situ of the right breast status post wide local excision alone with no adjuvant treatment.  She recently presented back in January on digital diagnostic mammograms to have a group of fine pleomorphic calcifications in the inferior aspect of the breast spanning an area of 2.2 cm.  These were felt to be suspicious and biopsy was recommended.  This area was confirmed on ultrasound.  Biopsy was positive for ductal carcinoma in situ.  Patient underwent wide local excision showing high-grade ductal carcinoma in situ with comedonecrosis.  Tumor extended least 11 mm.  Margins were clear at 1 cm.  No lymph nodes were submitted.  Patient is done well postoperatively.  Her tumor was ER positive.  She is seen today for radiation oncology opinion.  No complaints at this time she specifically denies breast tenderness cough or bone pain.  PLANNED TREATMENT REGIMEN: Left whole breast radiation hypofractionated  PAST MEDICAL HISTORY:  has a past medical history of Anxiety, Breast cancer ([place]) (1996), Cancer ([place]), DCIS (ductal carcinoma in situ) (03/02/2019), Depression, Heart murmur, Hyperlipidemia, Hyperlipidemia, Hypertension, Osteopenia, and Shortness  of breath dyspnea.    PAST SURGICAL HISTORY:  Past Surgical History:  Procedure Laterality Date  . BREAST BIOPSY Left 01/27/2019   Affirm Bx- Coil Clip- HIGH-GRADE DUCTAL CARCINOMA IN SITU, WITH COMEDONECROSIS.  Marland Kitchen BREAST EXCISIONAL BIOPSY Right 01/2002   neg. no lymph node involvement  . BREAST EXCISIONAL BIOPSY Left 03/02/2019   lumpectomy DCIS  . BREAST LUMPECTOMY Left 03/02/2019   left lumpectomy dcis with NL  . BREAST LUMPECTOMY Left 03/02/2019   Procedure: BREAST WIDE EXCISION LEFT, WITH WIRE LOCATION;  Surgeon: Robert Bellow, MD;  Location: [facility] ORS;  Service: General;  Laterality: Left;  . 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 CATARACT EXTRACTION W/PHACO Right 05/01/2016   Procedure: CATARACT EXTRACTION PHACO AND INTRAOCULAR LENS PLACEMENT (IOC);  Surgeon: Birder Robson, MD;  Location: [facility] ORS;  Service: Ophthalmology;  Laterality: Right;  [place] 00:53 AP% 21.7 CDE 11.50 fluid pack lot # [id number]  . CATARACT EXTRACTION W/PHACO Left 06/05/2016   Procedure: CATARACT EXTRACTION PHACO AND INTRAOCULAR LENS PLACEMENT ([place]);  Surgeon: Birder Robson, MD;  Location: [facility] ORS;  Service: Ophthalmology;  Laterality: Left;  [place] 01:04 AP% 22.2 CDE 14.34 fluid pack lot # [id number]  . COLONOSCOPY WITH PROPOFOL N/A 11/21/2015   Procedure: COLONOSCOPY WITH PROPOFOL;  Surgeon: Hulen Luster, MD;  Location: [facility] ENDOSCOPY;  Service: Gastroenterology;  Laterality: N/A;    FAMILY HISTORY: family history includes Alcohol abuse in her brother; Breast cancer (age of onset: 73) in her daughter; Breast cancer (age of onset: 68) in her paternal aunt; Breast cancer (age of onset: 41) in  her sister; Heart attack in her sister; Heart disease in her father, sister, and sister; Hypertension in her father; Lung cancer in her brother and sister; Lymphoma in her father; Ovarian cancer in her mother.  SOCIAL HISTORY:  reports that she quit smoking about 3 years ago. Her smoking use included cigarettes and  e-cigarettes. She has a 22.50 pack-year smoking history. She has never used smokeless tobacco. She reports that she does not drink alcohol or use drugs.  ALLERGIES: Alendronate sodium; Atorvastatin; Crestor [rosuvastatin calcium]; Evista [raloxifene]; Sertraline hcl; and Pseudoephedrine  MEDICATIONS:  Current Outpatient Medications  Medication Sig Dispense Refill  . acetaminophen (TYLENOL) 500 MG tablet Take 500 mg by mouth every 6 (six) hours as needed (for pain.).    Marland Kitchen Ascorbic Acid (VITAMIN C WITH ROSE HIPS) 1000 MG tablet Take 1,000 mg by mouth daily.    . Calcium Carb-Cholecalciferol (CALCIUM 600 + D PO) Take 1 capsule by mouth daily.    . Calcium-Magnesium-Zinc (CAL-MAG-ZINC PO) Take 1 tablet by mouth daily.    . Cholecalciferol (VITAMIN D) 50 MCG (2000 UT) tablet Take 2,000 Units by mouth daily.    . Coconut Oil 1000 MG CAPS Take 1,000 mg by mouth daily.    Marland Kitchen escitalopram (LEXAPRO) 20 MG tablet Take 1 tablet (20 mg total) by mouth daily. 90 tablet 3  . fluticasone (FLONASE) 50 MCG/ACT nasal spray Place 2 sprays into both nostrils daily. (Patient taking differently: Place 2 sprays into both nostrils daily as needed for allergies. ) 48 g 3  . fluticasone (FLONASE) 50 MCG/ACT nasal spray Place 2 sprays into both nostrils daily as needed for allergies or rhinitis.    . hydrochlorothiazide (HYDRODIURIL) 25 MG tablet Take 1 tablet (25 mg total) by mouth daily. 90 tablet 3  . Omega-3 Fatty Acids (FISH OIL) 1000 MG CAPS Take 1,000 mg by mouth daily.     No current facility-administered medications for this encounter.     ECOG PERFORMANCE STATUS:  0 - Asymptomatic  REVIEW OF SYSTEMS: Patient denies any weight loss, fatigue, weakness, fever, chills or night sweats. Patient denies any loss of vision, blurred vision. Patient denies any ringing  of the ears or hearing loss. No irregular heartbeat. Patient denies heart murmur or history of fainting. Patient denies any chest pain or pain radiating  to her upper extremities. Patient denies any shortness of breath, difficulty breathing at night, cough or hemoptysis. Patient denies any swelling in the lower legs. Patient denies any nausea vomiting, vomiting of blood, or coffee ground material in the vomitus. Patient denies any stomach pain. Patient states has had normal bowel movements no significant constipation or diarrhea. Patient denies any dysuria, hematuria or significant nocturia. Patient denies any problems walking, swelling in the joints or loss of balance. Patient denies any skin changes, loss of hair or loss of weight. Patient denies any excessive worrying or anxiety or significant depression. Patient denies any problems with insomnia. Patient denies excessive thirst, polyuria, polydipsia. Patient denies any swollen glands, patient denies easy bruising or easy bleeding. Patient denies any recent infections, allergies or URI. Patient "s visual fields have not changed significantly in recent time.   PHYSICAL EXAM: BP (!) 189/92 (BP Location: Left Arm, Patient Position: Sitting)   Pulse 69   Temp (!) 97.2 F (36.2 C) (Tympanic)   Resp 18   Wt 182 lb 12.2 oz (82.9 kg)   BMI 29.50 kg/m  Left breast is wide local excision scar around the nipple areole complex which is  well-healed.  Cosmetic result is excellent.  No dominant mass or nodularity is noted in either breast in 2 positions examined.  No axillary or supraclavicular adenopathy is identified.  Well-developed well-nourished patient in NAD. HEENT reveals PERLA, EOMI, discs not visualized.  Oral cavity is clear. No oral mucosal lesions are identified. Neck is clear without evidence of cervical or supraclavicular adenopathy. Lungs are clear to A&P. Cardiac examination is essentially unremarkable with regular rate and rhythm without murmur rub or thrill. Abdomen is benign with no organomegaly or masses noted. Motor sensory and DTR levels are equal and symmetric in the upper and lower  extremities. Cranial nerves II through XII are grossly intact. Proprioception is intact. No peripheral adenopathy or edema is identified. No motor or sensory levels are noted. Crude visual fields are within normal range.  LABORATORY DATA: Pathology report reviewed    RADIOLOGY RESULTS: Mammogram and ultrasound reviewed and compatible with above-stated findings   IMPRESSION: Stage 0 ductal carcinoma in situ high-grade ER positive status post wide local excision of the left breast and 73 year old female  PLAN: At this time a believe patient would be a good candidate for hypofractionated course of radiation to her left breast over 3 weeks.  Would also boost her scar another 1400 cGy using electron-beam.  Risks and benefits of treatment including skin reaction fatigue alteration of blood counts possible occlusion of superficial lung or were discussed in detail with the patient.  She seems to comprehend my treatment plan well.  I have personally set up and ordered CT simulation for early next week to allow for some for further healing.  Patient also will be a candidate for antiestrogen therapy after completion of radiation.  I would like to take this opportunity to thank you for allowing me to participate in the care of your patient.Noreene Filbert, MD

## 2019-03-30 ENCOUNTER — Other Ambulatory Visit: Payer: Self-pay

## 2019-03-31 ENCOUNTER — Other Ambulatory Visit: Payer: Self-pay

## 2019-03-31 ENCOUNTER — Ambulatory Visit
Admission: RE | Admit: 2019-03-31 | Discharge: 2019-03-31 | Disposition: A | Discharge status: Home / Self Care | Payer: PPO | Source: Ambulatory Visit | Attending: Radiation Oncology | Admitting: Radiation Oncology

## 2019-03-31 DIAGNOSIS — D0512 Intraductal carcinoma in situ of left breast: Secondary | ICD-10-CM | POA: Insufficient documentation

## 2019-03-31 DIAGNOSIS — I10 Essential (primary) hypertension: Secondary | ICD-10-CM | POA: Diagnosis not present

## 2019-03-31 DIAGNOSIS — Z87891 Personal history of nicotine dependence: Secondary | ICD-10-CM | POA: Insufficient documentation

## 2019-03-31 DIAGNOSIS — Z510 Encounter for antineoplastic radiation therapy: Secondary | ICD-10-CM | POA: Insufficient documentation

## 2019-03-31 DIAGNOSIS — Z8041 Family history of malignant neoplasm of ovary: Secondary | ICD-10-CM | POA: Diagnosis not present

## 2019-03-31 DIAGNOSIS — F419 Anxiety disorder, unspecified: Secondary | ICD-10-CM | POA: Insufficient documentation

## 2019-03-31 DIAGNOSIS — F329 Major depressive disorder, single episode, unspecified: Secondary | ICD-10-CM | POA: Diagnosis not present

## 2019-03-31 DIAGNOSIS — Z801 Family history of malignant neoplasm of trachea, bronchus and lung: Secondary | ICD-10-CM | POA: Diagnosis not present

## 2019-03-31 DIAGNOSIS — Z170 Estrogen receptor positive status [ER+]: Secondary | ICD-10-CM | POA: Insufficient documentation

## 2019-03-31 DIAGNOSIS — Z803 Family history of malignant neoplasm of breast: Secondary | ICD-10-CM | POA: Insufficient documentation

## 2019-04-01 DIAGNOSIS — D0512 Intraductal carcinoma in situ of left breast: Secondary | ICD-10-CM | POA: Diagnosis not present

## 2019-04-01 DIAGNOSIS — Z510 Encounter for antineoplastic radiation therapy: Secondary | ICD-10-CM | POA: Diagnosis not present

## 2019-04-01 DIAGNOSIS — Z170 Estrogen receptor positive status [ER+]: Secondary | ICD-10-CM | POA: Diagnosis not present

## 2019-04-03 ENCOUNTER — Other Ambulatory Visit: Payer: Self-pay | Admitting: *Deleted

## 2019-04-03 DIAGNOSIS — D0512 Intraductal carcinoma in situ of left breast: Secondary | ICD-10-CM

## 2019-04-06 ENCOUNTER — Other Ambulatory Visit: Payer: Self-pay

## 2019-04-07 ENCOUNTER — Ambulatory Visit
Admission: RE | Admit: 2019-04-07 | Discharge: 2019-04-07 | Disposition: A | Discharge status: Home / Self Care | Payer: PPO | Source: Ambulatory Visit | Attending: Radiation Oncology | Admitting: Radiation Oncology

## 2019-04-07 ENCOUNTER — Other Ambulatory Visit: Payer: Self-pay

## 2019-04-07 DIAGNOSIS — Z510 Encounter for antineoplastic radiation therapy: Secondary | ICD-10-CM | POA: Diagnosis not present

## 2019-04-07 DIAGNOSIS — D0512 Intraductal carcinoma in situ of left breast: Secondary | ICD-10-CM | POA: Diagnosis not present

## 2019-04-07 DIAGNOSIS — Z170 Estrogen receptor positive status [ER+]: Secondary | ICD-10-CM | POA: Diagnosis not present

## 2019-04-08 ENCOUNTER — Ambulatory Visit
Admission: RE | Admit: 2019-04-08 | Discharge: 2019-04-08 | Disposition: A | Discharge status: Home / Self Care | Payer: PPO | Source: Ambulatory Visit | Attending: Radiation Oncology | Admitting: Radiation Oncology

## 2019-04-08 ENCOUNTER — Other Ambulatory Visit: Payer: Self-pay

## 2019-04-08 DIAGNOSIS — D0512 Intraductal carcinoma in situ of left breast: Secondary | ICD-10-CM | POA: Diagnosis not present

## 2019-04-08 DIAGNOSIS — Z170 Estrogen receptor positive status [ER+]: Secondary | ICD-10-CM | POA: Diagnosis not present

## 2019-04-08 DIAGNOSIS — Z510 Encounter for antineoplastic radiation therapy: Secondary | ICD-10-CM | POA: Diagnosis not present

## 2019-04-09 ENCOUNTER — Other Ambulatory Visit: Payer: Self-pay

## 2019-04-09 ENCOUNTER — Ambulatory Visit
Admission: RE | Admit: 2019-04-09 | Discharge: 2019-04-09 | Disposition: A | Discharge status: Home / Self Care | Payer: PPO | Source: Ambulatory Visit | Attending: Radiation Oncology | Admitting: Radiation Oncology

## 2019-04-09 DIAGNOSIS — Z170 Estrogen receptor positive status [ER+]: Secondary | ICD-10-CM | POA: Diagnosis not present

## 2019-04-09 DIAGNOSIS — Z510 Encounter for antineoplastic radiation therapy: Secondary | ICD-10-CM | POA: Diagnosis not present

## 2019-04-09 DIAGNOSIS — D0512 Intraductal carcinoma in situ of left breast: Secondary | ICD-10-CM | POA: Diagnosis not present

## 2019-04-10 ENCOUNTER — Ambulatory Visit
Admission: RE | Admit: 2019-04-10 | Discharge: 2019-04-10 | Disposition: A | Discharge status: Home / Self Care | Payer: PPO | Source: Ambulatory Visit | Attending: Radiation Oncology | Admitting: Radiation Oncology

## 2019-04-10 ENCOUNTER — Other Ambulatory Visit: Payer: Self-pay

## 2019-04-10 DIAGNOSIS — Z170 Estrogen receptor positive status [ER+]: Secondary | ICD-10-CM | POA: Diagnosis not present

## 2019-04-10 DIAGNOSIS — Z510 Encounter for antineoplastic radiation therapy: Secondary | ICD-10-CM | POA: Diagnosis not present

## 2019-04-10 DIAGNOSIS — D0512 Intraductal carcinoma in situ of left breast: Secondary | ICD-10-CM | POA: Diagnosis not present

## 2019-04-13 ENCOUNTER — Ambulatory Visit
Admission: RE | Admit: 2019-04-13 | Discharge: 2019-04-13 | Disposition: A | Discharge status: Home / Self Care | Payer: PPO | Source: Ambulatory Visit | Attending: Radiation Oncology | Admitting: Radiation Oncology

## 2019-04-13 ENCOUNTER — Other Ambulatory Visit: Payer: Self-pay

## 2019-04-13 DIAGNOSIS — Z170 Estrogen receptor positive status [ER+]: Secondary | ICD-10-CM | POA: Diagnosis not present

## 2019-04-13 DIAGNOSIS — Z510 Encounter for antineoplastic radiation therapy: Secondary | ICD-10-CM | POA: Diagnosis not present

## 2019-04-13 DIAGNOSIS — D0512 Intraductal carcinoma in situ of left breast: Secondary | ICD-10-CM | POA: Diagnosis not present

## 2019-04-14 ENCOUNTER — Ambulatory Visit
Admission: RE | Admit: 2019-04-14 | Discharge: 2019-04-14 | Disposition: A | Discharge status: Home / Self Care | Payer: PPO | Source: Ambulatory Visit | Attending: Radiation Oncology | Admitting: Radiation Oncology

## 2019-04-14 ENCOUNTER — Other Ambulatory Visit: Payer: Self-pay

## 2019-04-14 ENCOUNTER — Ambulatory Visit (INDEPENDENT_AMBULATORY_CARE_PROVIDER_SITE_OTHER): Payer: PPO | Admitting: General Surgery

## 2019-04-14 ENCOUNTER — Ambulatory Visit: Payer: Self-pay | Admitting: General Surgery

## 2019-04-14 ENCOUNTER — Encounter: Payer: Self-pay | Admitting: General Surgery

## 2019-04-14 VITALS — BP 142/83 | HR 66 | Temp 98.1°F | Resp 14 | Ht 66.0 in | Wt 183.0 lb

## 2019-04-14 DIAGNOSIS — Z510 Encounter for antineoplastic radiation therapy: Secondary | ICD-10-CM | POA: Diagnosis not present

## 2019-04-14 DIAGNOSIS — D0512 Intraductal carcinoma in situ of left breast: Secondary | ICD-10-CM

## 2019-04-14 DIAGNOSIS — Z170 Estrogen receptor positive status [ER+]: Secondary | ICD-10-CM | POA: Diagnosis not present

## 2019-04-14 NOTE — Progress Notes (Signed)
Patient ID: Erin Hinton, female   DOB: [birth year], 73 y.o.   MRN: [id number]  Chief Complaint  Patient presents with  . Routine Post Op    HPI Erin Hinton is a 73 y.o. female here for a post op from a left breast lumpectomy. She had her radiation therapy today and she reports that she is sore. She has had 5 treatments.  HPI  Past Medical History:  Diagnosis Date  . Anxiety   . Breast cancer [facility]) 1996   Right breast, Dr Faylene Million, DCIS, lumpectomy only  . Cancer ([place])    skin  . 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 ER positive: 51-90%  . Depression   . Heart murmur    in her 30's and it is not a problem now  . Hyperlipidemia   . Hyperlipidemia   . Hypertension   . Osteopenia   . Shortness of breath dyspnea    due to excercise only    Past Surgical History:  Procedure Laterality Date  . BREAST BIOPSY Left 01/27/2019   Affirm Bx- Coil Clip- HIGH-GRADE DUCTAL CARCINOMA IN SITU, WITH COMEDONECROSIS.  Marland Kitchen BREAST EXCISIONAL BIOPSY Right 01/2002   neg. no lymph node involvement  . BREAST EXCISIONAL BIOPSY Left 03/02/2019   lumpectomy DCIS  . BREAST LUMPECTOMY Left 03/02/2019   left lumpectomy dcis with NL  . BREAST LUMPECTOMY Left 03/02/2019   Procedure: BREAST WIDE EXCISION LEFT, WITH WIRE LOCATION;  Surgeon: Robert Bellow, MD;  Location: [facility] ORS;  Service: General;  Laterality: Left;  . 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 CATARACT EXTRACTION W/PHACO Right 05/01/2016   Procedure: CATARACT EXTRACTION PHACO AND INTRAOCULAR LENS PLACEMENT (IOC);  Surgeon: Birder Robson, MD;  Location: [facility] ORS;  Service: Ophthalmology;  Laterality: Right;  [place] 00:53 AP% 21.7 CDE 11.50 fluid pack lot # [id number]  . CATARACT EXTRACTION W/PHACO Left 06/05/2016   Procedure: CATARACT EXTRACTION PHACO AND INTRAOCULAR LENS PLACEMENT ([place]);  Surgeon: Birder Robson, MD;  Location: [facility] ORS;  Service: Ophthalmology;   Laterality: Left;  [place] 01:04 AP% 22.2 CDE 14.34 fluid pack lot # [id number]  . COLONOSCOPY WITH PROPOFOL N/A 11/21/2015   Procedure: COLONOSCOPY WITH PROPOFOL;  Surgeon: Hulen Luster, MD;  Location: [facility] ENDOSCOPY;  Service: Gastroenterology;  Laterality: N/A;    Family History  Problem Relation Age of Onset  . Ovarian cancer Mother   . Heart disease Sister   . Lung cancer Sister   . Lymphoma Father   . Heart disease Father        MI  . Hypertension Father   . Alcohol abuse Brother   . Lung cancer Brother   . Breast cancer Sister 23  . Heart disease Sister   . Heart attack Sister   . Breast cancer Daughter [address]       Theophilus Kinds, LCIS  . Breast cancer Paternal Aunt 63  . Colon cancer Neg Hx     Social History Social History   Tobacco Use  . Smoking status: Former Smoker    Packs/day: 0.50    Years: 45.00    Pack years: 22.50    Types: Cigarettes, E-cigarettes    Last attempt to quit: 01/22/2016    Years since quitting: 3.2  . Smokeless tobacco: Never Used  . Tobacco comment: also uses e-cigaretts on occasion  Substance Use Topics  . Alcohol use: No    Alcohol/week: 0.0 standard  drinks  . Drug use: No    Allergies  Allergen Reactions  . Alendronate Sodium     GI pain  . Atorvastatin     Muscle pain  . Crestor [Rosuvastatin Calcium]     Muscle pain  . Evista [Raloxifene] Other (See Comments)    Breast tenderness  . Sertraline Hcl     Did not help, made anxiety worse  . Pseudoephedrine Palpitations    Current Outpatient Medications  Medication Sig Dispense Refill  . acetaminophen (TYLENOL) 500 MG tablet Take 500 mg by mouth every 6 (six) hours as needed (for pain.).    Marland Kitchen Ascorbic Acid (VITAMIN C WITH ROSE HIPS) 1000 MG tablet Take 1,000 mg by mouth daily.    . Calcium Carb-Cholecalciferol (CALCIUM 600 + D PO) Take 1 capsule by mouth daily.    . Calcium-Magnesium-Zinc (CAL-MAG-ZINC PO) Take 1 tablet by mouth daily.    . Cholecalciferol (VITAMIN D) 50 MCG (2000 UT)  tablet Take 2,000 Units by mouth daily.    . Coconut Oil 1000 MG CAPS Take 1,000 mg by mouth daily.    Marland Kitchen escitalopram (LEXAPRO) 20 MG tablet Take 1 tablet (20 mg total) by mouth daily. 90 tablet 3  . fluticasone (FLONASE) 50 MCG/ACT nasal spray Place 2 sprays into both nostrils daily. (Patient taking differently: Place 2 sprays into both nostrils daily as needed for allergies. ) 48 g 3  . fluticasone (FLONASE) 50 MCG/ACT nasal spray Place 2 sprays into both nostrils daily as needed for allergies or rhinitis.    . hydrochlorothiazide (HYDRODIURIL) 25 MG tablet Take 1 tablet (25 mg total) by mouth daily. 90 tablet 3  . Omega-3 Fatty Acids (FISH OIL) 1000 MG CAPS Take 1,000 mg by mouth daily.     No current facility-administered medications for this visit.     Review of Systems Review of Systems  Constitutional: Negative.   Respiratory: Negative.   Cardiovascular: Negative.     Blood pressure (!) 142/83, pulse 66, temperature 98.1 F (36.7 C), resp. rate 14, height 5\' 6"  (1.676 m), weight 183 lb (83 kg), SpO2 97 %.  Physical Exam Physical Exam Exam conducted with a chaperone present.  Constitutional:      Appearance: She is obese.  Chest:     Breasts:        Left: No inverted nipple, mass, nipple discharge, skin change or tenderness.    Lymphadenopathy:     Upper Body:     Right upper body: No supraclavicular or axillary adenopathy.     Left upper body: No supraclavicular or axillary adenopathy.  Skin:    General: Skin is dry.  Neurological:     General: No focal deficit present.     Mental Status: She is alert and oriented to person, place, and time.     Data Reviewed Patient tolerating XRT well.   Assessment Doing well post wide excision.   Plan Return in 5 weeks.The patient is aware to call back for any questions or new concerns. The role of post RT anti-estrogen reviewed.    HPI, Physical Exam, Assessment and Plan have been scribed under the direction and in the  presence of Robert Bellow, MD  Concepcion Living, LPN Gaspar Cola CMA   I have completed the exam and reviewed the above documentation for accuracy and completeness.  I agree with the above.  Haematologist has been used and any errors in dictation or transcription are unintentional.  Hervey Ard, M.D., F.A.C.S.  Dellis Filbert  W Jarett Dralle 04/16/2019, 3:15 PM

## 2019-04-15 ENCOUNTER — Ambulatory Visit
Admission: RE | Admit: 2019-04-15 | Discharge: 2019-04-15 | Disposition: A | Discharge status: Home / Self Care | Payer: PPO | Source: Ambulatory Visit | Attending: Radiation Oncology | Admitting: Radiation Oncology

## 2019-04-15 ENCOUNTER — Other Ambulatory Visit: Payer: Self-pay

## 2019-04-15 DIAGNOSIS — D0512 Intraductal carcinoma in situ of left breast: Secondary | ICD-10-CM | POA: Diagnosis not present

## 2019-04-15 DIAGNOSIS — Z510 Encounter for antineoplastic radiation therapy: Secondary | ICD-10-CM | POA: Diagnosis not present

## 2019-04-15 DIAGNOSIS — Z170 Estrogen receptor positive status [ER+]: Secondary | ICD-10-CM | POA: Diagnosis not present

## 2019-04-16 ENCOUNTER — Ambulatory Visit
Admission: RE | Admit: 2019-04-16 | Discharge: 2019-04-16 | Disposition: A | Discharge status: Home / Self Care | Payer: PPO | Source: Ambulatory Visit | Attending: Radiation Oncology | Admitting: Radiation Oncology

## 2019-04-16 ENCOUNTER — Other Ambulatory Visit: Payer: Self-pay

## 2019-04-16 DIAGNOSIS — Z510 Encounter for antineoplastic radiation therapy: Secondary | ICD-10-CM | POA: Diagnosis not present

## 2019-04-16 DIAGNOSIS — D0512 Intraductal carcinoma in situ of left breast: Secondary | ICD-10-CM | POA: Diagnosis not present

## 2019-04-16 DIAGNOSIS — Z170 Estrogen receptor positive status [ER+]: Secondary | ICD-10-CM | POA: Diagnosis not present

## 2019-04-17 ENCOUNTER — Ambulatory Visit
Admission: RE | Admit: 2019-04-17 | Discharge: 2019-04-17 | Disposition: A | Discharge status: Home / Self Care | Payer: PPO | Source: Ambulatory Visit | Attending: Radiation Oncology | Admitting: Radiation Oncology

## 2019-04-17 ENCOUNTER — Other Ambulatory Visit: Payer: Self-pay

## 2019-04-17 DIAGNOSIS — D0512 Intraductal carcinoma in situ of left breast: Secondary | ICD-10-CM | POA: Insufficient documentation

## 2019-04-17 DIAGNOSIS — Z8041 Family history of malignant neoplasm of ovary: Secondary | ICD-10-CM | POA: Diagnosis not present

## 2019-04-17 DIAGNOSIS — Z801 Family history of malignant neoplasm of trachea, bronchus and lung: Secondary | ICD-10-CM | POA: Insufficient documentation

## 2019-04-17 DIAGNOSIS — Z87891 Personal history of nicotine dependence: Secondary | ICD-10-CM | POA: Diagnosis not present

## 2019-04-17 DIAGNOSIS — Z803 Family history of malignant neoplasm of breast: Secondary | ICD-10-CM | POA: Insufficient documentation

## 2019-04-17 DIAGNOSIS — Z170 Estrogen receptor positive status [ER+]: Secondary | ICD-10-CM | POA: Insufficient documentation

## 2019-04-17 DIAGNOSIS — F329 Major depressive disorder, single episode, unspecified: Secondary | ICD-10-CM | POA: Diagnosis not present

## 2019-04-17 DIAGNOSIS — Z510 Encounter for antineoplastic radiation therapy: Secondary | ICD-10-CM | POA: Diagnosis not present

## 2019-04-17 DIAGNOSIS — F419 Anxiety disorder, unspecified: Secondary | ICD-10-CM | POA: Diagnosis not present

## 2019-04-17 DIAGNOSIS — I10 Essential (primary) hypertension: Secondary | ICD-10-CM | POA: Insufficient documentation

## 2019-04-20 ENCOUNTER — Ambulatory Visit
Admission: RE | Admit: 2019-04-20 | Discharge: 2019-04-20 | Disposition: A | Discharge status: Home / Self Care | Payer: PPO | Source: Ambulatory Visit | Attending: Radiation Oncology | Admitting: Radiation Oncology

## 2019-04-20 ENCOUNTER — Other Ambulatory Visit: Payer: Self-pay

## 2019-04-20 DIAGNOSIS — D0512 Intraductal carcinoma in situ of left breast: Secondary | ICD-10-CM | POA: Diagnosis not present

## 2019-04-20 DIAGNOSIS — Z170 Estrogen receptor positive status [ER+]: Secondary | ICD-10-CM | POA: Diagnosis not present

## 2019-04-20 DIAGNOSIS — Z510 Encounter for antineoplastic radiation therapy: Secondary | ICD-10-CM | POA: Diagnosis not present

## 2019-04-21 ENCOUNTER — Ambulatory Visit
Admission: RE | Admit: 2019-04-21 | Discharge: 2019-04-21 | Disposition: A | Discharge status: Home / Self Care | Payer: PPO | Source: Ambulatory Visit | Attending: Radiation Oncology | Admitting: Radiation Oncology

## 2019-04-21 ENCOUNTER — Other Ambulatory Visit: Payer: Self-pay

## 2019-04-21 DIAGNOSIS — Z170 Estrogen receptor positive status [ER+]: Secondary | ICD-10-CM | POA: Diagnosis not present

## 2019-04-21 DIAGNOSIS — D0512 Intraductal carcinoma in situ of left breast: Secondary | ICD-10-CM | POA: Diagnosis not present

## 2019-04-21 DIAGNOSIS — Z510 Encounter for antineoplastic radiation therapy: Secondary | ICD-10-CM | POA: Diagnosis not present

## 2019-04-22 ENCOUNTER — Other Ambulatory Visit: Payer: Self-pay

## 2019-04-22 ENCOUNTER — Inpatient Hospital Stay: Payer: PPO | Attending: Radiation Oncology

## 2019-04-22 ENCOUNTER — Ambulatory Visit
Admission: RE | Admit: 2019-04-22 | Discharge: 2019-04-22 | Disposition: A | Discharge status: Home / Self Care | Payer: PPO | Source: Ambulatory Visit | Attending: Radiation Oncology | Admitting: Radiation Oncology

## 2019-04-22 ENCOUNTER — Ambulatory Visit: Payer: PPO

## 2019-04-22 DIAGNOSIS — Z86000 Personal history of in-situ neoplasm of breast: Secondary | ICD-10-CM | POA: Insufficient documentation

## 2019-04-22 DIAGNOSIS — D0592 Unspecified type of carcinoma in situ of left breast: Secondary | ICD-10-CM | POA: Insufficient documentation

## 2019-04-22 DIAGNOSIS — D0512 Intraductal carcinoma in situ of left breast: Secondary | ICD-10-CM

## 2019-04-22 DIAGNOSIS — Z170 Estrogen receptor positive status [ER+]: Secondary | ICD-10-CM | POA: Diagnosis not present

## 2019-04-22 DIAGNOSIS — Z510 Encounter for antineoplastic radiation therapy: Secondary | ICD-10-CM | POA: Diagnosis not present

## 2019-04-22 LAB — CBC
HCT: 39 % (ref 36.0–46.0)
Hemoglobin: 12.8 g/dL (ref 12.0–15.0)
MCH: 31.4 pg (ref 26.0–34.0)
MCHC: 32.8 g/dL (ref 30.0–36.0)
MCV: 95.6 fL (ref 80.0–100.0)
Platelets: 236 10*3/uL (ref 150–400)
RBC: 4.08 MIL/uL (ref 3.87–5.11)
RDW: 14.9 % (ref 11.5–15.5)
WBC: 5.6 10*3/uL (ref 4.0–10.5)
nRBC: 0 % (ref 0.0–0.2)

## 2019-04-23 ENCOUNTER — Ambulatory Visit
Admission: RE | Admit: 2019-04-23 | Discharge: 2019-04-23 | Disposition: A | Discharge status: Home / Self Care | Payer: PPO | Source: Ambulatory Visit | Attending: Radiation Oncology | Admitting: Radiation Oncology

## 2019-04-23 ENCOUNTER — Other Ambulatory Visit: Payer: Self-pay

## 2019-04-23 DIAGNOSIS — Z510 Encounter for antineoplastic radiation therapy: Secondary | ICD-10-CM | POA: Diagnosis not present

## 2019-04-23 DIAGNOSIS — Z170 Estrogen receptor positive status [ER+]: Secondary | ICD-10-CM | POA: Diagnosis not present

## 2019-04-23 DIAGNOSIS — D0512 Intraductal carcinoma in situ of left breast: Secondary | ICD-10-CM | POA: Diagnosis not present

## 2019-04-24 ENCOUNTER — Other Ambulatory Visit: Payer: Self-pay

## 2019-04-24 ENCOUNTER — Ambulatory Visit
Admission: RE | Admit: 2019-04-24 | Discharge: 2019-04-24 | Disposition: A | Discharge status: Home / Self Care | Payer: PPO | Source: Ambulatory Visit | Attending: Radiation Oncology | Admitting: Radiation Oncology

## 2019-04-24 DIAGNOSIS — Z170 Estrogen receptor positive status [ER+]: Secondary | ICD-10-CM | POA: Diagnosis not present

## 2019-04-24 DIAGNOSIS — Z510 Encounter for antineoplastic radiation therapy: Secondary | ICD-10-CM | POA: Diagnosis not present

## 2019-04-24 DIAGNOSIS — D0512 Intraductal carcinoma in situ of left breast: Secondary | ICD-10-CM | POA: Diagnosis not present

## 2019-04-27 ENCOUNTER — Ambulatory Visit
Admission: RE | Admit: 2019-04-27 | Discharge: 2019-04-27 | Disposition: A | Discharge status: Home / Self Care | Payer: PPO | Source: Ambulatory Visit | Attending: Radiation Oncology | Admitting: Radiation Oncology

## 2019-04-27 ENCOUNTER — Other Ambulatory Visit: Payer: Self-pay

## 2019-04-27 DIAGNOSIS — D0512 Intraductal carcinoma in situ of left breast: Secondary | ICD-10-CM | POA: Diagnosis not present

## 2019-04-27 DIAGNOSIS — Z510 Encounter for antineoplastic radiation therapy: Secondary | ICD-10-CM | POA: Diagnosis not present

## 2019-04-27 DIAGNOSIS — Z170 Estrogen receptor positive status [ER+]: Secondary | ICD-10-CM | POA: Diagnosis not present

## 2019-04-28 ENCOUNTER — Ambulatory Visit
Admission: RE | Admit: 2019-04-28 | Discharge: 2019-04-28 | Disposition: A | Discharge status: Home / Self Care | Payer: PPO | Source: Ambulatory Visit | Attending: Radiation Oncology | Admitting: Radiation Oncology

## 2019-04-28 ENCOUNTER — Other Ambulatory Visit: Payer: Self-pay

## 2019-04-28 DIAGNOSIS — Z510 Encounter for antineoplastic radiation therapy: Secondary | ICD-10-CM | POA: Diagnosis not present

## 2019-04-28 DIAGNOSIS — D0512 Intraductal carcinoma in situ of left breast: Secondary | ICD-10-CM | POA: Diagnosis not present

## 2019-04-28 DIAGNOSIS — Z170 Estrogen receptor positive status [ER+]: Secondary | ICD-10-CM | POA: Diagnosis not present

## 2019-04-29 ENCOUNTER — Other Ambulatory Visit: Payer: Self-pay

## 2019-04-29 ENCOUNTER — Ambulatory Visit
Admission: RE | Admit: 2019-04-29 | Discharge: 2019-04-29 | Disposition: A | Discharge status: Home / Self Care | Payer: PPO | Source: Ambulatory Visit | Attending: Radiation Oncology | Admitting: Radiation Oncology

## 2019-04-29 DIAGNOSIS — D0512 Intraductal carcinoma in situ of left breast: Secondary | ICD-10-CM | POA: Diagnosis not present

## 2019-04-29 DIAGNOSIS — Z510 Encounter for antineoplastic radiation therapy: Secondary | ICD-10-CM | POA: Diagnosis not present

## 2019-04-29 DIAGNOSIS — Z170 Estrogen receptor positive status [ER+]: Secondary | ICD-10-CM | POA: Diagnosis not present

## 2019-04-30 ENCOUNTER — Other Ambulatory Visit: Payer: Self-pay

## 2019-04-30 ENCOUNTER — Ambulatory Visit
Admission: RE | Admit: 2019-04-30 | Discharge: 2019-04-30 | Disposition: A | Discharge status: Home / Self Care | Payer: PPO | Source: Ambulatory Visit | Attending: Radiation Oncology | Admitting: Radiation Oncology

## 2019-04-30 DIAGNOSIS — Z510 Encounter for antineoplastic radiation therapy: Secondary | ICD-10-CM | POA: Diagnosis not present

## 2019-05-01 ENCOUNTER — Ambulatory Visit
Admission: RE | Admit: 2019-05-01 | Discharge: 2019-05-01 | Disposition: A | Discharge status: Home / Self Care | Payer: PPO | Source: Ambulatory Visit | Attending: Radiation Oncology | Admitting: Radiation Oncology

## 2019-05-01 ENCOUNTER — Other Ambulatory Visit: Payer: Self-pay

## 2019-05-01 DIAGNOSIS — Z510 Encounter for antineoplastic radiation therapy: Secondary | ICD-10-CM | POA: Diagnosis not present

## 2019-05-04 ENCOUNTER — Ambulatory Visit
Admission: RE | Admit: 2019-05-04 | Discharge: 2019-05-04 | Disposition: A | Discharge status: Home / Self Care | Payer: PPO | Source: Ambulatory Visit | Attending: Radiation Oncology | Admitting: Radiation Oncology

## 2019-05-04 ENCOUNTER — Other Ambulatory Visit: Payer: Self-pay

## 2019-05-04 DIAGNOSIS — Z510 Encounter for antineoplastic radiation therapy: Secondary | ICD-10-CM | POA: Diagnosis not present

## 2019-05-05 ENCOUNTER — Ambulatory Visit
Admission: RE | Admit: 2019-05-05 | Discharge: 2019-05-05 | Disposition: A | Discharge status: Home / Self Care | Payer: PPO | Source: Ambulatory Visit | Attending: Radiation Oncology | Admitting: Radiation Oncology

## 2019-05-05 ENCOUNTER — Other Ambulatory Visit: Payer: Self-pay

## 2019-05-05 DIAGNOSIS — Z510 Encounter for antineoplastic radiation therapy: Secondary | ICD-10-CM | POA: Diagnosis not present

## 2019-05-05 DIAGNOSIS — D0512 Intraductal carcinoma in situ of left breast: Secondary | ICD-10-CM | POA: Diagnosis not present

## 2019-05-05 DIAGNOSIS — Z170 Estrogen receptor positive status [ER+]: Secondary | ICD-10-CM | POA: Diagnosis not present

## 2019-05-06 ENCOUNTER — Inpatient Hospital Stay: Payer: PPO

## 2019-05-06 ENCOUNTER — Ambulatory Visit
Admission: RE | Admit: 2019-05-06 | Discharge: 2019-05-06 | Disposition: A | Discharge status: Home / Self Care | Payer: PPO | Source: Ambulatory Visit | Attending: Radiation Oncology | Admitting: Radiation Oncology

## 2019-05-06 ENCOUNTER — Other Ambulatory Visit: Payer: Self-pay

## 2019-05-06 DIAGNOSIS — Z510 Encounter for antineoplastic radiation therapy: Secondary | ICD-10-CM | POA: Diagnosis not present

## 2019-05-06 DIAGNOSIS — D0512 Intraductal carcinoma in situ of left breast: Secondary | ICD-10-CM

## 2019-05-06 DIAGNOSIS — D0592 Unspecified type of carcinoma in situ of left breast: Secondary | ICD-10-CM | POA: Diagnosis not present

## 2019-05-06 LAB — CBC
HCT: 39.7 % (ref 36.0–46.0)
Hemoglobin: 13 g/dL (ref 12.0–15.0)
MCH: 31.5 pg (ref 26.0–34.0)
MCHC: 32.7 g/dL (ref 30.0–36.0)
MCV: 96.1 fL (ref 80.0–100.0)
Platelets: 231 10*3/uL (ref 150–400)
RBC: 4.13 MIL/uL (ref 3.87–5.11)
RDW: 14.6 % (ref 11.5–15.5)
WBC: 6.6 10*3/uL (ref 4.0–10.5)
nRBC: 0 % (ref 0.0–0.2)

## 2019-05-07 ENCOUNTER — Other Ambulatory Visit: Payer: Self-pay

## 2019-05-07 ENCOUNTER — Ambulatory Visit
Admission: RE | Admit: 2019-05-07 | Discharge: 2019-05-07 | Disposition: A | Discharge status: Home / Self Care | Payer: PPO | Source: Ambulatory Visit | Attending: Radiation Oncology | Admitting: Radiation Oncology

## 2019-05-07 DIAGNOSIS — Z510 Encounter for antineoplastic radiation therapy: Secondary | ICD-10-CM | POA: Diagnosis not present

## 2019-05-08 ENCOUNTER — Other Ambulatory Visit: Payer: Self-pay

## 2019-05-08 ENCOUNTER — Ambulatory Visit
Admission: RE | Admit: 2019-05-08 | Discharge: 2019-05-08 | Disposition: A | Discharge status: Home / Self Care | Payer: PPO | Source: Ambulatory Visit | Attending: Radiation Oncology | Admitting: Radiation Oncology

## 2019-05-08 DIAGNOSIS — Z510 Encounter for antineoplastic radiation therapy: Secondary | ICD-10-CM | POA: Diagnosis not present

## 2019-05-12 ENCOUNTER — Other Ambulatory Visit: Payer: Self-pay

## 2019-05-12 ENCOUNTER — Ambulatory Visit
Admission: RE | Admit: 2019-05-12 | Discharge: 2019-05-12 | Disposition: A | Discharge status: Home / Self Care | Payer: PPO | Source: Ambulatory Visit | Attending: Radiation Oncology | Admitting: Radiation Oncology

## 2019-05-12 DIAGNOSIS — D0512 Intraductal carcinoma in situ of left breast: Secondary | ICD-10-CM | POA: Diagnosis not present

## 2019-05-12 DIAGNOSIS — Z170 Estrogen receptor positive status [ER+]: Secondary | ICD-10-CM | POA: Diagnosis not present

## 2019-05-12 DIAGNOSIS — Z510 Encounter for antineoplastic radiation therapy: Secondary | ICD-10-CM | POA: Diagnosis not present

## 2019-05-19 ENCOUNTER — Other Ambulatory Visit: Payer: Self-pay

## 2019-05-19 ENCOUNTER — Encounter: Payer: Self-pay | Admitting: General Surgery

## 2019-05-19 ENCOUNTER — Ambulatory Visit (INDEPENDENT_AMBULATORY_CARE_PROVIDER_SITE_OTHER): Payer: PPO | Admitting: General Surgery

## 2019-05-19 VITALS — BP 165/95 | HR 61 | Temp 97.2°F | Ht 66.0 in | Wt 183.0 lb

## 2019-05-19 DIAGNOSIS — D0512 Intraductal carcinoma in situ of left breast: Secondary | ICD-10-CM

## 2019-05-19 NOTE — Progress Notes (Signed)
Patient ID: Erin Hinton, female   DOB: 1946/08/12, 73 y.o.   MRN: [id number]  Chief Complaint  Patient presents with  . Follow-up    HPI Erin Hinton is a 73 y.o. female here today for her follow up left breast lumpectomy . Patient finished radiation on 05/12/2019.  HPI  Past Medical History:  Diagnosis Date  . Anxiety   . Breast cancer [facility]) 1996   Right breast, Dr Faylene Million, DCIS, lumpectomy only  . Cancer ([place])    skin  . 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 ER positive: 51-90%  . Depression   . Heart murmur    in her 30's and it is not a problem now  . Hyperlipidemia   . Hyperlipidemia   . Hypertension   . Osteopenia   . Shortness of breath dyspnea    due to excercise only    Past Surgical History:  Procedure Laterality Date  . BREAST BIOPSY Left 01/27/2019   Affirm Bx- Coil Clip- HIGH-GRADE DUCTAL CARCINOMA IN SITU, WITH COMEDONECROSIS.  Marland Kitchen BREAST EXCISIONAL BIOPSY Right 01/2002   neg. no lymph node involvement  . BREAST EXCISIONAL BIOPSY Left 03/02/2019   lumpectomy DCIS  . BREAST LUMPECTOMY Left 03/02/2019   left lumpectomy dcis with NL  . BREAST LUMPECTOMY Left 03/02/2019   Procedure: BREAST WIDE EXCISION LEFT, WITH WIRE LOCATION;  Surgeon: Robert Bellow, MD;  Location: [facility] ORS;  Service: General;  Laterality: Left;  . 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 CATARACT EXTRACTION W/PHACO Right 05/01/2016   Procedure: CATARACT EXTRACTION PHACO AND INTRAOCULAR LENS PLACEMENT (IOC);  Surgeon: Birder Robson, MD;  Location: [facility] ORS;  Service: Ophthalmology;  Laterality: Right;  [place] 00:53 AP% 21.7 CDE 11.50 fluid pack lot # [id number]  . CATARACT EXTRACTION W/PHACO Left 06/05/2016   Procedure: CATARACT EXTRACTION PHACO AND INTRAOCULAR LENS PLACEMENT ([place]);  Surgeon: Birder Robson, MD;  Location: [facility] ORS;  Service: Ophthalmology;  Laterality: Left;  [place] 01:04 AP% 22.2 CDE 14.34 fluid pack lot #  [id number]  . COLONOSCOPY WITH PROPOFOL N/A 11/21/2015   Procedure: COLONOSCOPY WITH PROPOFOL;  Surgeon: Hulen Luster, MD;  Location[facility] ENDOSCOPY;  Service: Gastroenterology;  Laterality: N/A;    Family History  Problem Relation Age of Onset  . Ovarian cancer Mother   . Heart disease Sister   . Lung cancer Sister   . Lymphoma Father   . Heart disease Father        MI  . Hypertension Father   . Alcohol abuse Brother   . Lung cancer Brother   . Breast cancer Sister 102  . Heart disease Sister   . Heart attack Sister   . Breast cancer Daughter [address]       Theophilus Kinds, LCIS  . Breast cancer Paternal Aunt 61  . Colon cancer Neg Hx     Social History Social History   Tobacco Use  . Smoking status: Former Smoker    Packs/day: 0.50    Years: 45.00    Pack years: 22.50    Types: Cigarettes, E-cigarettes    Last attempt to quit: 01/22/2016    Years since quitting: 3.3  . Smokeless tobacco: Never Used  . Tobacco comment: also uses e-cigaretts on occasion  Substance Use Topics  . Alcohol use: No    Alcohol/week: 0.0 standard drinks  . Drug use: No    Allergies  Allergen Reactions  .  Alendronate Sodium     GI pain  . Atorvastatin     Muscle pain  . Crestor [Rosuvastatin Calcium]     Muscle pain  . Evista [Raloxifene] Other (See Comments)    Breast tenderness  . Sertraline Hcl     Did not help, made anxiety worse  . Pseudoephedrine Palpitations    Current Outpatient Medications  Medication Sig Dispense Refill  . acetaminophen (TYLENOL) 500 MG tablet Take 500 mg by mouth every 6 (six) hours as needed (for pain.).    Marland Kitchen Ascorbic Acid (VITAMIN C WITH ROSE HIPS) 1000 MG tablet Take 1,000 mg by mouth daily.    . Calcium Carb-Cholecalciferol (CALCIUM 600 + D PO) Take 1 capsule by mouth daily.    . Calcium-Magnesium-Zinc (CAL-MAG-ZINC PO) Take 1 tablet by mouth daily.    . Cholecalciferol (VITAMIN D) 50 MCG (2000 UT) tablet Take 2,000 Units by mouth daily.    . Coconut Oil 1000 MG  CAPS Take 1,000 mg by mouth daily.    Marland Kitchen escitalopram (LEXAPRO) 20 MG tablet Take 1 tablet (20 mg total) by mouth daily. 90 tablet 3  . fluticasone (FLONASE) 50 MCG/ACT nasal spray Place 2 sprays into both nostrils daily. (Patient taking differently: Place 2 sprays into both nostrils daily as needed for allergies. ) 48 g 3  . fluticasone (FLONASE) 50 MCG/ACT nasal spray Place 2 sprays into both nostrils daily as needed for allergies or rhinitis.    . hydrochlorothiazide (HYDRODIURIL) 25 MG tablet Take 1 tablet (25 mg total) by mouth daily. 90 tablet 3  . Omega-3 Fatty Acids (FISH OIL) 1000 MG CAPS Take 1,000 mg by mouth daily.     No current facility-administered medications for this visit.     Review of Systems Review of Systems  Constitutional: Negative.   Respiratory: Negative.   Cardiovascular: Negative.     Blood pressure (!) 165/95, pulse 61, temperature (!) 97.2 F (36.2 C), temperature source Skin, height 5\' 6"  (1.676 m), weight 183 lb (83 kg), SpO2 98 %.  Physical Exam Physical Exam Exam conducted with a chaperone present.  Constitutional:      Appearance: She is well-developed.  Eyes:     General: No scleral icterus.    Conjunctiva/sclera: Conjunctivae normal.  Neck:     Musculoskeletal: Neck supple.  Cardiovascular:     Heart sounds: Normal heart sounds.  Chest:     Breasts:        Right: Normal.        Left: Normal.    Lymphadenopathy:     Cervical: No cervical adenopathy.  Skin:    General: Skin is warm and dry.  Neurological:     Mental Status: She is alert and oriented to person, place, and time.     Data Reviewed High-grade DCIS, ER 90%.  10 mm margins.  Assessment Doing well post excision of high-grade DCIS and whole breast radiation.  Plan  The patient is a candidate for antiestrogen therapy.  We will arrange for bone density testing to determine if she would be best suited with tamoxifen or an aromatase inhibitor.  Patient to return in 1  month The patient is aware to call back for any questions or concerns.  HPI, Physical Exam, Assessment and Plan have been scribed under the direction and in the presence of Hervey Ard, MD.  Gaspar Cola, CMA  I have completed the exam and reviewed the above documentation for accuracy and completeness.  I agree with the above.  Haematologist has been used and any errors in dictation or transcription are unintentional.  Hervey Ard, M.D., F.A.C.S.   Forest Gleason Wreatha Sturgeon 05/19/2019, 8:50 PM

## 2019-05-19 NOTE — Patient Instructions (Addendum)
Bone density Than return after test

## 2019-06-15 ENCOUNTER — Ambulatory Visit
Admission: RE | Admit: 2019-06-15 | Discharge: 2019-06-15 | Disposition: A | Discharge status: Home / Self Care | Payer: PPO | Source: Ambulatory Visit | Attending: Radiation Oncology | Admitting: Radiation Oncology

## 2019-06-15 ENCOUNTER — Encounter: Payer: Self-pay | Admitting: Radiation Oncology

## 2019-06-15 ENCOUNTER — Other Ambulatory Visit: Payer: Self-pay

## 2019-06-15 VITALS — BP 145/78 | HR 72 | Temp 98.3°F | Resp 16 | Wt 183.8 lb

## 2019-06-15 DIAGNOSIS — M79622 Pain in left upper arm: Secondary | ICD-10-CM | POA: Diagnosis not present

## 2019-06-15 DIAGNOSIS — Z923 Personal history of irradiation: Secondary | ICD-10-CM | POA: Diagnosis not present

## 2019-06-15 DIAGNOSIS — Z170 Estrogen receptor positive status [ER+]: Secondary | ICD-10-CM | POA: Diagnosis not present

## 2019-06-15 DIAGNOSIS — D0512 Intraductal carcinoma in situ of left breast: Secondary | ICD-10-CM

## 2019-06-15 NOTE — Progress Notes (Signed)
Radiation Oncology Follow up Note  Name: Erin Hinton   Date:   06/15/2019 MRN:  [id number] DOB: 1946-03-06    This 73 y.o. 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  Seen today in routine follow-up she is doing well.  She specifically denies breast tenderness cough or bone pain.  She is not yet started antiestrogen therapy she has an appointment with her oncologist in the next several weeks..  COMPLICATIONS OF TREATMENT: none  FOLLOW UP COMPLIANCE: keeps appointments   PHYSICAL EXAM:  BP (!) 145/78 (BP Location: Right Arm, Patient Position: Sitting)   Pulse 72   Temp 98.3 F (36.8 C) (Tympanic)   Resp 16   Wt 183 lb 12.1 oz (83.3 kg)   BMI 29.66 kg/m  Lungs are clear to A&P cardiac examination essentially unremarkable with regular rate and rhythm. No dominant mass or nodularity is noted in either breast in 2 positions examined. Incision is well-healed. No axillary or supraclavicular adenopathy is appreciated. Cosmetic result is excellent.  Well-developed well-nourished patient in NAD. HEENT reveals PERLA, EOMI, discs not visualized.  Oral cavity is clear. No oral mucosal lesions are identified. Neck is clear without evidence of cervical or supraclavicular adenopathy. Lungs are clear to A&P. Cardiac examination is essentially unremarkable with regular rate and rhythm without murmur rub or thrill. Abdomen is benign with no organomegaly or masses noted. Motor sensory and DTR levels are equal and symmetric in the upper and lower extremities. Cranial nerves II through XII are grossly intact. Proprioception is intact. No peripheral adenopathy or edema is identified. No motor or  sensory levels are noted. Crude visual fields are within normal range.  RADIOLOGY RESULTS: No current films for review  PLAN: Present time patient is doing well she is recovered nicely from her radiation therapy treatments.  Still having some soreness consistent with scarring in her left axilla.  I emphasized she needs to exercise that region.  Otherwise I am pleased with her overall progress.  I have asked to see her back in 4 to 5 months for follow-up.  Patient knows to call sooner with any concerns.  I would like to take this opportunity to thank you for allowing me to participate in the care of your patient.Noreene Filbert, MD

## 2019-07-01 ENCOUNTER — Ambulatory Visit
Admission: RE | Admit: 2019-07-01 | Discharge: 2019-07-01 | Disposition: A | Discharge status: Home / Self Care | Payer: PPO | Source: Ambulatory Visit | Attending: General Surgery | Admitting: General Surgery

## 2019-07-01 ENCOUNTER — Other Ambulatory Visit: Payer: Self-pay

## 2019-07-01 DIAGNOSIS — M810 Age-related osteoporosis without current pathological fracture: Secondary | ICD-10-CM | POA: Insufficient documentation

## 2019-07-01 DIAGNOSIS — M85851 Other specified disorders of bone density and structure, right thigh: Secondary | ICD-10-CM | POA: Diagnosis not present

## 2019-07-01 DIAGNOSIS — D0512 Intraductal carcinoma in situ of left breast: Secondary | ICD-10-CM

## 2019-07-01 HISTORY — DX: Personal history of irradiation: Z92.3

## 2019-07-07 ENCOUNTER — Ambulatory Visit: Payer: PPO | Admitting: General Surgery

## 2019-07-20 ENCOUNTER — Telehealth: Payer: Self-pay | Admitting: *Deleted

## 2019-07-20 DIAGNOSIS — D0512 Intraductal carcinoma in situ of left breast: Secondary | ICD-10-CM

## 2019-07-20 NOTE — Telephone Encounter (Signed)
Ref in for hormonal therapy  and recall for mammogram. C/x appt for 07/22/2019 per piscoya

## 2019-07-21 ENCOUNTER — Ambulatory Visit: Payer: PPO | Admitting: General Surgery

## 2019-07-21 ENCOUNTER — Ambulatory Visit: Payer: PPO | Admitting: Surgery

## 2019-07-22 ENCOUNTER — Ambulatory Visit: Payer: PPO | Admitting: Surgery

## 2019-07-31 NOTE — Progress Notes (Signed)
Erin Hinton  [phone] [phone]  ID: Ki S Weins OB: 05/19/1946  MR#: [id number]  CSN#:[id number]  Patient Care Team: Tower, Marne A, MD as PCP - General Porfilio, William, MD as Referring Physician (Ophthalmology)  CHIEF COMPLAINT: DCIS, left breast  INTERVAL HISTORY: Patient is a 73-year-old female who recently completed XRT for DCIS.  Her initial biopsy was in February 2020 and then she underwent lumpectomy on March 02, 2019.  She was referred to clinic today to initiate adjuvant hormonal therapy.  She currently feels well and is asymptomatic.  She has no neurologic complaints.  She denies any recent fevers or illnesses.  She has a good appetite and denies weight loss.  She has no chest pain, shortness of breath, cough, or hemoptysis.  She denies any nausea, vomiting, constipation, or diarrhea.  She has no urinary complaints.  Patient feels at her baseline offers no specific complaints today.  REVIEW OF SYSTEMS:   Review of Systems  Constitutional: Negative.  Negative for fever, malaise/fatigue and weight loss.  Respiratory: Negative.  Negative for cough, hemoptysis and shortness of breath.   Cardiovascular: Negative.  Negative for chest pain and leg swelling.  Gastrointestinal: Negative.  Negative for abdominal pain, blood in stool, nausea and vomiting.  Genitourinary: Negative.  Negative for dysuria.  Musculoskeletal: Negative.  Negative for back pain.  Skin: Negative.  Negative for rash.  Neurological: Negative.  Negative for dizziness, focal weakness, weakness and headaches.  Psychiatric/Behavioral: Negative.  The patient is not nervous/anxious.     As per HPI. Otherwise, a complete review of systems is negative.  PAST MEDICAL HISTORY: Past Medical History:  Diagnosis Date  . Anxiety   . Breast cancer ([facility]) 1996   Right breast, Dr Cerame, DCIS, lumpectomy only  . Cancer ([facility])    skin  . DCIS (ductal carcinoma in situ)  03/02/2019   Left, 11 mm high-grade DCIS, 10 mm margins. ER positive: 51-90%  . Depression   . Heart murmur    in her 30's and it is not a problem now  . Hyperlipidemia   . Hyperlipidemia   . Hypertension   . Osteopenia   . Personal history of radiation therapy   . Shortness of breath dyspnea    due to excercise only    PAST SURGICAL HISTORY: Past Surgical History:  Procedure Laterality Date  . BREAST BIOPSY Left 01/27/2019   Affirm Bx- Coil Clip- HIGH-GRADE DUCTAL CARCINOMA IN SITU, WITH COMEDONECROSIS.  . BREAST EXCISIONAL BIOPSY Right 01/2002   neg. no lymph node involvement  . BREAST EXCISIONAL BIOPSY Left 03/02/2019   lumpectomy DCIS  . BREAST LUMPECTOMY Left 03/02/2019   left lumpectomy dcis with NL  . BREAST LUMPECTOMY Left 03/02/2019   Procedure: BREAST WIDE EXCISION LEFT, WITH WIRE LOCATION;  Surgeon: Byrnett, Jeffrey W, MD;  Location: [facility] ORS;  Service: General;  Laterality: Left;  . 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 CATARACT EXTRACTION W/PHACO Right 05/01/2016   Procedure: CATARACT EXTRACTION PHACO AND INTRAOCULAR LENS PLACEMENT (IOC);  Surgeon: [name], MD;  Location: [facility] ORS;  Service: Ophthalmology;  Laterality: Right;  [place] 00:53 AP% 21.7 CDE 11.50 fluid pack lot # [id number]  . CATARACT EXTRACTION W/PHACO Left 06/05/2016   Procedure: CATARACT EXTRACTION PHACO AND INTRAOCULAR LENS PLACEMENT (IOC);  Surgeon: [name], MD;  Location: [facility] ORS;  Service: Ophthalmology;  Laterality: Left;  [place] 01:04 AP% 22.2 CDE 14.34 fluid pack lot # [id number]  . COLONOSCOPY WITH PROPOFOL   N/A 11/21/2015   Procedure: COLONOSCOPY WITH PROPOFOL;  Surgeon: Paul Y Oh, MD;  Location: [facility] ENDOSCOPY;  Service: Gastroenterology;  Laterality: N/A;    FAMILY HISTORY: Family History  Problem Relation Age of Onset  . Ovarian cancer Mother   . Heart disease Sister   . Lung cancer Sister   . Lymphoma Father   . Heart disease Father        MI  . Hypertension Father    . Alcohol abuse Brother   . Lung cancer Brother   . Breast cancer Sister 72  . Heart disease Sister   . Heart attack Sister   . Breast cancer Daughter 43       Erin Hinton, LCIS  . Breast cancer Paternal Aunt 50  . Colon cancer Neg Hx     ADVANCED DIRECTIVES (Y/N):  N  HEALTH MAINTENANCE: Social History   Tobacco Use  . Smoking status: Former Smoker    Packs/day: 0.50    Years: 45.00    Pack years: 22.50    Types: Cigarettes, E-cigarettes    Quit date: 01/22/2016    Years since quitting: 3.5  . Smokeless tobacco: Never Used  . Tobacco comment: also uses e-cigaretts on occasion  Substance Use Topics  . Alcohol use: No    Alcohol/week: 0.0 standard drinks  . Drug use: No     Colonoscopy:  PAP:  Bone density:  Lipid panel:  Allergies  Allergen Reactions  . Alendronate Sodium     GI pain  . Atorvastatin     Muscle pain  . Crestor [Rosuvastatin Calcium]     Muscle pain  . Evista [Raloxifene] Other (See Comments)    Breast tenderness  . Sertraline Hcl     Did not help, made anxiety worse  . Pseudoephedrine Palpitations    Current Outpatient Medications  Medication Sig Dispense Refill  . acetaminophen (TYLENOL) 500 MG tablet Take 500 mg by mouth every 6 (six) hours as needed (for pain.).    . Ascorbic Acid (VITAMIN C WITH Hinton HIPS) 1000 MG tablet Take 1,000 mg by mouth daily.    . Calcium Carb-Cholecalciferol (CALCIUM 600 + D PO) Take 1 capsule by mouth daily.    . Calcium-Magnesium-Zinc (CAL-MAG-ZINC PO) Take 1 tablet by mouth daily.    . Cholecalciferol (VITAMIN D) 50 MCG (2000 UT) tablet Take 2,000 Units by mouth daily.    . Coconut Oil 1000 MG CAPS Take 1,000 mg by mouth daily.    . escitalopram (LEXAPRO) 20 MG tablet Take 1 tablet (20 mg total) by mouth daily. 90 tablet 3  . fluticasone (FLONASE) 50 MCG/ACT nasal spray Place 2 sprays into both nostrils daily. (Patient taking differently: Place 2 sprays into both nostrils daily as needed for allergies. ) 48  g 3  . fluticasone (FLONASE) 50 MCG/ACT nasal spray Place 2 sprays into both nostrils daily as needed for allergies or rhinitis.    . hydrochlorothiazide (HYDRODIURIL) 25 MG tablet Take 1 tablet (25 mg total) by mouth daily. 90 tablet 3  . Omega-3 Fatty Acids (FISH OIL) 1000 MG CAPS Take 1,000 mg by mouth daily.    . tamoxifen (NOLVADEX) 20 MG tablet Take 1 tablet (20 mg total) by mouth daily. 90 tablet 3   No current facility-administered medications for this visit.     OBJECTIVE: Vitals:   08/06/19 1117  BP: (!) 167/82  Pulse: 76  Temp: (!) 97.4 F (36.3 C)     Body mass index is 29.92   kg/m.    ECOG FS:0 - Asymptomatic  General: Well-developed, well-nourished, no acute distress. Eyes: Pink conjunctiva, anicteric sclera. HEENT: Normocephalic, moist mucous membranes, clear oropharnyx. Breast: Exam deferred today. Lungs: Clear to auscultation bilaterally. Heart: Regular rate and rhythm. No rubs, murmurs, or gallops. Abdomen: Soft, nontender, nondistended. No organomegaly noted, normoactive bowel sounds. Musculoskeletal: No edema, cyanosis, or clubbing. Neuro: Alert, answering all questions appropriately. Cranial nerves grossly intact. Skin: No rashes or petechiae noted. Psych: Normal affect. Lymphatics: No cervical, calvicular, axillary or inguinal LAD.   LAB RESULTS:  Lab Results  Component Value Date   NA 139 08/21/2018   K 4.5 08/21/2018   CL 102 08/21/2018   CO2 32 08/21/2018   GLUCOSE 111 (H) 08/21/2018   BUN 22 08/21/2018   CREATININE 0.85 08/21/2018   CALCIUM 10.3 08/21/2018   PROT 7.2 08/21/2018   ALBUMIN 4.5 08/21/2018   AST 16 08/21/2018   ALT 12 08/21/2018   ALKPHOS 56 08/21/2018   BILITOT 0.7 08/21/2018   GFRNONAA 88.56 11/28/2010   GFRAA 94 11/23/2008    Lab Results  Component Value Date   WBC 6.6 05/06/2019   NEUTROABS 4.7 08/21/2018   HGB 13.0 05/06/2019   HCT 39.7 05/06/2019   MCV 96.1 05/06/2019   PLT 231 05/06/2019     STUDIES: No  results found.  ASSESSMENT: DCIS, left breast  PLAN:   1. DCIS, left breast: Patient initial diagnosis was in February 2020 she subsequently underwent lumpectomy on March 02, 2019.  She has now completed adjuvant XRT.  Patient admits she is hesitant to start adjuvant hormonal therapy since her daughter is also taking tamoxifen for DCIS and is having significant hot flashes.  She has agreed to attempt treatment and was given a prescription for tamoxifen today.  If tolerated, she will take treatment for 5 years completing in August 2025.  Return to clinic in 3 months for video assisted telemedicine visit.  I spent a total of 60 minutes face-to-face with the patient of which greater than 50% of the visit was spent in counseling and coordination of care as detailed above.   Patient expressed understanding and was in agreement with this plan. She also understands that She can call clinic at any time with any questions, concerns, or complaints.   Cancer Staging Ductal carcinoma in situ (DCIS) of left breast Staging form: Breast, AJCC 8th Edition - Clinical stage from 08/06/2019: Stage 0 (cTis (DCIS), cN0, cM0, ER+, PR+, HER2-) - Signed by Lloyd Huger, MD on 08/06/2019   Lloyd Huger, MD   08/06/2019 11:57 AM

## 2019-08-05 ENCOUNTER — Other Ambulatory Visit: Payer: Self-pay

## 2019-08-06 ENCOUNTER — Other Ambulatory Visit: Payer: Self-pay

## 2019-08-06 ENCOUNTER — Other Ambulatory Visit: Payer: Self-pay | Admitting: *Deleted

## 2019-08-06 ENCOUNTER — Inpatient Hospital Stay: Payer: PPO | Attending: Oncology | Admitting: Oncology

## 2019-08-06 VITALS — BP 167/82 | HR 76 | Temp 97.4°F | Wt 185.4 lb

## 2019-08-06 DIAGNOSIS — R011 Cardiac murmur, unspecified: Secondary | ICD-10-CM | POA: Insufficient documentation

## 2019-08-06 DIAGNOSIS — M8580 Other specified disorders of bone density and structure, unspecified site: Secondary | ICD-10-CM | POA: Diagnosis not present

## 2019-08-06 DIAGNOSIS — I10 Essential (primary) hypertension: Secondary | ICD-10-CM

## 2019-08-06 DIAGNOSIS — Z79899 Other long term (current) drug therapy: Secondary | ICD-10-CM | POA: Diagnosis not present

## 2019-08-06 DIAGNOSIS — Z79810 Long term (current) use of selective estrogen receptor modulators (SERMs): Secondary | ICD-10-CM | POA: Insufficient documentation

## 2019-08-06 DIAGNOSIS — D0512 Intraductal carcinoma in situ of left breast: Secondary | ICD-10-CM | POA: Diagnosis not present

## 2019-08-06 DIAGNOSIS — Z803 Family history of malignant neoplasm of breast: Secondary | ICD-10-CM | POA: Diagnosis not present

## 2019-08-06 DIAGNOSIS — F419 Anxiety disorder, unspecified: Secondary | ICD-10-CM | POA: Diagnosis not present

## 2019-08-06 DIAGNOSIS — Z170 Estrogen receptor positive status [ER+]: Secondary | ICD-10-CM | POA: Insufficient documentation

## 2019-08-06 DIAGNOSIS — E785 Hyperlipidemia, unspecified: Secondary | ICD-10-CM | POA: Insufficient documentation

## 2019-08-06 DIAGNOSIS — F329 Major depressive disorder, single episode, unspecified: Secondary | ICD-10-CM

## 2019-08-06 DIAGNOSIS — Z87891 Personal history of nicotine dependence: Secondary | ICD-10-CM | POA: Insufficient documentation

## 2019-08-06 DIAGNOSIS — Z923 Personal history of irradiation: Secondary | ICD-10-CM | POA: Insufficient documentation

## 2019-08-06 MED ORDER — HYDROCHLOROTHIAZIDE 25 MG PO TABS: 25.0000 mg | ORAL_TABLET | Freq: Every day | ORAL | 0 refills | Status: DC

## 2019-08-06 MED ORDER — TAMOXIFEN CITRATE 20 MG PO TABS: 20.0000 mg | ORAL_TABLET | Freq: Every day | ORAL | 3 refills | Status: DC

## 2019-08-06 NOTE — Progress Notes (Addendum)
Pt here for new pt visit with Dr. Grayland Ormond. No acute concerns noted.

## 2019-08-26 ENCOUNTER — Telehealth: Payer: Self-pay | Admitting: Family Medicine

## 2019-08-26 DIAGNOSIS — R7303 Prediabetes: Secondary | ICD-10-CM

## 2019-08-26 DIAGNOSIS — I10 Essential (primary) hypertension: Secondary | ICD-10-CM

## 2019-08-26 DIAGNOSIS — E7800 Pure hypercholesterolemia, unspecified: Secondary | ICD-10-CM

## 2019-08-26 NOTE — Telephone Encounter (Signed)
-----   Message from Cloyd Stagers, RT sent at 08/19/2019  2:25 PM EDT ----- Regarding: Lab Orders for Thursday 9.10.2020 Please place lab orders for Thursday 9.10.2020, office visit for physical on Wednesday 9.16.2020 Thank you, Dyke Maes RT(R)

## 2019-08-27 ENCOUNTER — Ambulatory Visit: Payer: Self-pay

## 2019-08-27 ENCOUNTER — Other Ambulatory Visit (INDEPENDENT_AMBULATORY_CARE_PROVIDER_SITE_OTHER): Payer: PPO

## 2019-08-27 DIAGNOSIS — E7800 Pure hypercholesterolemia, unspecified: Secondary | ICD-10-CM | POA: Diagnosis not present

## 2019-08-27 DIAGNOSIS — R7303 Prediabetes: Secondary | ICD-10-CM | POA: Diagnosis not present

## 2019-08-27 DIAGNOSIS — I10 Essential (primary) hypertension: Secondary | ICD-10-CM | POA: Diagnosis not present

## 2019-08-27 LAB — CBC WITH DIFFERENTIAL/PLATELET
Basophils Absolute: 0 10*3/uL (ref 0.0–0.1)
Basophils Relative: 0.5 % (ref 0.0–3.0)
Eosinophils Absolute: 0.1 10*3/uL (ref 0.0–0.7)
Eosinophils Relative: 1.2 % (ref 0.0–5.0)
HCT: 39.2 % (ref 36.0–46.0)
Hemoglobin: 13.2 g/dL (ref 12.0–15.0)
Lymphocytes Relative: 4.6 % — ABNORMAL LOW (ref 12.0–46.0)
Lymphs Abs: 0.3 10*3/uL — ABNORMAL LOW (ref 0.7–4.0)
MCHC: 33.7 g/dL (ref 30.0–36.0)
MCV: 99.3 fl (ref 78.0–100.0)
Monocytes Absolute: 0.4 10*3/uL (ref 0.1–1.0)
Monocytes Relative: 7.3 % (ref 3.0–12.0)
Neutro Abs: 5 10*3/uL (ref 1.4–7.7)
Neutrophils Relative %: 86.4 % — ABNORMAL HIGH (ref 43.0–77.0)
Platelets: 191 10*3/uL (ref 150.0–400.0)
RBC: 3.95 Mil/uL (ref 3.87–5.11)
RDW: 13 % (ref 11.5–15.5)
WBC: 5.8 10*3/uL (ref 4.0–10.5)

## 2019-08-27 LAB — LIPID PANEL
Cholesterol: 195 mg/dL (ref 0–200)
HDL: 49.8 mg/dL (ref >39.00)
LDL Cholesterol: 130 mg/dL — ABNORMAL HIGH (ref 0–99)
NonHDL: 144.94
Total CHOL/HDL Ratio: 4
Triglycerides: 73 mg/dL (ref 0.0–149.0)
VLDL: 14.6 mg/dL (ref 0.0–40.0)

## 2019-08-27 LAB — HEMOGLOBIN A1C: Hgb A1c MFr Bld: 5.7 % (ref 4.6–6.5)

## 2019-08-27 LAB — COMPREHENSIVE METABOLIC PANEL
ALT: 9 U/L (ref 0–35)
AST: 14 U/L (ref 0–37)
Albumin: 3.9 g/dL (ref 3.5–5.2)
Alkaline Phosphatase: 58 U/L (ref 39–117)
BUN: 19 mg/dL (ref 6–23)
CO2: 32 mEq/L (ref 19–32)
Calcium: 9.3 mg/dL (ref 8.4–10.5)
Chloride: 104 mEq/L (ref 96–112)
Creatinine, Ser: 0.78 mg/dL (ref 0.40–1.20)
GFR: 72.79 mL/min (ref >60.00)
Glucose, Bld: 101 mg/dL — ABNORMAL HIGH (ref 70–99)
Potassium: 4.1 mEq/L (ref 3.5–5.1)
Sodium: 140 mEq/L (ref 135–145)
Total Bilirubin: 0.6 mg/dL (ref 0.2–1.2)
Total Protein: 6.6 g/dL (ref 6.0–8.3)

## 2019-08-27 LAB — TSH: TSH: 2.04 u[IU]/mL (ref 0.35–4.50)

## 2019-09-02 ENCOUNTER — Ambulatory Visit (INDEPENDENT_AMBULATORY_CARE_PROVIDER_SITE_OTHER): Payer: PPO | Admitting: Family Medicine

## 2019-09-02 ENCOUNTER — Other Ambulatory Visit: Payer: Self-pay

## 2019-09-02 ENCOUNTER — Encounter: Payer: Self-pay | Admitting: Family Medicine

## 2019-09-02 VITALS — BP 136/78 | HR 83 | Temp 97.5°F | Ht 65.0 in | Wt 183.1 lb

## 2019-09-02 DIAGNOSIS — R7303 Prediabetes: Secondary | ICD-10-CM

## 2019-09-02 DIAGNOSIS — Z0000 Encounter for general adult medical examination without abnormal findings: Secondary | ICD-10-CM | POA: Insufficient documentation

## 2019-09-02 DIAGNOSIS — I10 Essential (primary) hypertension: Secondary | ICD-10-CM

## 2019-09-02 DIAGNOSIS — M810 Age-related osteoporosis without current pathological fracture: Secondary | ICD-10-CM | POA: Diagnosis not present

## 2019-09-02 DIAGNOSIS — D0512 Intraductal carcinoma in situ of left breast: Secondary | ICD-10-CM

## 2019-09-02 DIAGNOSIS — E7800 Pure hypercholesterolemia, unspecified: Secondary | ICD-10-CM | POA: Diagnosis not present

## 2019-09-02 MED ORDER — HYDROCHLOROTHIAZIDE 25 MG PO TABS: 25.0000 mg | ORAL_TABLET | Freq: Every day | ORAL | 3 refills | Status: DC

## 2019-09-02 MED ORDER — ESCITALOPRAM OXALATE 20 MG PO TABS: 20.0000 mg | ORAL_TABLET | Freq: Every day | ORAL | 3 refills | Status: DC

## 2019-09-02 MED ORDER — FLUTICASONE PROPIONATE 50 MCG/ACT NA SUSP: 2.0000 | Freq: Every day | NASAL | 3 refills | Status: DC | PRN

## 2019-09-02 NOTE — Assessment & Plan Note (Signed)
Reviewed health habits including diet and exercise and skin cancer prevention Reviewed appropriate screening tests for age  Also reviewed health mt list, fam hx and immunization status , as well as social and family history   See HPI Labs reviewed  Underwent breast cancer tx  No longer smokes Flu shot is utd for the season  Rev dexa and disc OP tx  Has adv directive No cognitive concerns  Nl hearing screen and vision screen  She is also planning routine opth appt  Dermatology appt for screening is upcoming

## 2019-09-02 NOTE — Patient Instructions (Addendum)
Talk to your oncologist about treatment for osteoporosis  You cannot take alendronate (and could not take evista)  There is a medicine called Prolia -if insurance will cover it  Here is a handout   Gradually increase activity   Take care of yourself   Eat healthy  Try to get most of your carbohydrates from produce (with the exception of white potatoes)  Eat less bread/pasta/rice/snack foods/cereals/sweets and other items from the middle of the grocery store (processed carbs)

## 2019-09-02 NOTE — Assessment & Plan Note (Signed)
Reviewed health habits including diet and exercise and skin cancer prevention Reviewed appropriate screening tests for age  Also reviewed health mt list, fam hx and immunization status , as well as social and family history   See HPI Labs reviewed  Underwent breast cancer tx  No longer smokes Flu shot is utd for the season  Rev dexa and disc OP tx  Has adv directive No cognitive concerns  Nl hearing screen and vision screen  She is also planning routine opth appt

## 2019-09-02 NOTE — Progress Notes (Signed)
Subjective:    Patient ID: Erin Hinton, female    DOB: Mar 12, 1946, 73 y.o.   MRN: [id number]  HPI Here for amw, health mt exam and rev of chronic medical problems   I have personally reviewed the Medicare Annual Wellness questionnaire and have noted 1. The patient's medical and social history 2. Their use of alcohol, tobacco or illicit drugs 3. Their current medications and supplements 4. The patient's functional ability including ADL's, fall risks, home safety risks and hearing or visual             impairment. 5. Diet and physical activities 6. Evidence for depression or mood disorders  The patients weight, height, BMI have been recorded in the chart and visual acuity is per eye clinic.  I have made referrals, counseling and provided education to the patient based review of the above and I have provided the pt with a written personalized care plan for preventive services. Reviewed and updated provider list, see scanned forms.  See scanned forms.  Routine anticipatory guidance given to patient.  See health maintenance. 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  No worries about her memory  Not getting lost or confused   PMH and SH reviewed  Meds, vitals, and allergies reviewed.   ROS: See HPI.  Otherwise negative.    Weight : Wt Readings from  Last 3 Encounters:  09/02/19 183 lb 2 oz (83.1 kg)  08/06/19 185 lb 6.4 oz (84.1 kg)  06/15/19 183 lb 12.1 oz (83.3 kg)  just starting back walking after cancer treatment  30.47 kg/m   Hearing/vision:  Hearing Screening   125Hz  250Hz  500Hz  1000Hz  2000Hz  3000Hz  4000Hz  6000Hz  8000Hz   Right ear:   40 40 40  40    Left ear:   40 40 40  40      Visual Acuity Screening   Right eye Left eye Both eyes  Without correction: 20/30 20/20 20/20   With correction:      Is due for her usual eye exam    bp is stable today  No cp or palpitations or headaches or edema  No side effects to medicines  BP Readings from Last 3 Encounters:  09/02/19 136/78  08/06/19 (!) 167/82  06/15/19 (!) 145/78     Lab Results  Component Value Date   CREATININE 0.78 08/27/2019   BUN 19 08/27/2019   NA 140 08/27/2019   K 4.1 08/27/2019   CL 104 08/27/2019   CO2 32 08/27/2019   Lab Results  Component Value Date   ALT 9 08/27/2019   AST 14 08/27/2019   ALKPHOS 58 08/27/2019   BILITOT 0.6 08/27/2019   Lab Results  Component Value Date   WBC 5.8 08/27/2019   HGB 13.2 08/27/2019   HCT 39.2 08/27/2019   MCV 99.3 08/27/2019   PLT 191.0 08/27/2019   Lab Results  Component Value  Date   TSH 2.04 08/27/2019     Hyperlipidemia Lab Results  Component Value Date   CHOL 195 08/27/2019   CHOL 230 (H) 08/21/2018   CHOL 185 08/26/2017   Lab Results  Component Value Date   HDL 49.80 08/27/2019   HDL 62.10 08/21/2018   HDL 55.10 08/26/2017   Lab Results  Component Value Date   LDLCALC 130 (H) 08/27/2019   LDLCALC 148 (H) 08/21/2018   LDLCALC 115 (H) 08/26/2017   Lab Results  Component Value Date   TRIG 73.0 08/27/2019   TRIG 98.0 08/21/2018   TRIG 74.0 08/26/2017   Lab Results  Component Value Date   CHOLHDL 4 08/27/2019   CHOLHDL 4 08/21/2018   CHOLHDL 3 08/26/2017   Lab Results  Component Value Date   LDLDIRECT 128.6 11/28/2010   LDLDIRECT 152.1 11/24/2009   LDLDIRECT 138.9  11/23/2008  intol of statins Overall LDL is down  HDL down with tamoxifen   Prediabetes Lab Results  Component Value Date   HGBA1C 5.7 08/27/2019   Patient Active Problem List   Diagnosis Date Noted  . Medicare annual wellness visit, subsequent 09/02/2019  . Mitral prolapse 02/11/2019  . Abnormal EKG 02/10/2019  . 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  . Family history of heart disease 07/15/2017  . Prediabetes 07/15/2017  . Anxiety 07/15/2017  . Preoperative cardiovascular examination 04/07/2014  . Colon cancer screening 03/09/2013  . Routine general medical examination at a health care facility 01/30/2012  . HYPERTENSION, BENIGN ESSENTIAL 02/01/2010  . Osteoporosis 11/23/2008  . Former smoker 10/21/2007  . Hyperlipidemia 09/29/2007  . MITRAL VALVE PROLAPSE 09/29/2007  . 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 ALLERGY 09/29/2007   Past Medical History:  Diagnosis Date  . Anxiety   . Breast cancer [facility]) 1996   Right breast, Dr Faylene Million, DCIS, lumpectomy only  . Cancer ([place])    skin  . 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 ER positive: 51-90%  . Depression   . Heart murmur    in her 30's and it is not a problem now  . Hyperlipidemia   . Hyperlipidemia   . Hypertension   . Osteopenia   . Personal history of radiation therapy   . Shortness of breath dyspnea    due to excercise only   Past Surgical History:  Procedure Laterality Date  . BREAST BIOPSY Left 01/27/2019   Affirm Bx- Coil Clip- HIGH-GRADE DUCTAL CARCINOMA IN SITU, WITH COMEDONECROSIS.  Marland Kitchen BREAST EXCISIONAL BIOPSY Right 01/2002   neg. no lymph node involvement  . BREAST EXCISIONAL BIOPSY Left 03/02/2019   lumpectomy DCIS  . BREAST LUMPECTOMY Left 03/02/2019   left lumpectomy dcis with NL  . BREAST LUMPECTOMY Left 03/02/2019   Procedure: BREAST WIDE EXCISION LEFT, WITH WIRE LOCATION;  Surgeon: Robert Bellow, MD;  Location: [facility]  ORS;  Service: General;  Laterality: Left;  . 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 CATARACT EXTRACTION W/PHACO Right 05/01/2016   Procedure: CATARACT EXTRACTION PHACO AND INTRAOCULAR LENS PLACEMENT (IOC);  Surgeon: Birder Robson, MD;  Location: [facility] ORS;  Service: Ophthalmology;  Laterality: Right;  [place] 00:53 AP% 21.7 CDE 11.50 fluid pack lot # [id number]  . CATARACT EXTRACTION W/PHACO Left 06/05/2016   Procedure: CATARACT EXTRACTION PHACO AND INTRAOCULAR LENS PLACEMENT ([place]);  Surgeon: Birder Robson, MD;  Location: [facility] ORS;  Service: Ophthalmology;  Laterality: Left;  [place] 01:04 AP%  22.2 CDE 14.34 fluid pack lot # [id number]  . COLONOSCOPY WITH PROPOFOL N/A 11/21/2015   Procedure: COLONOSCOPY WITH PROPOFOL;  Surgeon: Hulen Luster, MD;  Location[facility] ENDOSCOPY;  Service: Gastroenterology;  Laterality: N/A;   Social History   Tobacco Use  . Smoking status: Former Smoker    Packs/day: 0.50    Years: 45.00    Pack years: 22.50    Types: Cigarettes, E-cigarettes    Quit date: 01/22/2016    Years since quitting: 3.6  . Smokeless tobacco: Never Used  . Tobacco comment: also uses e-cigaretts on occasion  Substance Use Topics  . Alcohol use: No    Alcohol/week: 0.0 standard drinks  . Drug use: No   Family History  Problem Relation Age of Onset  . Ovarian cancer Mother   . Heart disease Sister   . Lung cancer Sister   . Lymphoma Father   . Heart disease Father        MI  . Hypertension Father   . Alcohol abuse Brother   . Lung cancer Brother   . Breast cancer Sister 24  . Heart disease Sister   . Heart attack Sister   . Breast cancer Daughter [address]       Theophilus Kinds, LCIS  . Breast cancer Paternal Aunt 69  . Colon cancer Neg Hx    Allergies  Allergen Reactions  . Alendronate Sodium     GI pain  . Atorvastatin     Muscle pain  . Crestor [Rosuvastatin Calcium]     Muscle pain  . Evista [Raloxifene] Other (See Comments)    Breast tenderness  . Sertraline Hcl     Did  not help, made anxiety worse  . Pseudoephedrine Palpitations   Current Outpatient Medications on File Prior to Visit  Medication Sig Dispense Refill  . acetaminophen (TYLENOL) 500 MG tablet Take 500 mg by mouth every 6 (six) hours as needed (for pain.).    Marland Kitchen Ascorbic Acid (VITAMIN C WITH ROSE HIPS) 1000 MG tablet Take 1,000 mg by mouth daily.    . Calcium Carb-Cholecalciferol (CALCIUM 600 + D PO) Take 1 capsule by mouth daily.    . Calcium-Magnesium-Zinc (CAL-MAG-ZINC PO) Take 1 tablet by mouth daily.    . Cholecalciferol (VITAMIN D) 50 MCG (2000 UT) tablet Take 2,000 Units by mouth daily.    . Coconut Oil 1000 MG CAPS Take 1,000 mg by mouth daily.    . Omega-3 Fatty Acids (FISH OIL) 1000 MG CAPS Take 1,000 mg by mouth daily.    . tamoxifen (NOLVADEX) 20 MG tablet Take 1 tablet (20 mg total) by mouth daily. 90 tablet 3   No current facility-administered medications on file prior to visit.     Review of Systems  Constitutional: Negative for activity change, appetite change, fatigue, fever and unexpected weight change.  HENT: Negative for congestion, ear pain, rhinorrhea, sinus pressure and sore throat.   Eyes: Negative for pain, redness and visual disturbance.  Respiratory: Negative for cough, shortness of breath and wheezing.   Cardiovascular: Negative for chest pain and palpitations.       Deconditioned from b ca tx  Getting back to exercise gradually  Gastrointestinal: Negative for abdominal pain, blood in stool, constipation and diarrhea.  Endocrine: Negative for polydipsia and polyuria.  Genitourinary: Negative for dysuria, frequency and urgency.  Musculoskeletal: Negative for arthralgias, back pain and myalgias.  Skin: Negative for pallor and rash.  Allergic/Immunologic: Negative for environmental allergies.  Neurological: Negative for  dizziness, syncope and headaches.  Hematological: Negative for adenopathy. Does not bruise/bleed easily.  Psychiatric/Behavioral: Negative for  decreased concentration and dysphoric mood. The patient is not nervous/anxious.        Objective:   Physical Exam Constitutional:      General: She is not in acute distress.    Appearance: Normal appearance. She is well-developed. She is obese. She is not ill-appearing or diaphoretic.  HENT:     Head: Normocephalic and atraumatic.     Right Ear: Tympanic membrane, ear canal and external ear normal.     Left Ear: Tympanic membrane, ear canal and external ear normal.     Nose: Nose normal. No congestion.     Mouth/Throat:     Mouth: Mucous membranes are moist.     Pharynx: Oropharynx is clear. No posterior oropharyngeal erythema.  Eyes:     General: No scleral icterus.    Extraocular Movements: Extraocular movements intact.     Conjunctiva/sclera: Conjunctivae normal.     Pupils: Pupils are equal, round, and reactive to light.  Neck:     Musculoskeletal: Normal range of motion and neck supple. No neck rigidity or muscular tenderness.     Thyroid: No thyromegaly.     Vascular: No carotid bruit or JVD.  Cardiovascular:     Rate and Rhythm: Normal rate and regular rhythm.     Pulses: Normal pulses.     Heart sounds: Normal heart sounds. No gallop.   Pulmonary:     Effort: Pulmonary effort is normal. No respiratory distress.     Breath sounds: Normal breath sounds. No wheezing.     Comments: Good air exch Chest:     Chest wall: No tenderness.  Abdominal:     General: Bowel sounds are normal. There is no distension or abdominal bruit.     Palpations: Abdomen is soft. There is no mass.     Tenderness: There is no abdominal tenderness.     Hernia: No hernia is present.  Genitourinary:    Comments: Breast exam: No mass, nodules, thickening, tenderness, bulging, retraction, inflamation, nipple discharge or skin changes noted.  No axillary or clavicular LA.     Surgical changes on L noted , skin is more sensitive in L axilla as reported (recent breast cancer tx) Musculoskeletal:  Normal range of motion.        General: No tenderness.     Right lower leg: No edema.     Left lower leg: No edema.  Lymphadenopathy:     Cervical: No cervical adenopathy.  Skin:    General: Skin is warm and dry.     Coloration: Skin is not pale.     Findings: No erythema or rash.  Neurological:     Mental Status: She is alert. Mental status is at baseline.     Cranial Nerves: No cranial nerve deficit.     Motor: No abnormal muscle tone.     Coordination: Coordination normal.     Gait: Gait normal.     Deep Tendon Reflexes: Reflexes are normal and symmetric. Reflexes normal.  Psychiatric:        Mood and Affect: Mood normal.        Cognition and Memory: Cognition and memory normal.           Assessment & Plan:   Problem List Items Addressed This Visit      Cardiovascular and Mediastinum   HYPERTENSION, BENIGN ESSENTIAL    bp in fair control at  this time  BP Readings from Last 1 Encounters:  09/02/19 136/78   No changes needed Most recent labs reviewed  Disc lifstyle change with low sodium diet and exercise        Relevant Medications   hydrochlorothiazide (HYDRODIURIL) 25 MG tablet     Musculoskeletal and Integument   Osteoporosis    dexa 7/20 OP  Intol to alendronate in the past and evista Currently on tamoxifen  Prior smoker  On ca and D ? If prolia will be covered  Given info - she will disc with oncology  No falls or fx         Other   Hyperlipidemia    Disc goals for lipids and reasons to control them Rev last labs with pt Rev low sat fat diet in detail LDL is down  So is HDL (Tamoxifen) Intol to all statins unfortunately      Relevant Medications   hydrochlorothiazide (HYDRODIURIL) 25 MG tablet   Routine general medical examination at a health care facility    Reviewed health habits including diet and exercise and skin cancer prevention Reviewed appropriate screening tests for age  Also reviewed health mt list, fam hx and immunization  status , as well as social and family history   See HPI Labs reviewed  Underwent breast cancer tx  No longer smokes Flu shot is utd for the season  Rev dexa and disc OP tx  Has adv directive No cognitive concerns  Nl hearing screen and vision screen  She is also planning routine opth appt  Dermatology appt for screening is upcoming      Prediabetes    Lab Results  Component Value Date   HGBA1C 5.7 08/27/2019   disc imp of low glycemic diet and wt loss to prevent DM2       Ductal carcinoma in situ (DCIS) of left breast    S/p lumpectomy and radiation  Now on tamoxifen 3 wk  Unsure she will tolerate  Will plan on gyn exam/pap at a later date (she will schedule)  OP will need tx (disc prolia)      Medicare annual wellness visit, subsequent - Primary    Reviewed health habits including diet and exercise and skin cancer prevention Reviewed appropriate screening tests for age  Also reviewed health mt list, fam hx and immunization status , as well as social and family history   See HPI Labs reviewed  Underwent breast cancer tx  No longer smokes Flu shot is utd for the season  Rev dexa and disc OP tx  Has adv directive No cognitive concerns  Nl hearing screen and vision screen  She is also planning routine opth appt

## 2019-09-02 NOTE — Assessment & Plan Note (Signed)
bp in fair control at this time  BP Readings from Last 1 Encounters:  09/02/19 136/78   No changes needed Most recent labs reviewed  Disc lifstyle change with low sodium diet and exercise

## 2019-09-02 NOTE — Assessment & Plan Note (Signed)
Disc goals for lipids and reasons to control them Rev last labs with pt Rev low sat fat diet in detail LDL is down  So is HDL (Tamoxifen) Intol to all statins unfortunately

## 2019-09-02 NOTE — Assessment & Plan Note (Signed)
Lab Results  Component Value Date   HGBA1C 5.7 08/27/2019   disc imp of low glycemic diet and wt loss to prevent DM2

## 2019-09-02 NOTE — Assessment & Plan Note (Signed)
S/p lumpectomy and radiation  Now on tamoxifen 3 wk  Unsure she will tolerate  Will plan on gyn exam/pap at a later date (she will schedule)  OP will need tx (disc prolia)

## 2019-09-02 NOTE — Assessment & Plan Note (Signed)
dexa 7/20 OP  Intol to alendronate in the past and evista Currently on tamoxifen  Prior smoker  On ca and D ? If prolia will be covered  Given info - she will disc with oncology  No falls or fx

## 2019-10-07 DIAGNOSIS — X32XXXA Exposure to sunlight, initial encounter: Secondary | ICD-10-CM | POA: Diagnosis not present

## 2019-10-07 DIAGNOSIS — L570 Actinic keratosis: Secondary | ICD-10-CM | POA: Diagnosis not present

## 2019-10-07 DIAGNOSIS — D485 Neoplasm of uncertain behavior of skin: Secondary | ICD-10-CM | POA: Diagnosis not present

## 2019-10-07 DIAGNOSIS — D0462 Carcinoma in situ of skin of left upper limb, including shoulder: Secondary | ICD-10-CM | POA: Diagnosis not present

## 2019-10-07 DIAGNOSIS — D2262 Melanocytic nevi of left upper limb, including shoulder: Secondary | ICD-10-CM | POA: Diagnosis not present

## 2019-10-07 DIAGNOSIS — D225 Melanocytic nevi of trunk: Secondary | ICD-10-CM | POA: Diagnosis not present

## 2019-10-07 DIAGNOSIS — D2261 Melanocytic nevi of right upper limb, including shoulder: Secondary | ICD-10-CM | POA: Diagnosis not present

## 2019-10-07 DIAGNOSIS — Z85828 Personal history of other malignant neoplasm of skin: Secondary | ICD-10-CM | POA: Diagnosis not present

## 2019-10-20 ENCOUNTER — Other Ambulatory Visit: Payer: Self-pay | Admitting: General Surgery

## 2019-10-20 DIAGNOSIS — D0512 Intraductal carcinoma in situ of left breast: Secondary | ICD-10-CM

## 2019-10-20 DIAGNOSIS — C50312 Malignant neoplasm of lower-inner quadrant of left female breast: Secondary | ICD-10-CM | POA: Diagnosis not present

## 2019-10-20 MED ORDER — LETROZOLE 2.5 MG PO TABS: 2.5000 mg | ORAL_TABLET | Freq: Every day | ORAL | 11 refills | Status: DC

## 2019-11-03 DIAGNOSIS — D0462 Carcinoma in situ of skin of left upper limb, including shoulder: Secondary | ICD-10-CM | POA: Diagnosis not present

## 2019-11-10 ENCOUNTER — Telehealth: Payer: PPO | Admitting: Oncology

## 2019-11-18 ENCOUNTER — Other Ambulatory Visit: Payer: Self-pay

## 2019-11-18 ENCOUNTER — Ambulatory Visit
Admission: RE | Admit: 2019-11-18 | Discharge: 2019-11-18 | Disposition: A | Discharge status: Home / Self Care | Payer: PPO | Source: Ambulatory Visit | Attending: Radiation Oncology | Admitting: Radiation Oncology

## 2019-11-18 ENCOUNTER — Encounter: Payer: Self-pay | Admitting: Radiation Oncology

## 2019-11-18 VITALS — BP 151/93 | HR 96 | Temp 98.3°F | Resp 16 | Wt 186.4 lb

## 2019-11-18 DIAGNOSIS — D0512 Intraductal carcinoma in situ of left breast: Secondary | ICD-10-CM | POA: Diagnosis not present

## 2019-11-18 DIAGNOSIS — Z170 Estrogen receptor positive status [ER+]: Secondary | ICD-10-CM | POA: Diagnosis not present

## 2019-11-18 DIAGNOSIS — Z923 Personal history of irradiation: Secondary | ICD-10-CM | POA: Insufficient documentation

## 2019-11-18 DIAGNOSIS — Z86000 Personal history of in-situ neoplasm of breast: Secondary | ICD-10-CM | POA: Diagnosis not present

## 2019-11-18 NOTE — Progress Notes (Signed)
Radiation Oncology Follow up Note  Name: Erin Hinton   Date:   11/18/2019 MRN:  [id number] DOB: 17-Oct-1946    This 73 y.o. female presents to the clinic today for 38-month follow-up status post whole breast radiation to her left breast for ER/PR positive ductal carcinoma in situ.  REFERRING PROVIDER: Tower, Wynelle Fanny, MD  HPI: Patient is a 73 year old female now out 6 months having completed whole breast radiation to her left breast for ER/PR positive ductal carcinoma in situ.  Seen today in routine follow-up she is doing well.  She still has some persistent tenderness in the left breast and axilla which is secondary to scar tissue.  She specifically [place] cough or bone pain.  She is currently on Lexapro.  She has not had a follow-up mammogram at this point.  COMPLICATIONS OF TREATMENT: none  FOLLOW UP COMPLIANCE: keeps appointments   PHYSICAL EXAM:  BP (!) 151/93 (BP Location: Left Arm)   Pulse 96   Temp 98.3 F (36.8 C) (Tympanic)   Resp 16   Wt 186 lb 6.4 oz (84.6 kg)   BMI 31.02 kg/m  Lungs are clear to A&P cardiac examination essentially unremarkable with regular rate and rhythm. No dominant mass or nodularity is noted in either breast in 2 positions examined. Incision is well-healed. No axillary or supraclavicular adenopathy is appreciated. Cosmetic result is excellent.  Well-developed well-nourished patient in NAD. HEENT reveals PERLA, EOMI, discs not visualized.  Oral cavity is clear. No oral mucosal lesions are identified. Neck is clear without evidence of cervical or supraclavicular adenopathy. Lungs are clear to A&P. Cardiac examination is essentially unremarkable with regular rate and rhythm without murmur rub or thrill. Abdomen is benign with no organomegaly or masses noted. Motor sensory and DTR levels are equal and symmetric in the upper and lower extremities. Cranial nerves II through XII are grossly intact. Proprioception is intact. No peripheral adenopathy or edema is  identified. No motor or sensory levels are noted. Crude visual fields are within normal range.  RADIOLOGY RESULTS: No current films to review  PLAN: Present time she is doing well 6 months out with no evidence of disease.  I am pleased with her overall progress.  I have asked to see her back in 1 year for follow-up.  She is asked me about donating blood I have asked her to contact through the website [employer].  Not sure if DCS disqualifies her.  Otherwise I am pleased with her overall progress.  Patient knows to call with any concerns.  I would like to take this opportunity to thank you for allowing me to participate in the care of your patient.Noreene Filbert, MD

## 2019-11-23 ENCOUNTER — Other Ambulatory Visit: Payer: Self-pay

## 2019-11-23 DIAGNOSIS — D0512 Intraductal carcinoma in situ of left breast: Secondary | ICD-10-CM

## 2019-12-02 ENCOUNTER — Other Ambulatory Visit: Payer: Self-pay | Admitting: General Surgery

## 2019-12-02 DIAGNOSIS — D0512 Intraductal carcinoma in situ of left breast: Secondary | ICD-10-CM

## 2020-01-07 ENCOUNTER — Other Ambulatory Visit: Payer: PPO

## 2020-01-07 ENCOUNTER — Ambulatory Visit: Payer: PPO | Attending: Internal Medicine

## 2020-01-07 DIAGNOSIS — Z23 Encounter for immunization: Secondary | ICD-10-CM | POA: Insufficient documentation

## 2020-01-07 NOTE — Progress Notes (Signed)
   Covid-19 Vaccination Clinic  Name:  Erin Hinton    MRN: [id number] DOB: 09-Dec-1946  01/07/2020  Erin Hinton was observed post Covid-19 immunization for 15 minutes without incidence. She was provided with Vaccine Information Sheet and instruction to access the V-Safe system.   Erin Hinton was instructed to call 911 with any severe reactions post vaccine: Marland Kitchen Difficulty breathing  . Swelling of your face and throat  . A fast heartbeat  . A bad rash all over your body  . Dizziness and weakness    Immunizations Administered    Name Date Dose VIS Date Route   Pfizer COVID-19 Vaccine 01/07/2020  2:06 PM 0.3 mL 11/27/2019 Intramuscular   Manufacturer: [place]   Lot: [id number]   [place]: [id number]

## 2020-01-11 ENCOUNTER — Ambulatory Visit
Admission: RE | Admit: 2020-01-11 | Discharge: 2020-01-11 | Disposition: A | Discharge status: Home / Self Care | Payer: PPO | Source: Ambulatory Visit | Attending: General Surgery | Admitting: General Surgery

## 2020-01-11 DIAGNOSIS — D0512 Intraductal carcinoma in situ of left breast: Secondary | ICD-10-CM | POA: Insufficient documentation

## 2020-01-11 DIAGNOSIS — R928 Other abnormal and inconclusive findings on diagnostic imaging of breast: Secondary | ICD-10-CM | POA: Diagnosis not present

## 2020-01-13 ENCOUNTER — Ambulatory Visit: Payer: PPO | Admitting: Surgery

## 2020-01-19 DIAGNOSIS — D0512 Intraductal carcinoma in situ of left breast: Secondary | ICD-10-CM | POA: Diagnosis not present

## 2020-01-28 ENCOUNTER — Ambulatory Visit: Payer: PPO | Attending: Internal Medicine

## 2020-01-28 DIAGNOSIS — Z23 Encounter for immunization: Secondary | ICD-10-CM | POA: Insufficient documentation

## 2020-01-28 NOTE — Progress Notes (Signed)
   Covid-19 Vaccination Clinic  Name:  DELORIES ZIEMER    MRN: [id number] DOB: September 21, 1946  01/28/2020  Ms. Genco was observed post Covid-19 immunization for 15 minutes without incidence. She was provided with Vaccine Information Sheet and instruction to access the V-Safe system.   Ms. Yannuzzi was instructed to call 911 with any severe reactions post vaccine: Marland Kitchen Difficulty breathing  . Swelling of your face and throat  . A fast heartbeat  . A bad rash all over your body  . Dizziness and weakness    Immunizations Administered    Name Date Dose VIS Date Route   Pfizer COVID-19 Vaccine 01/28/2020  2:49 PM 0.3 mL 11/27/2019 Intramuscular   Manufacturer: [place]   Lot: [id number]   [place]: [id number]

## 2020-04-14 IMAGING — MG MM PLC BREAST LOC DEV 1ST LESION INC MAMMO GUIDE*L*
5 series · 5 of 5 positions shown · non-contrast
Comparison: Previous exams.

CLINICAL DATA: Preoperative wire localization prior to left breast
lumpectomy for high-grade DCIS.

EXAM:
NEEDLE LOCALIZATION OF THE LEFT BREAST WITH MAMMO GUIDANCE

[L CC (1 of 3)]
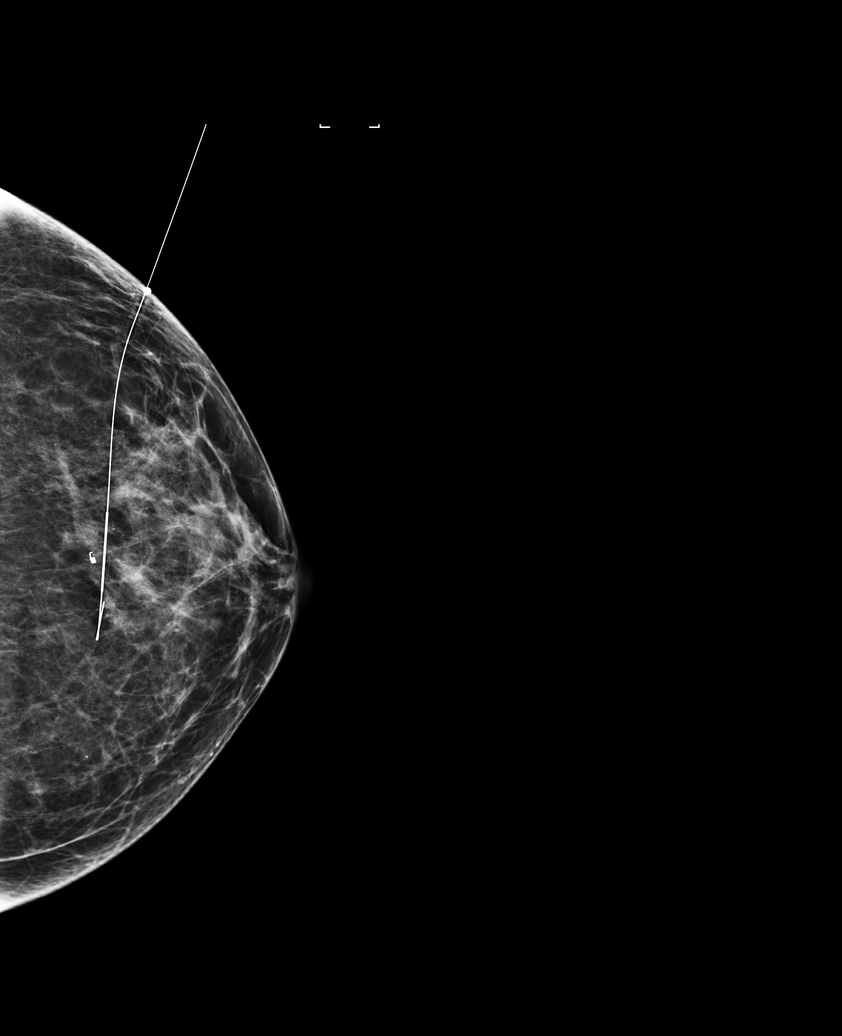

[L CC (2 of 3)]
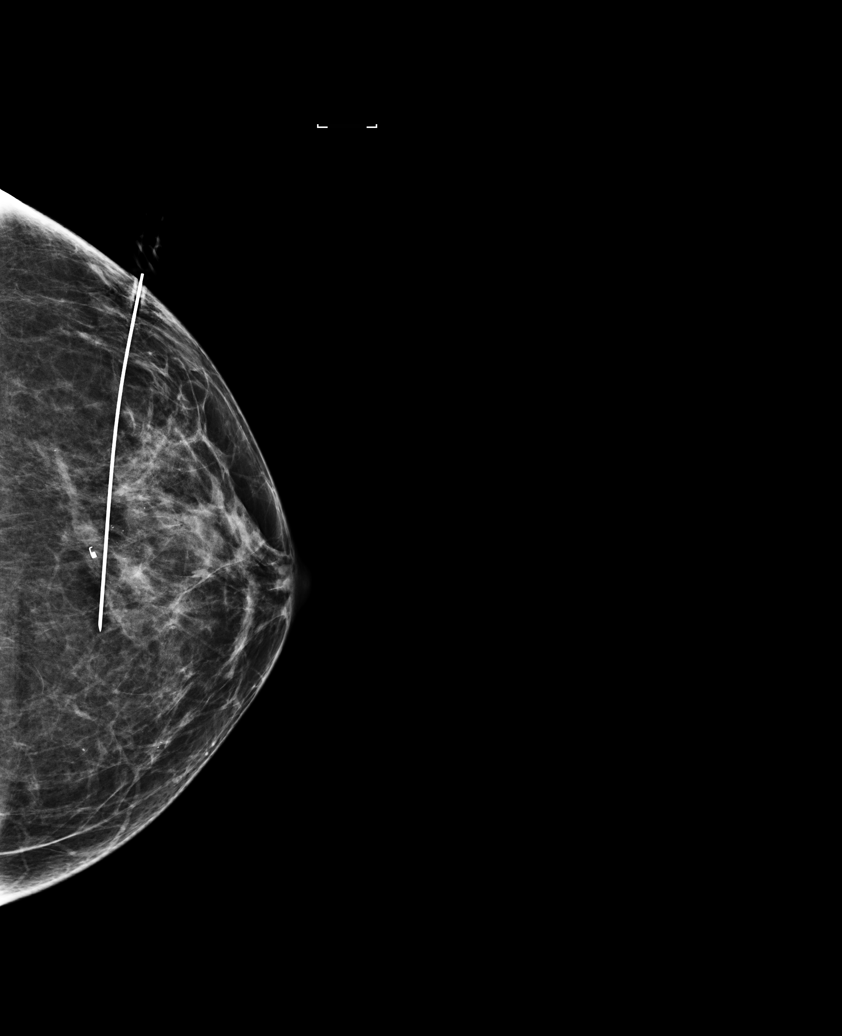

[L CC (3 of 3)]
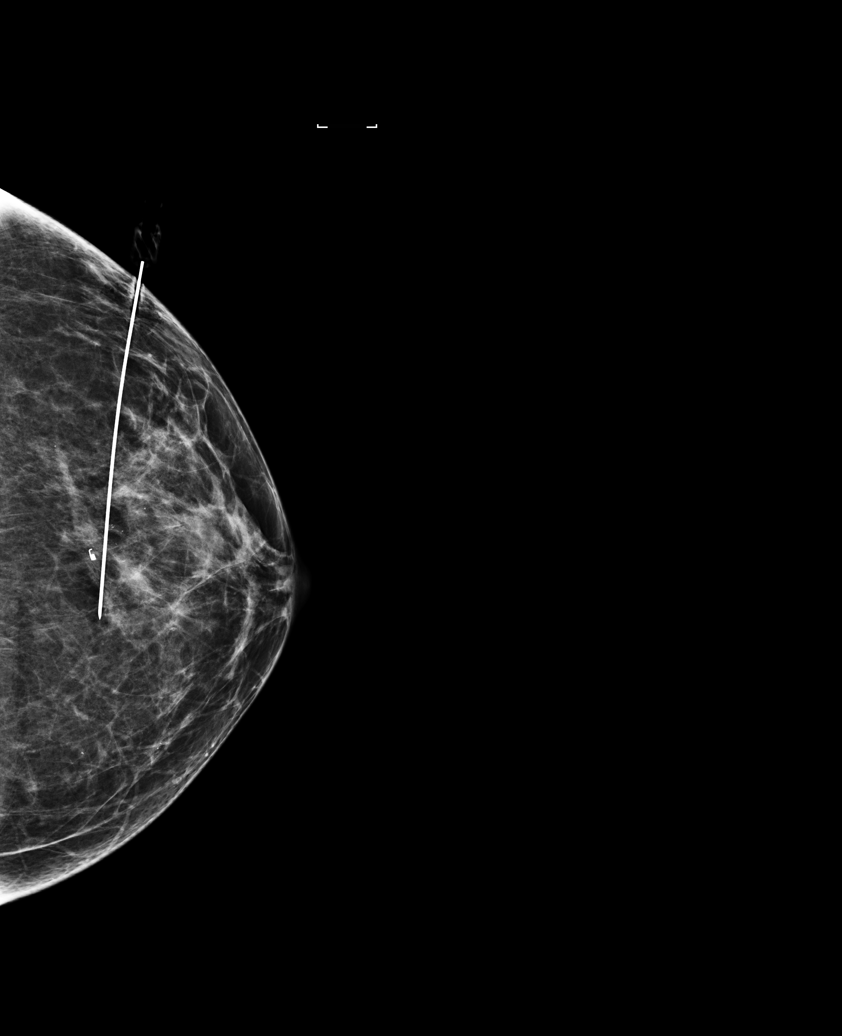

[L LM (1 of 2)]
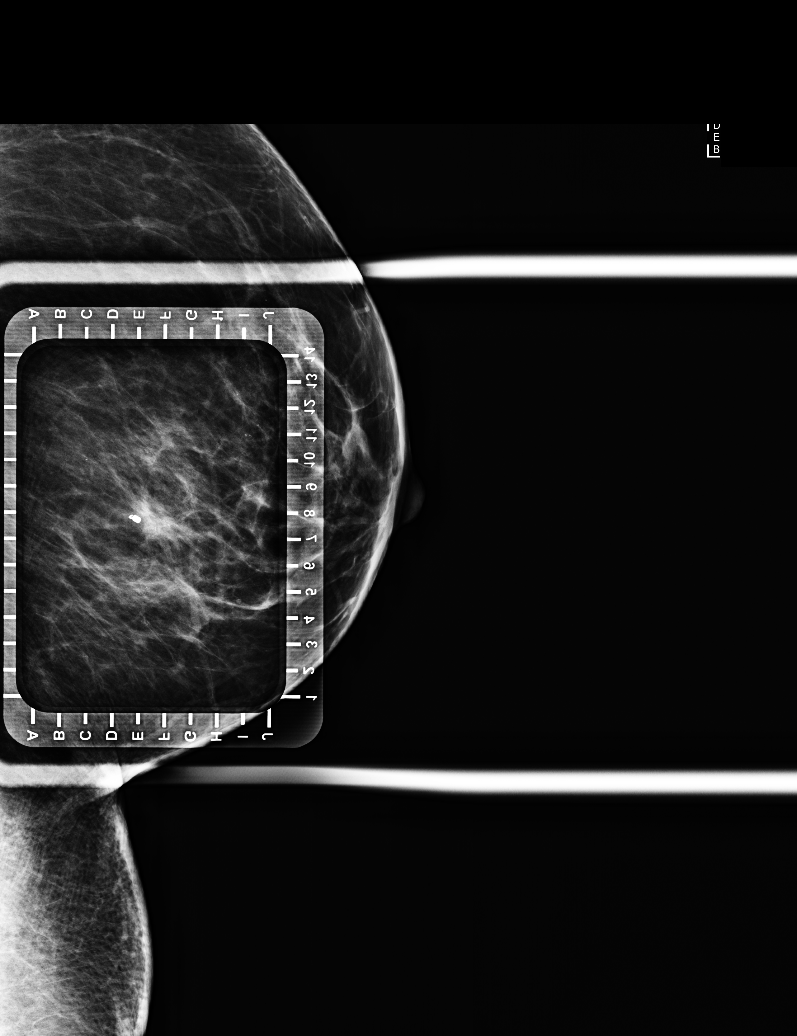

[L LM (2 of 2)]
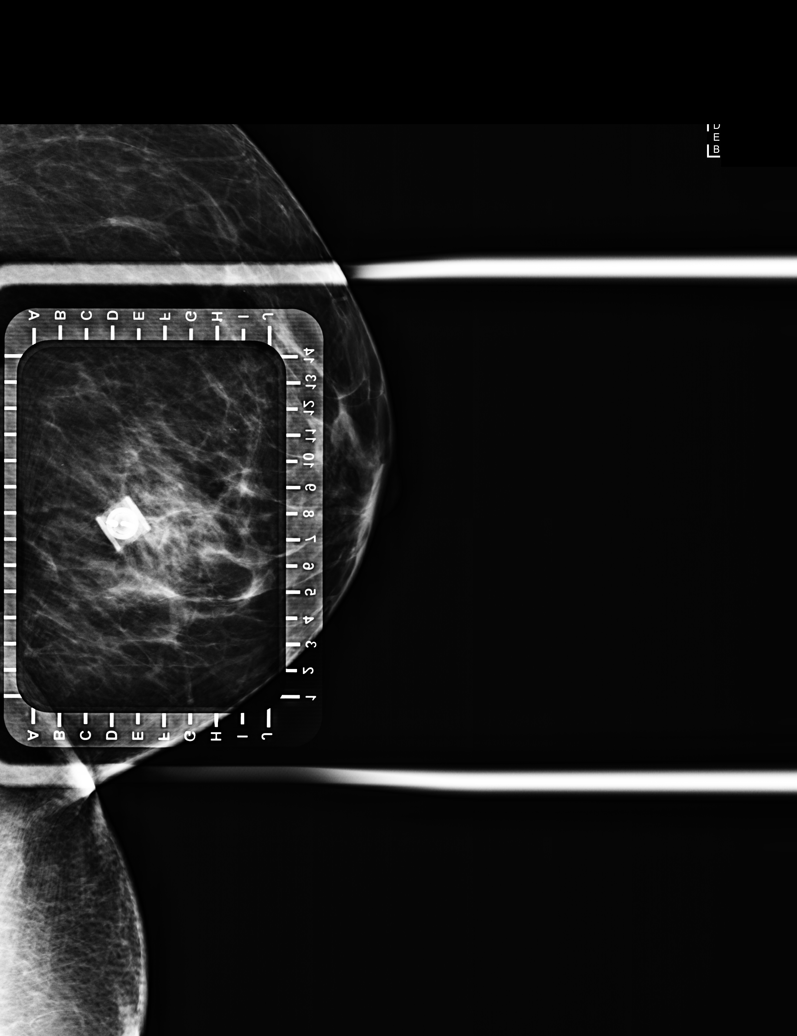

[5 of 5 positions shown; findings below may reference images not displayed]

FINDINGS: Patient presents for needle localization prior to left breast
lumpectomy. I met with the patient and we discussed the procedure of
needle localization including benefits and alternatives. We
discussed the high likelihood of a successful procedure. We
discussed the risks of the procedure, including infection, bleeding,
tissue injury, and further surgery. Informed, written consent was
given. The usual time-out protocol was performed immediately prior
to the procedure.

Using mammographic guidance, sterile technique, 1% lidocaine and a 9
cm modified Kopans needle, coil shaped marker and residual
calcifications were localized using lateral approach.

The residual calcifications are located just anterior to the
reinforced portion of the wire. The images were marked for Dr.
Kazija.
IMPRESSION: Needle localization left breast. No apparent complications.

## 2020-05-02 DIAGNOSIS — L570 Actinic keratosis: Secondary | ICD-10-CM | POA: Diagnosis not present

## 2020-05-02 DIAGNOSIS — Z85828 Personal history of other malignant neoplasm of skin: Secondary | ICD-10-CM | POA: Diagnosis not present

## 2020-05-02 DIAGNOSIS — X32XXXA Exposure to sunlight, initial encounter: Secondary | ICD-10-CM | POA: Diagnosis not present

## 2020-05-02 DIAGNOSIS — D225 Melanocytic nevi of trunk: Secondary | ICD-10-CM | POA: Diagnosis not present

## 2020-05-02 DIAGNOSIS — D0461 Carcinoma in situ of skin of right upper limb, including shoulder: Secondary | ICD-10-CM | POA: Diagnosis not present

## 2020-05-02 DIAGNOSIS — D2262 Melanocytic nevi of left upper limb, including shoulder: Secondary | ICD-10-CM | POA: Diagnosis not present

## 2020-05-02 DIAGNOSIS — D2261 Melanocytic nevi of right upper limb, including shoulder: Secondary | ICD-10-CM | POA: Diagnosis not present

## 2020-05-02 DIAGNOSIS — D2272 Melanocytic nevi of left lower limb, including hip: Secondary | ICD-10-CM | POA: Diagnosis not present

## 2020-05-02 DIAGNOSIS — L821 Other seborrheic keratosis: Secondary | ICD-10-CM | POA: Diagnosis not present

## 2020-05-02 DIAGNOSIS — D485 Neoplasm of uncertain behavior of skin: Secondary | ICD-10-CM | POA: Diagnosis not present

## 2020-05-12 DIAGNOSIS — M3501 Sicca syndrome with keratoconjunctivitis: Secondary | ICD-10-CM | POA: Diagnosis not present

## 2020-06-01 DIAGNOSIS — D0461 Carcinoma in situ of skin of right upper limb, including shoulder: Secondary | ICD-10-CM | POA: Diagnosis not present

## 2020-07-12 ENCOUNTER — Other Ambulatory Visit: Payer: Self-pay

## 2020-08-26 DIAGNOSIS — Z20822 Contact with and (suspected) exposure to covid-19: Secondary | ICD-10-CM | POA: Diagnosis not present

## 2020-09-07 ENCOUNTER — Other Ambulatory Visit: Payer: Self-pay

## 2020-09-07 ENCOUNTER — Ambulatory Visit (INDEPENDENT_AMBULATORY_CARE_PROVIDER_SITE_OTHER): Payer: PPO

## 2020-09-07 DIAGNOSIS — Z0000 Encounter for general adult medical examination without abnormal findings: Secondary | ICD-10-CM | POA: Diagnosis not present

## 2020-09-07 NOTE — Progress Notes (Signed)
PCP notes:  Health Maintenance: No gaps noted  Abnormal Screenings: none   Patient concerns: Trouble sleeping   Nurse concerns: none   Next PCP appt.: 09/19/2020 @ 11:30 am

## 2020-09-07 NOTE — Patient Instructions (Addendum)
[name] , Thank you for taking time to come for your Medicare Wellness Visit. I appreciate your ongoing commitment to your health goals. Please review the following plan we discussed and let me know if I can assist you in the future.   Screening recommendations/referrals: Colonoscopy: Up to date, completed 11/21/2015, due 11/2025 Mammogram: Up to date, completed 01/11/2020, due 12/2020 Bone Density: Up to date, completed 07/01/2019, due 06/2021 Recommended yearly ophthalmology/optometry visit for glaucoma screening and checkup Recommended yearly dental visit for hygiene and checkup  Vaccinations: Influenza vaccine: Up to date, completed 08/26/2020, due 07/2021 Pneumococcal vaccine: Completed series Tdap vaccine: Up to date, completed 08/26/2020, due 08/2030 Shingles vaccine: Completed series   Covid-19:Completed series  Advanced directives: Please bring a copy of your POA (Power of [place]) and/or Living Will to your next appointment.   Conditions/risks identified: hypertension, hyperlipidemia  Next appointment: Follow up in one year for your annual wellness visit    Preventive Care 7 Years and Older, Female Preventive care refers to lifestyle choices and visits with your health care provider that can promote health and wellness. What does preventive care include?  A yearly physical exam. This is also called an annual well check.  Dental exams once or twice a year.  Routine eye exams. Ask your health care provider how often you should have your eyes checked.  Personal lifestyle choices, including:  Daily care of your teeth and gums.  Regular physical activity.  Eating a healthy diet.  Avoiding tobacco and drug use.  Limiting alcohol use.  Practicing safe sex.  Taking low-dose aspirin every day.  Taking vitamin and mineral supplements as recommended by your health care provider. What happens during an annual well check? The services and screenings done by your health  care provider during your annual well check will depend on your age, overall health, lifestyle risk factors, and family history of disease. Counseling  Your health care provider may ask you questions about your:  Alcohol use.  Tobacco use.  Drug use.  Emotional well-being.  Home and relationship well-being.  Sexual activity.  Eating habits.  History of falls.  Memory and ability to understand (cognition).  Work and work Statistician.  Reproductive health. Screening  You may have the following tests or measurements:  Height, weight, and BMI.  Blood pressure.  Lipid and cholesterol levels. These may be checked every 5 years, or more frequently if you are over 5 years old.  Skin check.  Lung cancer screening. You may have this screening every year starting at age 43 if you have a 30-pack-year history of smoking and currently smoke or have quit within the past 15 years.  Fecal occult blood test (FOBT) of the stool. You may have this test every year starting at age 23.  Flexible sigmoidoscopy or colonoscopy. You may have a sigmoidoscopy every 5 years or a colonoscopy every 10 years starting at age 48.  Hepatitis C blood test.  Hepatitis B blood test.  Sexually transmitted disease (STD) testing.  Diabetes screening. This is done by checking your blood sugar (glucose) after you have not eaten for a while (fasting). You may have this done every 1-3 years.  Bone density scan. This is done to screen for osteoporosis. You may have this done starting at age 60.  Mammogram. This may be done every 1-2 years. Talk to your health care provider about how often you should have regular mammograms. Talk with your health care provider about your test results, treatment options, and  if necessary, the need for more tests. Vaccines  Your health care provider may recommend certain vaccines, such as:  Influenza vaccine. This is recommended every year.  Tetanus, diphtheria, and  acellular pertussis (Tdap, Td) vaccine. You may need a Td booster every 10 years.  Zoster vaccine. You may need this after age 75.  Pneumococcal 13-valent conjugate (PCV13) vaccine. One dose is recommended after age 49.  Pneumococcal polysaccharide (PPSV23) vaccine. One dose is recommended after age 30. Talk to your health care provider about which screenings and vaccines you need and how often you need them. This information is not intended to replace advice given to you by your health care provider. Make sure you discuss any questions you have with your health care provider. Document Released: 12/30/2015 Document Revised: 08/22/2016 Document Reviewed: 10/04/2015 Elsevier Interactive Patient Education  2017 [place] Prevention in the Home Falls can cause injuries. They can happen to people of all ages. There are many things you can do to make your home safe and to help prevent falls. What can I do on the outside of my home?  Regularly fix the edges of walkways and driveways and fix any cracks.  Remove anything that might make you trip as you walk through a door, such as a raised step or threshold.  Trim any bushes or trees on the path to your home.  Use bright outdoor lighting.  Clear any walking paths of anything that might make someone trip, such as rocks or tools.  Regularly check to see if handrails are loose or broken. Make sure that both sides of any steps have handrails.  Any raised decks and porches should have guardrails on the edges.  Have any leaves, snow, or ice cleared regularly.  Use sand or salt on walking paths during winter.  Clean up any spills in your garage right away. This includes oil or grease spills. What can I do in the bathroom?  Use night lights.  Install grab bars by the toilet and in the tub and shower. Do not use towel bars as grab bars.  Use non-skid mats or decals in the tub or shower.  If you need to sit down in the shower, use  a plastic, non-slip stool.  Keep the floor dry. Clean up any water that spills on the floor as soon as it happens.  Remove soap buildup in the tub or shower regularly.  Attach bath mats securely with double-sided non-slip rug tape.  Do not have throw rugs and other things on the floor that can make you trip. What can I do in the bedroom?  Use night lights.  Make sure that you have a light by your bed that is easy to reach.  Do not use any sheets or blankets that are too big for your bed. They should not hang down onto the floor.  Have a firm chair that has side arms. You can use this for support while you get dressed.  Do not have throw rugs and other things on the floor that can make you trip. What can I do in the kitchen?  Clean up any spills right away.  Avoid walking on wet floors.  Keep items that you use a lot in easy-to-reach places.  If you need to reach something above you, use a strong step stool that has a grab bar.  Keep electrical cords out of the way.  Do not use floor polish or wax that makes floors slippery. If you  must use wax, use non-skid floor wax.  Do not have throw rugs and other things on the floor that can make you trip. What can I do with my stairs?  Do not leave any items on the stairs.  Make sure that there are handrails on both sides of the stairs and use them. Fix handrails that are broken or loose. Make sure that handrails are as long as the stairways.  Check any carpeting to make sure that it is firmly attached to the stairs. Fix any carpet that is loose or worn.  Avoid having throw rugs at the top or bottom of the stairs. If you do have throw rugs, attach them to the floor with carpet tape.  Make sure that you have a light switch at the top of the stairs and the bottom of the stairs. If you do not have them, ask someone to add them for you. What else can I do to help prevent falls?  Wear shoes that:  Do not have high heels.  Have  rubber bottoms.  Are comfortable and fit you well.  Are closed at the toe. Do not wear sandals.  If you use a stepladder:  Make sure that it is fully opened. Do not climb a closed stepladder.  Make sure that both sides of the stepladder are locked into place.  Ask someone to hold it for you, if possible.  Clearly mark and make sure that you can see:  Any grab bars or handrails.  First and last steps.  Where the edge of each step is.  Use tools that help you move around (mobility aids) if they are needed. These include:  Canes.  Walkers.  Scooters.  Crutches.  Turn on the lights when you go into a dark area. Replace any light bulbs as soon as they burn out.  Set up your furniture so you have a clear path. Avoid moving your furniture around.  If any of your floors are uneven, fix them.  If there are any pets around you, be aware of where they are.  Review your medicines with your doctor. Some medicines can make you feel dizzy. This can increase your chance of falling. Ask your doctor what other things that you can do to help prevent falls. This information is not intended to replace advice given to you by your health care provider. Make sure you discuss any questions you have with your health care provider. Document Released: 09/29/2009 Document Revised: 05/10/2016 Document Reviewed: 01/07/2015 Elsevier Interactive Patient Education  2017 [employer].

## 2020-09-07 NOTE — Progress Notes (Addendum)
Subjective:   Erin Hinton is a 74 y.o. female who presents for Medicare Annual (Subsequent) preventive examination.  Review of Systems: N/A      I connected with the patient today by telephone and verified that I am speaking with the correct person using two identifiers. Location patient: home Location nurse: work Persons participating in the telephone visit: patient, nurse.   I discussed the limitations, risks, security and privacy concerns of performing an evaluation and management service by telephone and the availability of in person appointments. I also discussed with the patient that there may be a patient responsible charge related to this service. The patient expressed understanding and verbally consented to this telephonic visit.        Cardiac Risk Factors include: advanced age (>16men, >46 women);hypertension;Other (see comment), Risk factor comments: hyperlipidemia     Objective:    Today's Vitals   There is no height or weight on file to calculate BMI.  Advanced Directives 09/07/2020 11/18/2019 08/06/2019 06/15/2019 03/25/2019 02/09/2019 08/21/2018  Does Patient Have a Medical Advance Directive? Yes No Yes No No Yes Yes  Type of Paramedic of [place];Living will - Living will - - Living will [place];Living will  Does patient want to make changes to medical advance directive? - - No - Patient declined - - No - Patient declined -  Copy of [place] in Chart? No - copy requested - No - copy requested - - - No - copy requested  Would patient like information on creating a medical advance directive? - No - Patient declined - No - Patient declined No - Patient declined - -    Current Medications (verified) Outpatient Encounter Medications as of 09/07/2020  Medication Sig  . acetaminophen (TYLENOL) 500 MG tablet Take 500 mg by mouth every 6 (six) hours as needed (for pain.).  Marland Kitchen Ascorbic Acid (VITAMIN C WITH  ROSE HIPS) 1000 MG tablet Take 1,000 mg by mouth daily.  . Calcium Carb-Cholecalciferol (CALCIUM 600 + D PO) Take 1 capsule by mouth daily.  . Calcium-Magnesium-Zinc (CAL-MAG-ZINC PO) Take 1 tablet by mouth daily.  . Cholecalciferol (VITAMIN D) 50 MCG (2000 UT) tablet Take 2,000 Units by mouth daily.  . Coconut Oil 1000 MG CAPS Take 1,000 mg by mouth daily.  Marland Kitchen escitalopram (LEXAPRO) 20 MG tablet Take 1 tablet (20 mg total) by mouth daily.  . fluticasone (FLONASE) 50 MCG/ACT nasal spray Place 2 sprays into both nostrils daily as needed for allergies.  . hydrochlorothiazide (HYDRODIURIL) 25 MG tablet Take 1 tablet (25 mg total) by mouth daily.  Marland Kitchen letrozole (FEMARA) 2.5 MG tablet Take 1 tablet (2.5 mg total) by mouth daily.  . Omega-3 Fatty Acids (FISH OIL) 1000 MG CAPS Take 1,000 mg by mouth daily.  . tamoxifen (NOLVADEX) 20 MG tablet Take 1 tablet (20 mg total) by mouth daily.   No facility-administered encounter medications on file as of 09/07/2020.    Allergies (verified) Alendronate sodium, Atorvastatin, Crestor [rosuvastatin calcium], Evista [raloxifene], Sertraline hcl, and Pseudoephedrine   History: Past Medical History:  Diagnosis Date  . Anxiety   . Breast cancer [facility]) 1996   Right breast, Dr Faylene Million, DCIS, lumpectomy only  . Cancer ([place])    skin  . 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 ER positive: 51-90%  . Depression   . Heart murmur    in her 30's and it is not  a problem now  . Hyperlipidemia   . Hyperlipidemia   . Hypertension   . Osteopenia   . Personal history of radiation therapy   . Shortness of breath dyspnea    due to excercise only   Past Surgical History:  Procedure Laterality Date  . BREAST BIOPSY Left 01/27/2019   Affirm Bx- Coil Clip- HIGH-GRADE DUCTAL CARCINOMA IN SITU, WITH COMEDONECROSIS.  Marland Kitchen BREAST EXCISIONAL BIOPSY Right 01/2002   neg. no lymph node involvement  . BREAST EXCISIONAL BIOPSY Left  03/02/2019   lumpectomy DCIS  . BREAST LUMPECTOMY Left 03/02/2019   left lumpectomy dcis with NL  . BREAST LUMPECTOMY Left 03/02/2019   Procedure: BREAST WIDE EXCISION LEFT, WITH WIRE LOCATION;  Surgeon: Robert Bellow, MD;  Location: [facility] ORS;  Service: General;  Laterality: Left;  . 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 CATARACT EXTRACTION W/PHACO Right 05/01/2016   Procedure: CATARACT EXTRACTION PHACO AND INTRAOCULAR LENS PLACEMENT (IOC);  Surgeon: Birder Robson, MD;  Location: [facility] ORS;  Service: Ophthalmology;  Laterality: Right;  [place] 00:53 AP% 21.7 CDE 11.50 fluid pack lot # [id number]  . CATARACT EXTRACTION W/PHACO Left 06/05/2016   Procedure: CATARACT EXTRACTION PHACO AND INTRAOCULAR LENS PLACEMENT ([place]);  Surgeon: Birder Robson, MD;  Location: [facility] ORS;  Service: Ophthalmology;  Laterality: Left;  [place] 01:04 AP% 22.2 CDE 14.34 fluid pack lot # [id number]  . COLONOSCOPY WITH PROPOFOL N/A 11/21/2015   Procedure: COLONOSCOPY WITH PROPOFOL;  Surgeon: Hulen Luster, MD;  Location: [facility] ENDOSCOPY;  Service: Gastroenterology;  Laterality: N/A;   Family History  Problem Relation Age of Onset  . Ovarian cancer Mother   . Heart disease Sister   . Lung cancer Sister   . Lymphoma Father   . Heart disease Father        MI  . Hypertension Father   . Alcohol abuse Brother   . Lung cancer Brother   . Breast cancer Sister 40  . Heart disease Sister   . Heart attack Sister   . Breast cancer Daughter [address]       Theophilus Kinds, LCIS  . Breast cancer Paternal Aunt 76  . Colon cancer Neg Hx    Social History   Socioeconomic History  . Marital status: Widowed    Spouse name: Not on file  . Number of children: Not on file  . Years of education: Not on file  . Highest education level: Not on file  Occupational History  . Not on file  Tobacco Use  . Smoking status: Former Smoker    Packs/day: 0.50    Years: 45.00    Pack years: 22.50    Types: Cigarettes, E-cigarettes    Quit  date: 01/22/2016    Years since quitting: 4.6  . Smokeless tobacco: Never Used  . Tobacco comment: also uses e-cigaretts on occasion  Vaping Use  . Vaping Use: Some days  . Substances: Nicotine, Flavoring  Substance and Sexual Activity  . Alcohol use: No    Alcohol/week: 0.0 standard drinks  . Drug use: No  . Sexual activity: Never  Other Topics Concern  . Not on file  Social History Narrative  . Not on file   Social Determinants of Health   Financial Resource Strain: Low Risk   . Difficulty of Paying Living Expenses: Not hard at all  Food Insecurity: No Food Insecurity  . Worried About Charity fundraiser in the Last Year: Never true  . Ran Out  of Food in the Last Year: Never true  Transportation Needs: No Transportation Needs  . Lack of Transportation (Medical): No  . Lack of Transportation (Non-Medical): No  Physical Activity: Inactive  . Days of Exercise per Week: 0 days  . Minutes of Exercise per Session: 0 min  Stress: Stress Concern Present  . Feeling of Stress : To some extent  Social Connections:   . Frequency of Communication with Friends and Family: Not on file  . Frequency of Social Gatherings with Friends and Family: Not on file  . Attends Religious Services: Not on file  . Active Member of Clubs or Organizations: Not on file  . Attends Archivist Meetings: Not on file  . Marital Status: Not on file    Tobacco Counseling Counseling given: Not Answered Comment: also uses e-cigaretts on occasion   Clinical Intake:  Pre-visit preparation completed: Yes  Pain : No/denies pain     Nutritional Risks: None Diabetes: No  How often do you need to have someone help you when you read instructions, pamphlets, or other written materials from your doctor or pharmacy?: 1 - Never What is the last grade level you completed in school?: 1 year of college  Diabetic: No Nutrition Risk Assessment:  Has the patient had any N/V/D within the last 2 months?   No  Does the patient have any non-healing wounds?  No  Has the patient had any unintentional weight loss or weight gain?  No   Diabetes:  Is the patient diabetic?  No  If diabetic, was a CBG obtained today?  N/A Did the patient bring in their glucometer from home?  N/A How often do you monitor your CBG's? N/A.   Financial Strains and Diabetes Management:  Are you having any financial strains with the device, your supplies or your medication? N/A.  Does the patient want to be seen by Chronic Care Management for management of their diabetes?  N/A Would the patient like to be referred to a Nutritionist or for Diabetic Management?  N/A     Interpreter Needed?: No  Information entered by :: CJohnson, LPN   Activities of Daily Living In your present state of health, do you have any difficulty performing the following activities: 09/07/2020  Hearing? N  Vision? N  Difficulty concentrating or making decisions? N  Walking or climbing stairs? N  Dressing or bathing? N  Doing errands, shopping? N  Preparing Food and eating ? N  Using the Toilet? N  In the past six months, have you accidently leaked urine? Y  Comment wears a liner sometimes  Do you have problems with loss of bowel control? N  Managing your Medications? N  Managing your Finances? N  Housekeeping or managing your Housekeeping? N  Some recent data might be hidden    Patient Care Team: Tower, Wynelle Fanny, MD as PCP - General Birder Robson, MD as Referring Physician (Ophthalmology)  Indicate any recent Medical Services you may have received from other than [facility] providers in the past year (date may be approximate).     Assessment:   This is a routine wellness examination for [place].  Hearing/Vision screen  Hearing Screening   125Hz  250Hz  500Hz  1000Hz  2000Hz  3000Hz  4000Hz  6000Hz  8000Hz   Right ear:           Left ear:           Vision Screening Comments: Patient gets annual eye exams  Dietary issues and  exercise activities discussed: Current Exercise  Habits: The patient does not participate in regular exercise at present, Exercise limited by: None identified  Goals    . Patient Stated     Starting 08/21/2018, I will continue to take medications as prescribed.     . Patient Stated     09/07/2020, I will maintain and continue medications as prescribed. I will try to start back walking soon.      Depression Screen PHQ 2/9 Scores 09/07/2020 09/02/2019 08/21/2018 07/10/2017 06/26/2016 06/26/2016 05/17/2015  PHQ - 2 Score 0 0 0 1 0 0 0  PHQ- 9 Score 0 - 0 - - - -    Fall Risk Fall Risk  09/07/2020 09/02/2019 03/10/2019 02/03/2019 08/21/2018  Falls in the past year? 0 0 0 0 No  Number falls in past yr: 0 0 - 0 -  Injury with Fall? 0 0 - - -  Risk for fall due to : Medication side effect - - - -  Follow up Falls evaluation completed;Falls prevention discussed Falls evaluation completed - Falls evaluation completed -    Any stairs in or around the home? Yes  If so, are there any without handrails? No  Home free of loose throw rugs in walkways, pet beds, electrical cords, etc? Yes  Adequate lighting in your home to reduce risk of falls? Yes   ASSISTIVE DEVICES UTILIZED TO PREVENT FALLS:  Life alert? No  Use of a cane, walker or w/c? No  Grab bars in the bathroom? No  Shower chair or bench in shower? No  Elevated toilet seat or a handicapped toilet? No   TIMED UP AND GO:  Was the test performed? N/A, telephonic visit.   Cognitive Function: MMSE - Mini Mental State Exam 09/07/2020 08/21/2018 07/10/2017 06/26/2016  Orientation to time 5 5 5 5   Orientation to Place 5 5 5 5   Registration 3 3 3 3   Attention/ Calculation 5 0 0 0  Recall 3 3 2 3   Language- name 2 objects - 0 0 0  Language- repeat 1 1 1 1   Language- follow 3 step command - 3 3 3   Language- read & follow direction - 0 0 0  Write a sentence - 0 0 0  Copy design - 0 0 0  Total score - 20 19 20   Mini Cog  Mini-Cog screen was  completed. Maximum score is 22. A value of 0 denotes this part of the MMSE was not completed or the patient failed this part of the Mini-Cog screening.       Immunizations Immunization History  Administered Date(s) Administered  . Fluad Quad(high Dose 65+) 08/26/2019  . Influenza Whole 09/16/2008, 11/10/2009, 09/26/2010  . Influenza, High Dose Seasonal PF 09/11/2014, 09/21/2015, 07/23/2016, 08/20/2017, 09/17/2018, 08/26/2020  . Influenza-Unspecified 09/16/2013  . PFIZER SARS-COV-2 Vaccination 01/07/2020, 01/28/2020  . Pneumococcal Conjugate-13 05/17/2015  . Pneumococcal Polysaccharide-23 03/24/2004, 11/24/2009, 04/07/2014  . Td 09/09/2006, 09/14/2009  . Tdap 08/26/2020  . Zoster 12/06/2010  . Zoster Recombinat (Shingrix) 05/30/2020, 08/01/2020    TDAP status: up to date Flu Vaccine status: Up to date Pneumococcal vaccine status: Up to date Covid-19 vaccine status: Completed vaccines  Qualifies for Shingles Vaccine? Yes   Zostavax completed Yes   Shingrix Completed?: Yes  Screening Tests Health Maintenance  Topic Date Due  . MAMMOGRAM  01/10/2021  . COLONOSCOPY  11/20/2025  . TETANUS/TDAP  08/26/2030  . INFLUENZA VACCINE  Completed  . DEXA SCAN  Completed  . COVID-19 Vaccine  Completed  . Hepatitis C Screening  Completed  . PNA vac Low Risk Adult  Completed    Health Maintenance  There are no preventive care reminders to display for this patient.  Colorectal cancer screening: Completed 11/21/2015. Repeat every 10 years Mammogram status: Completed 01/11/2020. Repeat every year Bone Density status: Completed 07/01/2019. Results reflect: Bone density results: OSTEOPOROSIS. Repeat every 2 years.  Lung Cancer Screening: (Low Dose CT Chest recommended if Age 41-80 years, 30 pack-year currently smoking OR have quit w/in 15 years.) does not qualify.    Additional Screening:  Hepatitis C Screening: does qualify; Completed 06/26/2016  Vision Screening: Recommended annual  ophthalmology exams for early detection of glaucoma and other disorders of the eye. Is the patient up to date with their annual eye exam?  Yes  Who is the provider or what is the name of the office in which the patient attends annual eye exams? [facility], Dr. Linton Flemings If pt is not established with a provider, would they like to be referred to a provider to establish care? No .   Dental Screening: Recommended annual dental exams for proper oral hygiene  Community Resource Referral / Chronic Care Management: CRR required this visit?  No   CCM required this visit?  No      Plan:     I have personally reviewed and noted the following in the patient's chart:   . Medical and social history . Use of alcohol, tobacco or illicit drugs  . Current medications and supplements . Functional ability and status . Nutritional status . Physical activity . Advanced directives . List of other physicians . Hospitalizations, surgeries, and ER visits in previous 12 months . Vitals . Screenings to include cognitive, depression, and falls . Referrals and appointments  In addition, I have reviewed and discussed with patient certain preventive protocols, quality metrics, and best practice recommendations. A written personalized care plan for preventive services as well as general preventive health recommendations were provided to patient.   Due to this being a telephonic visit, the after visit summary with patients personalized plan was offered to patient via office or my-chart. Patient preferred to pick up at office at next visit or via mychart.   Andrez Grime, LPN   4/31/5400

## 2020-09-08 ENCOUNTER — Telehealth: Payer: Self-pay | Admitting: Family Medicine

## 2020-09-08 DIAGNOSIS — E7800 Pure hypercholesterolemia, unspecified: Secondary | ICD-10-CM

## 2020-09-08 DIAGNOSIS — M810 Age-related osteoporosis without current pathological fracture: Secondary | ICD-10-CM

## 2020-09-08 DIAGNOSIS — R7303 Prediabetes: Secondary | ICD-10-CM

## 2020-09-08 DIAGNOSIS — I10 Essential (primary) hypertension: Secondary | ICD-10-CM

## 2020-09-08 NOTE — Telephone Encounter (Signed)
-----   Message from Cloyd Stagers, RT sent at 08/25/2020 10:02 AM EDT ----- Regarding: Lab Orders for Friday 9.24.2021 Please place lab orders for Friday 9.24.2021, office visit for physical on Wednesday 9.29.2021 Thank you, Dyke Maes RT(R)

## 2020-09-09 ENCOUNTER — Other Ambulatory Visit: Payer: Self-pay

## 2020-09-09 ENCOUNTER — Other Ambulatory Visit (INDEPENDENT_AMBULATORY_CARE_PROVIDER_SITE_OTHER): Payer: PPO

## 2020-09-09 DIAGNOSIS — M810 Age-related osteoporosis without current pathological fracture: Secondary | ICD-10-CM | POA: Diagnosis not present

## 2020-09-09 DIAGNOSIS — E7800 Pure hypercholesterolemia, unspecified: Secondary | ICD-10-CM | POA: Diagnosis not present

## 2020-09-09 DIAGNOSIS — I10 Essential (primary) hypertension: Secondary | ICD-10-CM | POA: Diagnosis not present

## 2020-09-09 DIAGNOSIS — R7303 Prediabetes: Secondary | ICD-10-CM | POA: Diagnosis not present

## 2020-09-09 LAB — LIPID PANEL
Cholesterol: 212 mg/dL — ABNORMAL HIGH (ref 0–200)
HDL: 56.9 mg/dL (ref >39.00)
LDL Cholesterol: 140 mg/dL — ABNORMAL HIGH (ref 0–99)
NonHDL: 155.55
Total CHOL/HDL Ratio: 4
Triglycerides: 79 mg/dL (ref 0.0–149.0)
VLDL: 15.8 mg/dL (ref 0.0–40.0)

## 2020-09-09 LAB — COMPREHENSIVE METABOLIC PANEL
ALT: 9 U/L (ref 0–35)
AST: 14 U/L (ref 0–37)
Albumin: 4.1 g/dL (ref 3.5–5.2)
Alkaline Phosphatase: 54 U/L (ref 39–117)
BUN: 21 mg/dL (ref 6–23)
CO2: 33 mEq/L — ABNORMAL HIGH (ref 19–32)
Calcium: 9.4 mg/dL (ref 8.4–10.5)
Chloride: 104 mEq/L (ref 96–112)
Creatinine, Ser: 0.8 mg/dL (ref 0.40–1.20)
GFR: 70.49 mL/min (ref >60.00)
Glucose, Bld: 94 mg/dL (ref 70–99)
Potassium: 4.1 mEq/L (ref 3.5–5.1)
Sodium: 142 mEq/L (ref 135–145)
Total Bilirubin: 0.6 mg/dL (ref 0.2–1.2)
Total Protein: 6.3 g/dL (ref 6.0–8.3)

## 2020-09-09 LAB — CBC WITH DIFFERENTIAL/PLATELET
Basophils Absolute: 0 10*3/uL (ref 0.0–0.1)
Basophils Relative: 0.6 % (ref 0.0–3.0)
Eosinophils Absolute: 0.2 10*3/uL (ref 0.0–0.7)
Eosinophils Relative: 4.4 % (ref 0.0–5.0)
HCT: 39.2 % (ref 36.0–46.0)
Hemoglobin: 13 g/dL (ref 12.0–15.0)
Lymphocytes Relative: 19.8 % (ref 12.0–46.0)
Lymphs Abs: 0.7 10*3/uL (ref 0.7–4.0)
MCHC: 33.1 g/dL (ref 30.0–36.0)
MCV: 99.4 fl (ref 78.0–100.0)
Monocytes Absolute: 0.4 10*3/uL (ref 0.1–1.0)
Monocytes Relative: 11.1 % (ref 3.0–12.0)
Neutro Abs: 2.3 10*3/uL (ref 1.4–7.7)
Neutrophils Relative %: 64.1 % (ref 43.0–77.0)
Platelets: 203 10*3/uL (ref 150.0–400.0)
RBC: 3.94 Mil/uL (ref 3.87–5.11)
RDW: 13.2 % (ref 11.5–15.5)
WBC: 3.7 10*3/uL — ABNORMAL LOW (ref 4.0–10.5)

## 2020-09-09 LAB — VITAMIN D 25 HYDROXY (VIT D DEFICIENCY, FRACTURES): VITD: 25.05 ng/mL — ABNORMAL LOW (ref 30.00–100.00)

## 2020-09-09 LAB — HEMOGLOBIN A1C: Hgb A1c MFr Bld: 5.7 % (ref 4.6–6.5)

## 2020-09-09 LAB — TSH: TSH: 1.85 u[IU]/mL (ref 0.35–4.50)

## 2020-09-14 ENCOUNTER — Ambulatory Visit: Payer: PPO | Admitting: Family Medicine

## 2020-09-19 ENCOUNTER — Other Ambulatory Visit: Payer: Self-pay

## 2020-09-19 ENCOUNTER — Encounter: Payer: Self-pay | Admitting: Family Medicine

## 2020-09-19 ENCOUNTER — Ambulatory Visit (INDEPENDENT_AMBULATORY_CARE_PROVIDER_SITE_OTHER): Payer: PPO | Admitting: Family Medicine

## 2020-09-19 VITALS — BP 126/72 | HR 76 | Temp 97.0°F | Ht 65.0 in | Wt 180.5 lb

## 2020-09-19 DIAGNOSIS — R7303 Prediabetes: Secondary | ICD-10-CM

## 2020-09-19 DIAGNOSIS — M810 Age-related osteoporosis without current pathological fracture: Secondary | ICD-10-CM | POA: Diagnosis not present

## 2020-09-19 DIAGNOSIS — Z0000 Encounter for general adult medical examination without abnormal findings: Secondary | ICD-10-CM

## 2020-09-19 DIAGNOSIS — E559 Vitamin D deficiency, unspecified: Secondary | ICD-10-CM | POA: Diagnosis not present

## 2020-09-19 DIAGNOSIS — E7800 Pure hypercholesterolemia, unspecified: Secondary | ICD-10-CM | POA: Diagnosis not present

## 2020-09-19 DIAGNOSIS — F419 Anxiety disorder, unspecified: Secondary | ICD-10-CM | POA: Diagnosis not present

## 2020-09-19 DIAGNOSIS — I10 Essential (primary) hypertension: Secondary | ICD-10-CM | POA: Diagnosis not present

## 2020-09-19 MED ORDER — EZETIMIBE 10 MG PO TABS: 10.0000 mg | ORAL_TABLET | Freq: Every day | ORAL | 3 refills | Status: DC

## 2020-09-19 MED ORDER — ESCITALOPRAM OXALATE 20 MG PO TABS: 20.0000 mg | ORAL_TABLET | Freq: Every day | ORAL | 3 refills | Status: DC

## 2020-09-19 MED ORDER — FLUTICASONE PROPIONATE 50 MCG/ACT NA SUSP: 2.0000 | Freq: Every day | NASAL | 3 refills | Status: DC | PRN

## 2020-09-19 MED ORDER — HYDROCHLOROTHIAZIDE 25 MG PO TABS: 25.0000 mg | ORAL_TABLET | Freq: Every day | ORAL | 3 refills | Status: DC

## 2020-09-19 NOTE — Assessment & Plan Note (Signed)
bp in fair control at this time  BP Readings from Last 1 Encounters:  09/19/20 126/72   No changes needed-continues hctz 25 mg daily Most recent labs reviewed  Disc lifstyle change with low sodium diet and exercise

## 2020-09-19 NOTE — Assessment & Plan Note (Signed)
Level of 25  Disc imp for bone and overall health  inst to inc daily dose of D3 to 4000 iu

## 2020-09-19 NOTE — Assessment & Plan Note (Signed)
Reviewed health habits including diet and exercise and skin cancer prevention Reviewed appropriate screening tests for age  Also reviewed health mt list, fam hx and immunization status , as well as social and family history   See HPI amw reviewed  Labs rev utd mammogram and breast cancer care  Disc treatment options for OP / dexa was 7/20 Encouraged exercise  covid vaccinated-enc to consider booster  Had shingrix vaccines  Mood is stable with lexapro-inst to try dosing at bedtime to see if helpful for sleep

## 2020-09-19 NOTE — Assessment & Plan Note (Signed)
Lab Results  Component Value Date   HGBA1C 5.7 09/09/2020   Stable disc imp of low glycemic diet and wt loss to prevent DM2

## 2020-09-19 NOTE — Progress Notes (Signed)
Subjective:    Patient ID: Erin Hinton, female    DOB: 05-07-1946, 74 y.o.   MRN: [id number]  This visit occurred during the SARS-CoV-2 public health emergency.  Safety protocols were in place, including screening questions prior to the visit, additional usage of staff PPE, and extensive cleaning of exam room while observing appropriate contact time as indicated for disinfecting solutions.    HPI Here for health maintenance exam and to review chronic medical problems    Wt Readings from Last 3 Encounters:  09/19/20 180 lb 8 oz (81.9 kg)  11/18/19 186 lb 6.4 oz (84.6 kg)  09/02/19 183 lb 2 oz (83.1 kg)  wt is down 6 lb  Walking for exercise  Eating healthy  30.04 kg/m  Had amw on 9/22 No gaps noted  Mammogram 1/21 Self breast exam -nothing new (still has sore places from surgery) Personal history of breast cancer  Sister had breast cancer in her 57s, daughter had it in her 23s    colonoscopy 12/16  dexa 7/20 - OP Intolerant of alendronate in the past  Tried aromatase inhibitors  Falls- none fx -none Supplements  Low D today at 25.0  Exercise - a bit limited due to knee pain, is still walking however   May be interested in prolia    covid immunized - interested in booster  Had flu shot last month Had shingrix vaccines  HTN bp is stable today  No cp or palpitations or headaches or edema  No side effects to medicines  BP Readings from Last 3 Encounters:  09/19/20 126/72  11/18/19 (!) 151/93  09/02/19 136/78    Takes hctz 25 mg Pulse Readings from Last 3 Encounters:  09/19/20 76  11/18/19 96  09/02/19 83    Prediabetes Lab Results  Component Value Date   HGBA1C 5.7 09/09/2020  stable   Hyperlipidemia Lab Results  Component Value Date   CHOL 212 (H) 09/09/2020   CHOL 195 08/27/2019   CHOL 230 (H) 08/21/2018   Lab Results  Component Value Date   HDL 56.90 09/09/2020   HDL 49.80 08/27/2019   HDL 62.10 08/21/2018   Lab Results   Component Value Date   LDLCALC 140 (H) 09/09/2020   LDLCALC 130 (H) 08/27/2019   LDLCALC 148 (H) 08/21/2018   Lab Results  Component Value Date   TRIG 79.0 09/09/2020   TRIG 73.0 08/27/2019   TRIG 98.0 08/21/2018   Lab Results  Component Value Date   CHOLHDL 4 09/09/2020   CHOLHDL 4 08/27/2019   CHOLHDL 4 08/21/2018   Lab Results  Component Value Date   LDLDIRECT 128.6 11/28/2010   LDLDIRECT 152.1 11/24/2009   LDLDIRECT 138.9 11/23/2008   Intolerant to all statins (even intermittent low dose crestor) Has CAD in family  Former smoker   Open to zetia   Takes lexapro for anxiety disorder  It helps but she still struggles  Has trouble falling asleep  Has tried melatonin   Lab Results  Component Value Date   CREATININE 0.80 09/09/2020   BUN 21 09/09/2020   NA 142 09/09/2020   K 4.1 09/09/2020   CL 104 09/09/2020   CO2 33 (H) 09/09/2020   Lab Results  Component Value Date   WBC 3.7 (L) 09/09/2020   HGB 13.0 09/09/2020   HCT 39.2 09/09/2020   MCV 99.4 09/09/2020   PLT 203.0 09/09/2020   Lab Results  Component Value Date   ALT 9 09/09/2020   AST 14  09/09/2020   ALKPHOS 54 09/09/2020   BILITOT 0.6 09/09/2020    Lab Results  Component Value Date   TSH 1.85 09/09/2020    Patient Active Problem List   Diagnosis Date Noted   Vitamin D deficiency 09/19/2020   Medicare annual wellness visit, subsequent 09/02/2019   Mitral prolapse 02/11/2019   Abnormal EKG 02/10/2019   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   Family history of heart disease 07/15/2017   Prediabetes 07/15/2017   Anxiety 07/15/2017   Preoperative cardiovascular examination 04/07/2014   Colon cancer screening 03/09/2013   Routine general medical examination at a health care facility 01/30/2012   HYPERTENSION, BENIGN ESSENTIAL 02/01/2010   Osteoporosis 11/23/2008   Former smoker 10/21/2007   Hyperlipidemia 09/29/2007   MITRAL VALVE PROLAPSE 09/29/2007    FIBROCYSTIC BREAST DISEASE 09/29/2007   ALLERGY 09/29/2007   Past Medical History:  Diagnosis Date   Anxiety    Breast cancer ([place]) 1996   Right breast, Dr Faylene Million, DCIS, lumpectomy only   Cancer ([place])    skin   DCIS (ductal carcinoma in situ) 03/02/2019   Left, 11 mm high-grade DCIS, 10 mm margins. ER positive: 51-90%   Depression    Heart murmur    in her 30's and it is not a problem now   Hyperlipidemia    Hyperlipidemia    Hypertension    Osteopenia    Personal history of radiation therapy    Shortness of breath dyspnea    due to excercise only   Past Surgical History:  Procedure Laterality Date   BREAST BIOPSY Left 01/27/2019   Affirm Bx- Coil Clip- HIGH-GRADE DUCTAL CARCINOMA IN SITU, WITH COMEDONECROSIS.   BREAST EXCISIONAL BIOPSY Right 01/2002   neg. no lymph node involvement   BREAST EXCISIONAL BIOPSY Left 03/02/2019   lumpectomy DCIS   BREAST LUMPECTOMY Left 03/02/2019   left lumpectomy dcis with NL   BREAST LUMPECTOMY Left 03/02/2019   Procedure: BREAST WIDE EXCISION LEFT, WITH WIRE LOCATION;  Surgeon: Robert Bellow, MD;  Location: [facility] ORS;  Service: General;  Laterality: Left;   BREAST SURGERY  1998   breast biopsy DCIS   CATARACT EXTRACTION W/PHACO Right 05/01/2016   Procedure: CATARACT EXTRACTION PHACO AND INTRAOCULAR LENS PLACEMENT ([place]);  Surgeon: Birder Robson, MD;  Location: [facility] ORS;  Service: Ophthalmology;  Laterality: Right;  [place] 00:53 AP% 21.7 CDE 11.50 fluid pack lot # [id number]   CATARACT EXTRACTION W/PHACO Left 06/05/2016   Procedure: CATARACT EXTRACTION PHACO AND INTRAOCULAR LENS PLACEMENT ([facility]);  Surgeon: Birder Robson, MD;  Location: [facility] ORS;  Service: Ophthalmology;  Laterality: Left;  [place] 01:04 AP% 22.2 CDE 14.34 fluid pack lot # [id number]   COLONOSCOPY WITH PROPOFOL N/A 11/21/2015   Procedure: COLONOSCOPY WITH PROPOFOL;  Surgeon: Hulen Luster, MD;  Location: [facility] END[facility]COPY;  Service: Gastroenterology;   Laterality: N/A;   Social History   Tobacco Use   Smoking status: Former Smoker    Packs/day: 0.50    Years: 45.00    Pack years: 22.50    Types: Cigarettes, E-cigarettes    Quit date: 01/22/2016    Years since quitting: 4.6   Smokeless tobacco: Never Used   Tobacco comment: also uses e-cigaretts on occasion  Vaping Use   Vaping Use: Some days   Substances: Nicotine, Flavoring  Substance Use Topics   Alcohol use: No    Alcohol/week: 0.0 standard drinks   Drug use: No   Family History  Problem Relation Age of Onset  Ovarian cancer Mother    Heart disease Sister    Lung cancer Sister    Lymphoma Father    Heart disease Father        MI   Hypertension Father    Alcohol abuse Brother    Lung cancer Brother    Breast cancer Sister 40   Heart disease Sister    Heart attack Sister    Breast cancer Daughter 60       Theophilus Kinds, LCIS   Breast cancer Paternal Aunt 75   Colon cancer Neg Hx    Allergies  Allergen Reactions   Alendronate Sodium     GI pain   Atorvastatin     Muscle pain   Crestor [Rosuvastatin Calcium]     Muscle pain   Evista [Raloxifene] Other (See Comments)    Breast tenderness   Sertraline Hcl     Did not help, made anxiety worse   Pseudoephedrine Palpitations   Current Outpatient Medications on File Prior to Visit  Medication Sig Dispense Refill   acetaminophen (TYLENOL) 500 MG tablet Take 500 mg by mouth every 6 (six) hours as needed (for pain.).     Ascorbic Acid (VITAMIN C WITH ROSE HIPS) 1000 MG tablet Take 1,000 mg by mouth daily.     Calcium Carb-Cholecalciferol (CALCIUM 600 + D PO) Take 1 capsule by mouth daily.     Calcium-Magnesium-Zinc (CAL-MAG-ZINC PO) Take 1 tablet by mouth daily.     Cholecalciferol (VITAMIN D) 50 MCG (2000 UT) tablet Take 2,000 Units by mouth daily.     Coconut Oil 1000 MG CAPS Take 1,000 mg by mouth daily.     Omega-3 Fatty Acids (FISH OIL) 1000 MG CAPS Take 1,000 mg by mouth  daily.     No current facility-administered medications on file prior to visit.    Review of Systems  Constitutional: Negative for activity change, appetite change, fatigue, fever and unexpected weight change.  HENT: Negative for congestion, ear pain, rhinorrhea, sinus pressure and sore throat.   Eyes: Negative for pain, redness and visual disturbance.  Respiratory: Negative for cough, shortness of breath and wheezing.   Cardiovascular: Negative for chest pain and palpitations.  Gastrointestinal: Negative for abdominal pain, blood in stool, constipation and diarrhea.  Endocrine: Negative for polydipsia and polyuria.  Genitourinary: Negative for dysuria, frequency and urgency.  Musculoskeletal: Positive for arthralgias. Negative for back pain and myalgias.  Skin: Negative for pallor and rash.  Allergic/Immunologic: Negative for environmental allergies.  Neurological: Negative for dizziness, syncope and headaches.  Hematological: Negative for adenopathy. Does not bruise/bleed easily.  Psychiatric/Behavioral: Positive for sleep disturbance. Negative for decreased concentration and dysphoric mood. The patient is nervous/anxious.        Objective:   Physical Exam Constitutional:      General: She is not in acute distress.    Appearance: Normal appearance. She is well-developed. She is obese. She is not ill-appearing or diaphoretic.  HENT:     Head: Normocephalic and atraumatic.     Right Ear: Tympanic membrane, ear canal and external ear normal.     Left Ear: Tympanic membrane, ear canal and external ear normal.     Nose: Nose normal. No congestion.     Mouth/Throat:     Mouth: Mucous membranes are moist.     Pharynx: Oropharynx is clear. No posterior oropharyngeal erythema.  Eyes:     General: No scleral icterus.    Extraocular Movements: Extraocular movements intact.     Conjunctiva/sclera:  Conjunctivae normal.     Pupils: Pupils are equal, round, and reactive to light.  Neck:      Thyroid: No thyromegaly.     Vascular: No carotid bruit or JVD.  Cardiovascular:     Rate and Rhythm: Normal rate and regular rhythm.     Pulses: Normal pulses.     Heart sounds: Normal heart sounds. No gallop.   Pulmonary:     Effort: Pulmonary effort is normal. No respiratory distress.     Breath sounds: Normal breath sounds. No wheezing.     Comments: Good air exch Chest:     Chest wall: No tenderness.  Abdominal:     General: Bowel sounds are normal. There is no distension or abdominal bruit.     Palpations: Abdomen is soft. There is no mass.     Tenderness: There is no abdominal tenderness.     Hernia: No hernia is present.  Genitourinary:    Comments: Breast exam: No mass, nodules, thickening, tenderness, bulging, retraction, inflamation, nipple discharge or skin changes noted.  No axillary or clavicular LA.     Musculoskeletal:        General: No tenderness. Normal range of motion.     Cervical back: Normal range of motion and neck supple. No rigidity. No muscular tenderness.     Right lower leg: No edema.     Left lower leg: No edema.  Lymphadenopathy:     Cervical: No cervical adenopathy.  Skin:    General: Skin is warm and dry.     Coloration: Skin is not pale.     Findings: No erythema or rash.     Comments: Solar lentigines diffusely Some sks and solar aging  Neurological:     Mental Status: She is alert. Mental status is at baseline.     Cranial Nerves: No cranial nerve deficit.     Motor: No abnormal muscle tone.     Coordination: Coordination normal.     Gait: Gait normal.     Deep Tendon Reflexes: Reflexes are normal and symmetric. Reflexes normal.  Psychiatric:        Mood and Affect: Mood normal.        Cognition and Memory: Cognition and memory normal.           Assessment & Plan:   Problem List Items Addressed This Visit      Cardiovascular and Mediastinum   HYPERTENSION, BENIGN ESSENTIAL    bp in fair control at this time  BP Readings  from Last 1 Encounters:  09/19/20 126/72   No changes needed-continues hctz 25 mg daily Most recent labs reviewed  Disc lifstyle change with low sodium diet and exercise        Relevant Medications   ezetimibe (ZETIA) 10 MG tablet   hydrochlorothiazide (HYDRODIURIL) 25 MG tablet     Musculoskeletal and Integument   Osteoporosis    Given info on prolia (intol of oral bisphosphenate and has been on aromatase inhibitor in the past)  dexa 7/20  No falls or fx Low D- will inc dose to 4000 iu daily  Enc exercise          Other   Hyperlipidemia    Disc goals for lipids and reasons to control them Rev last labs with pt Rev low sat fat diet in detail  Intol to all statins Will try zetia 10 mg daily  Re check lipid in 2-3 mo       Relevant Medications  ezetimibe (ZETIA) 10 MG tablet   hydrochlorothiazide (HYDRODIURIL) 25 MG tablet   Routine general medical examination at a health care facility - Primary    Reviewed health habits including diet and exercise and skin cancer prevention Reviewed appropriate screening tests for age  Also reviewed health mt list, fam hx and immunization status , as well as social and family history   See HPI amw reviewed  Labs rev utd mammogram and breast cancer care  Disc treatment options for OP / dexa was 7/20 Encouraged exercise  covid vaccinated-enc to consider booster  Had shingrix vaccines  Mood is stable with lexapro-inst to try dosing at bedtime to see if helpful for sleep      Prediabetes    Lab Results  Component Value Date   HGBA1C 5.7 09/09/2020   Stable disc imp of low glycemic diet and wt loss to prevent DM2       Anxiety    More sleep problems Reviewed stressors/ coping techniques/symptoms/ support sources/ tx options and side effects in detail today Will try dosing lexapro to bedtime and see if it helps Will update       Relevant Medications   escitalopram (LEXAPRO) 20 MG tablet   Vitamin D deficiency    Level  of 25  Disc imp for bone and overall health  inst to inc daily dose of D3 to 4000 iu

## 2020-09-19 NOTE — Assessment & Plan Note (Signed)
More sleep problems Reviewed stressors/ coping techniques/symptoms/ support sources/ tx options and side effects in detail today Will try dosing lexapro to bedtime and see if it helps Will update

## 2020-09-19 NOTE — Patient Instructions (Addendum)
I would like to see if prolia is covered for osteoporosis  If you get a chance -check with your insurance   Think about getting a covid booster COVID-19 Vaccine Information can be found at: ShippingScam.co.uk For questions related to vaccine distribution or appointments, please email vaccine@[email or web address] .[email or web address] or call (218)[phone].    Let's try zetia 10 mg once daily  If side effects stop it  Avoid red meat/ fried foods/ egg yolks/ fatty breakfast meats/ butter, cheese and high fat dairy/ and shellfish    Change your lexapro dose to bedtime  See if this helps with sleep   Your vitamin D level is low  Please increase daily dose of vit D3 to 4000 iu daily

## 2020-09-19 NOTE — Assessment & Plan Note (Signed)
Given info on prolia (intol of oral bisphosphenate and has been on aromatase inhibitor in the past)  dexa 7/20  No falls or fx Low D- will inc dose to 4000 iu daily  Enc exercise

## 2020-09-19 NOTE — Assessment & Plan Note (Signed)
Disc goals for lipids and reasons to control them Rev last labs with pt Rev low sat fat diet in detail  Intol to all statins Will try zetia 10 mg daily  Re check lipid in 2-3 mo

## 2020-11-03 DIAGNOSIS — D2262 Melanocytic nevi of left upper limb, including shoulder: Secondary | ICD-10-CM | POA: Diagnosis not present

## 2020-11-03 DIAGNOSIS — D2271 Melanocytic nevi of right lower limb, including hip: Secondary | ICD-10-CM | POA: Diagnosis not present

## 2020-11-03 DIAGNOSIS — X32XXXA Exposure to sunlight, initial encounter: Secondary | ICD-10-CM | POA: Diagnosis not present

## 2020-11-03 DIAGNOSIS — Z85828 Personal history of other malignant neoplasm of skin: Secondary | ICD-10-CM | POA: Diagnosis not present

## 2020-11-03 DIAGNOSIS — D0462 Carcinoma in situ of skin of left upper limb, including shoulder: Secondary | ICD-10-CM | POA: Diagnosis not present

## 2020-11-03 DIAGNOSIS — D485 Neoplasm of uncertain behavior of skin: Secondary | ICD-10-CM | POA: Diagnosis not present

## 2020-11-03 DIAGNOSIS — L570 Actinic keratosis: Secondary | ICD-10-CM | POA: Diagnosis not present

## 2020-11-03 DIAGNOSIS — D2261 Melanocytic nevi of right upper limb, including shoulder: Secondary | ICD-10-CM | POA: Diagnosis not present

## 2020-11-03 DIAGNOSIS — D225 Melanocytic nevi of trunk: Secondary | ICD-10-CM | POA: Diagnosis not present

## 2020-11-08 ENCOUNTER — Other Ambulatory Visit (INDEPENDENT_AMBULATORY_CARE_PROVIDER_SITE_OTHER): Payer: PPO

## 2020-11-08 ENCOUNTER — Other Ambulatory Visit: Payer: Self-pay

## 2020-11-08 ENCOUNTER — Telehealth: Payer: Self-pay | Admitting: *Deleted

## 2020-11-08 DIAGNOSIS — R350 Frequency of micturition: Secondary | ICD-10-CM

## 2020-11-08 DIAGNOSIS — R3915 Urgency of urination: Secondary | ICD-10-CM

## 2020-11-08 DIAGNOSIS — E7800 Pure hypercholesterolemia, unspecified: Secondary | ICD-10-CM

## 2020-11-08 LAB — POC URINALSYSI DIPSTICK (AUTOMATED)
Bilirubin, UA: NEGATIVE
Blood, UA: NEGATIVE
Glucose, UA: NEGATIVE
Ketones, UA: NEGATIVE
Leukocytes, UA: NEGATIVE
Nitrite, UA: NEGATIVE
Protein, UA: POSITIVE — AB
Spec Grav, UA: 1.01 (ref 1.010–1.025)
Urobilinogen, UA: 0.2 E.U./dL
pH, UA: 8.5 — AB (ref 5.0–8.0)

## 2020-11-08 LAB — LIPID PANEL
Cholesterol: 200 mg/dL (ref 0–200)
HDL: 60.1 mg/dL (ref >39.00)
LDL Cholesterol: 119 mg/dL — ABNORMAL HIGH (ref 0–99)
NonHDL: 140.35
Total CHOL/HDL Ratio: 3
Triglycerides: 109 mg/dL (ref 0.0–149.0)
VLDL: 21.8 mg/dL (ref 0.0–40.0)

## 2020-11-08 NOTE — Telephone Encounter (Signed)
Pt was here for labs to recheck her cholesterol (per last OV note), she was under the impression that this was a UA drop off to check for UTI. She said she talked to someone and told them that she was having left flank pain, urinary urgency, frequency and they told her she could leave a urine sample here. There is no notes, phone note or any mention of UTI sxs or an approval for UA drop off. Pt has left after giving urine sample and getting labs done.  Dr. Glori Bickers is it okay to check her urine since she is having left flank pain, urinary urgency and frequency, please advise

## 2020-11-08 NOTE — Addendum Note (Signed)
Addended by: Tammi Sou on: 11/08/2020 11:29 AM   Modules accepted: Orders

## 2020-11-08 NOTE — Telephone Encounter (Signed)
Please go ahead with ua and culture  Thanks

## 2020-11-09 LAB — URINE CULTURE
MICRO NUMBER:: 11238938
SPECIMEN QUALITY:: ADEQUATE

## 2020-11-15 ENCOUNTER — Encounter: Payer: Self-pay | Admitting: Radiation Oncology

## 2020-11-16 ENCOUNTER — Encounter: Payer: Self-pay | Admitting: Radiation Oncology

## 2020-11-16 ENCOUNTER — Other Ambulatory Visit: Payer: Self-pay

## 2020-11-16 ENCOUNTER — Ambulatory Visit
Admission: RE | Admit: 2020-11-16 | Discharge: 2020-11-16 | Disposition: A | Discharge status: Home / Self Care | Payer: PPO | Source: Ambulatory Visit | Attending: Radiation Oncology | Admitting: Radiation Oncology

## 2020-11-16 VITALS — BP 169/93 | HR 86 | Temp 98.4°F | Wt 177.6 lb

## 2020-11-16 DIAGNOSIS — D0512 Intraductal carcinoma in situ of left breast: Secondary | ICD-10-CM

## 2020-11-16 DIAGNOSIS — Z923 Personal history of irradiation: Secondary | ICD-10-CM | POA: Insufficient documentation

## 2020-11-16 DIAGNOSIS — Z86000 Personal history of in-situ neoplasm of breast: Secondary | ICD-10-CM | POA: Diagnosis not present

## 2020-11-16 NOTE — Progress Notes (Signed)
Radiation Oncology Follow up Note  Name: Erin Hinton   Date:   11/16/2020 MRN:  [id number] DOB: 11-Mar-1946    This 74 y.o. 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  REFERRING PROVIDER: Tower, Wynelle Fanny, MD  HPI: Patient is a 74 year old female now at 18 months having completed whole breast radiation to her left breast for ER/PR positive ductal carcinoma in situ.  She is seen today in routine follow-up doing well still has some slight tenderness in the breast and axilla although it is improving.  She had mammograms back in.  January which I have reviewed were BI-RADS 2 benign she will have follow-up mammograms this January which to forward to me.  She was on 2 antiestrogen therapies although cannot tolerate either 1 and is not on treatment now.  She specifically denies significant breast tenderness cough or bone pain.  COMPLICATIONS OF TREATMENT: none  FOLLOW UP COMPLIANCE: keeps appointments   PHYSICAL EXAM:  BP (!) 169/93   Pulse 86   Temp 98.4 F (36.9 C) (Tympanic)   Wt 177 lb 9.6 oz (80.6 kg)   BMI 29.55 kg/m  Lungs are clear to A&P cardiac examination essentially unremarkable with regular rate and rhythm. No dominant mass or nodularity is noted in either breast in 2 positions examined. Incision is well-healed. No axillary or supraclavicular adenopathy is appreciated. Cosmetic result is excellent.  Well-developed well-nourished patient in NAD. HEENT reveals PERLA, EOMI, discs not visualized.  Oral cavity is clear. No oral mucosal lesions are identified. Neck is clear without evidence of cervical or supraclavicular adenopathy. Lungs are clear to A&P. Cardiac examination is essentially unremarkable with regular rate and rhythm without murmur rub or thrill. Abdomen is benign with no organomegaly or masses noted. Motor sensory and DTR levels are equal and symmetric  in the upper and lower extremities. Cranial nerves II through XII are grossly intact. Proprioception is intact. No peripheral adenopathy or edema is identified. No motor or sensory levels are noted. Crude visual fields are within normal range.  RADIOLOGY RESULTS: Mammograms reviewed compatible with above-stated findings  PLAN: Present time patient is doing well with no evidence of disease 18 months out.  She has discontinued her antiestrogen therapy based on side effect profile.  She has mammogram scheduled for next month.  I have asked to see her back in 1 year for follow-up.  Patient knows to call with any concerns.  I would like to take this opportunity to thank you for allowing me to participate in the care of your patient.Noreene Filbert, MD

## 2020-11-18 ENCOUNTER — Other Ambulatory Visit: Payer: PPO

## 2020-12-07 DIAGNOSIS — D2362 Other benign neoplasm of skin of left upper limb, including shoulder: Secondary | ICD-10-CM | POA: Diagnosis not present

## 2020-12-07 DIAGNOSIS — D0462 Carcinoma in situ of skin of left upper limb, including shoulder: Secondary | ICD-10-CM | POA: Diagnosis not present

## 2020-12-07 DIAGNOSIS — L570 Actinic keratosis: Secondary | ICD-10-CM | POA: Diagnosis not present

## 2020-12-13 ENCOUNTER — Telehealth: Payer: Self-pay | Admitting: Family Medicine

## 2020-12-13 DIAGNOSIS — Z853 Personal history of malignant neoplasm of breast: Secondary | ICD-10-CM | POA: Insufficient documentation

## 2020-12-13 NOTE — Telephone Encounter (Signed)
Pt called in she received a letter from [facility] and she  wanted to know about getting a order for her mammogram , she tried to schedule but they told her that she needed a order from the doctor.

## 2020-12-13 NOTE — Telephone Encounter (Signed)
I put the order in so she should be able to schedule

## 2020-12-13 NOTE — Telephone Encounter (Signed)
Pt notified order in and she can schedule mammo now.

## 2021-01-11 ENCOUNTER — Other Ambulatory Visit: Payer: Self-pay

## 2021-01-11 ENCOUNTER — Ambulatory Visit
Admission: RE | Admit: 2021-01-11 | Discharge: 2021-01-11 | Disposition: A | Discharge status: Home / Self Care | Payer: PPO | Source: Ambulatory Visit | Attending: Family Medicine | Admitting: Family Medicine

## 2021-01-11 DIAGNOSIS — R922 Inconclusive mammogram: Secondary | ICD-10-CM | POA: Diagnosis not present

## 2021-01-11 DIAGNOSIS — Z853 Personal history of malignant neoplasm of breast: Secondary | ICD-10-CM | POA: Diagnosis not present

## 2021-01-19 DIAGNOSIS — M8580 Other specified disorders of bone density and structure, unspecified site: Secondary | ICD-10-CM | POA: Diagnosis not present

## 2021-01-19 DIAGNOSIS — Z853 Personal history of malignant neoplasm of breast: Secondary | ICD-10-CM | POA: Diagnosis not present

## 2021-05-04 ENCOUNTER — Other Ambulatory Visit: Payer: Self-pay | Admitting: General Surgery

## 2021-05-04 DIAGNOSIS — Z79811 Long term (current) use of aromatase inhibitors: Secondary | ICD-10-CM

## 2021-05-08 DIAGNOSIS — D2272 Melanocytic nevi of left lower limb, including hip: Secondary | ICD-10-CM | POA: Diagnosis not present

## 2021-05-08 DIAGNOSIS — D485 Neoplasm of uncertain behavior of skin: Secondary | ICD-10-CM | POA: Diagnosis not present

## 2021-05-08 DIAGNOSIS — D2262 Melanocytic nevi of left upper limb, including shoulder: Secondary | ICD-10-CM | POA: Diagnosis not present

## 2021-05-08 DIAGNOSIS — L570 Actinic keratosis: Secondary | ICD-10-CM | POA: Diagnosis not present

## 2021-05-08 DIAGNOSIS — D0462 Carcinoma in situ of skin of left upper limb, including shoulder: Secondary | ICD-10-CM | POA: Diagnosis not present

## 2021-05-08 DIAGNOSIS — Z85828 Personal history of other malignant neoplasm of skin: Secondary | ICD-10-CM | POA: Diagnosis not present

## 2021-05-08 DIAGNOSIS — X32XXXA Exposure to sunlight, initial encounter: Secondary | ICD-10-CM | POA: Diagnosis not present

## 2021-05-08 DIAGNOSIS — D2261 Melanocytic nevi of right upper limb, including shoulder: Secondary | ICD-10-CM | POA: Diagnosis not present

## 2021-06-06 DIAGNOSIS — D0462 Carcinoma in situ of skin of left upper limb, including shoulder: Secondary | ICD-10-CM | POA: Diagnosis not present

## 2021-06-14 ENCOUNTER — Other Ambulatory Visit: Payer: Self-pay

## 2021-06-14 ENCOUNTER — Ambulatory Visit: Payer: PPO | Admitting: Cardiovascular Disease

## 2021-06-14 ENCOUNTER — Encounter: Payer: Self-pay | Admitting: Cardiovascular Disease

## 2021-06-14 VITALS — BP 144/80 | HR 66 | Ht 66.0 in | Wt 180.2 lb

## 2021-06-14 DIAGNOSIS — Z87891 Personal history of nicotine dependence: Secondary | ICD-10-CM

## 2021-06-14 DIAGNOSIS — I10 Essential (primary) hypertension: Secondary | ICD-10-CM | POA: Diagnosis not present

## 2021-06-14 DIAGNOSIS — R0602 Shortness of breath: Secondary | ICD-10-CM

## 2021-06-14 DIAGNOSIS — R7303 Prediabetes: Secondary | ICD-10-CM

## 2021-06-14 DIAGNOSIS — E7800 Pure hypercholesterolemia, unspecified: Secondary | ICD-10-CM | POA: Diagnosis not present

## 2021-06-14 NOTE — Progress Notes (Signed)
Cardiology Office Note  Date:  06/14/2021   ID:  Erin Hinton, Erin Hinton 02-Jun-1946, MRN [id number]  PCP:  Abner Greenspan, MD   Chief Complaint  Patient presents with   Shortness of Breath    Patient c/o shortness of breath, pain in the back of arms and no energy. Medications reviewed by the patient verbally.    HPI:  Ms. Julyana Woolverton is a 75 year old woman with past medical history of Smoking history Essential hypertension Breast cancer Previously followed in the [place] office, last seen in February 2020 Evaluated at that time for abnormal EKG preop for breast surgery Who presents for shortness of breath  Prior studies reviewed Echocardiogram 2020  normal ejection fraction No significant valvular heart disease  Difficult year, breast cancer receiving radiation on the left, no chemotherapy Sedentary through COVID, weight up 20 pounds Used to walk on a regular basis, has not been doing so recently  History of hyperlipidemia On zetia Not on statin  Denies any chest pain on exertion No leg swelling, no PND orthopnea  Occasionally uses vapor cigarette  EKG personally reviewed by myself on todays visit Normal sinus rhythm rate 66 bpm nonspecific ST abnormality 2 3 aVF, no change from prior EKG  PMH:   has a past medical history of Anxiety, Breast cancer ([place]) (1996), Cancer ([place]), 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  PSH:    Past Surgical History:  Procedure Laterality Date   BREAST BIOPSY Left 01/27/2019   Affirm Bx- Coil Clip- HIGH-GRADE DUCTAL CARCINOMA IN SITU, WITH COMEDONECROSIS.   BREAST EXCISIONAL BIOPSY Right 01/2002   neg. no lymph node involvement   BREAST EXCISIONAL BIOPSY Left 03/02/2019   lumpectomy DCIS   BREAST LUMPECTOMY Left 03/02/2019   left lumpectomy dcis with NL   BREAST  LUMPECTOMY Left 03/02/2019   Procedure: BREAST WIDE EXCISION LEFT, WITH WIRE LOCATION;  Surgeon: Robert Bellow, MD;  Location: [facility] ORS;  Service: General;  Laterality: Left;   BREAST SURGERY  1998   breast biopsy DCIS   CATARACT EXTRACTION W/PHACO Right 05/01/2016   Procedure: CATARACT EXTRACTION PHACO AND INTRAOCULAR LENS PLACEMENT ([place]);  Surgeon: Birder Robson, MD;  Location: [facility] ORS;  Service: Ophthalmology;  Laterality: Right;  [place] 00:53 AP% 21.7 CDE 11.50 fluid pack lot # [id number]   CATARACT EXTRACTION W/PHACO Left 06/05/2016   Procedure: CATARACT EXTRACTION PHACO AND INTRAOCULAR LENS PLACEMENT ([facility]);  Surgeon: Birder Robson, MD;  Location: [facility] ORS;  Service: Ophthalmology;  Laterality: Left;  [place] 01:04 AP% 22.2 CDE 14.34 fluid pack lot # [id number]   COLONOSCOPY WITH PROPOFOL N/A 11/21/2015   Procedure: COLONOSCOPY WITH PROPOFOL;  Surgeon: Hulen Luster, MD;  Location: [facility] ENDOSCOPY;  Service: Gastroenterology;  Laterality: N/A;    Current Outpatient Medications  Medication Sig Dispense Refill   acetaminophen (TYLENOL) 500 MG tablet Take 500 mg by mouth every 6 (six) hours as needed (for pain.).     Ascorbic Acid (VITAMIN C WITH ROSE HIPS) 1000 MG tablet Take 1,000 mg by mouth daily.     Calcium Carb-Cholecalciferol (CALCIUM 600 + D PO) Take 1 capsule by mouth daily.     Calcium-Magnesium-Zinc (CAL-MAG-ZINC PO) Take 1 tablet by mouth daily.     Cholecalciferol (VITAMIN D) 50 MCG (2000 UT) tablet Take 2,000 Units by mouth daily.     escitalopram (LEXAPRO) 20 MG tablet Take 1 tablet (20 mg total) by mouth  daily. 90 tablet 3   ezetimibe (ZETIA) 10 MG tablet Take 1 tablet (10 mg total) by mouth daily. 90 tablet 3   fluticasone (FLONASE) 50 MCG/ACT nasal spray Place 2 sprays into both nostrils daily as needed for allergies. 48 g 3   hydrochlorothiazide (HYDRODIURIL) 25 MG tablet Take 1 tablet (25 mg total) by mouth daily. 90 tablet 3   Omega-3 Fatty Acids (FISH OIL) 1000 MG CAPS  Take 1,000 mg by mouth daily.     vitamin E 1000 UNIT capsule Take 1 capsule by mouth daily.     No current facility-administered medications for this visit.    Allergies:   Alendronate sodium, Atorvastatin, Crestor [rosuvastatin calcium], Evista [raloxifene], Rosuvastatin, Sertraline hcl, and Pseudoephedrine   Social History:  The patient  reports that she quit smoking about 5 years ago. Her smoking use included cigarettes and e-cigarettes. She has a 22.50 pack-year smoking history. She has never used smokeless tobacco. She reports that she does not drink alcohol and does not use drugs.   Family History:   family history includes Alcohol abuse in her brother; Breast cancer (age of onset: 65) in her daughter; Breast cancer (age of onset: 69) in her paternal aunt; Breast cancer (age of onset: 56) in her sister; Heart attack in her sister; Heart disease in her father, sister, and sister; Hypertension in her father; Lung cancer in her brother and sister; Lymphoma in her father; Ovarian cancer in her mother.    Review of Systems: Review of Systems  Constitutional: Negative.   HENT: Negative.    Respiratory:  Positive for shortness of breath.   Cardiovascular: Negative.   Gastrointestinal: Negative.   Musculoskeletal: Negative.   Neurological: Negative.   Psychiatric/Behavioral: Negative.    All other systems reviewed and are negative.   PHYSICAL EXAM: VS:  BP (!) 144/80 (BP Location: Right Arm, Patient Position: Sitting, Cuff Size: Normal)   Pulse 66   Ht 5\' 6"  (1.676 m)   Wt 180 lb 4 oz (81.8 kg)   SpO2 98%   BMI 29.09 kg/m  , BMI Body mass index is 29.09 kg/m. GEN: Well nourished, well developed, in no acute distress HEENT: normal Neck: no JVD, carotid bruits, or masses Cardiac: RRR; no murmurs, rubs, or gallops,no edema  Respiratory:  clear to auscultation bilaterally, normal work of breathing GI: soft, nontender, nondistended, + BS MS: no deformity or atrophy Skin: warm and  dry, no rash Neuro:  Strength and sensation are intact Psych: euthymic mood, full affect   Recent Labs: 09/09/2020: ALT 9; BUN 21; Creatinine, Ser 0.80; Hemoglobin 13.0; Platelets 203.0; Potassium 4.1; Sodium 142; TSH 1.85    Lipid Panel Lab Results  Component Value Date   CHOL 200 11/08/2020   HDL 60.10 11/08/2020   LDLCALC 119 (H) 11/08/2020   TRIG 109.0 11/08/2020      Wt Readings from Last 3 Encounters:  06/14/21 180 lb 4 oz (81.8 kg)  11/16/20 177 lb 9.6 oz (80.6 kg)  09/19/20 180 lb 8 oz (81.9 kg)     ASSESSMENT AND PLAN:  Problem List Items Addressed This Visit       Cardiology Problems   Hyperlipidemia   HYPERTENSION, BENIGN ESSENTIAL   Relevant Orders   EKG 12-Lead     Other   Prediabetes   Relevant Orders   EKG 12-Lead   Former smoker   Relevant Orders   EKG 12-Lead   Other Visit Diagnoses     SOB (shortness of breath)    -  Primary   Relevant Orders   EKG 12-Lead      Essential hypertension Blood pressure is well controlled on today's visit. No changes made to the medications.  Hyperlipidemia Currently on Zetia, taking every other day Prefers not to be on a statin Discussed risk stratification testing, CT coronary calcium scoring offered  Shortness of breath Suspect secondary to deconditioning, weight gain Recommend regular walking program, lifestyle modification, weight loss If symptoms persist or get worse additional cardiac testing could be performed Calcium scoring offered Repeat echocardiogram could be performed, stress testing could also be performed Risk factors for underlying coronary disease include prior smoking history, hyperlipidemia.  This was discussed in detail  Breast cancer Treatment earlier this year with radiation on the left, no chemotherapy Discussed potential complications from radiation including acceleration of cardiovascular disease.  Suggest screening study with calcium scoring if desired   Total encounter  time more than 35 minutes  Greater than 50% was spent in counseling and coordination of care with the patient    Signed, Esmond Plants, M.D., Ph.D. [place], [place]

## 2021-06-14 NOTE — Patient Instructions (Addendum)
Call if you would like a CT coronary calcium score $99  Medication Instructions:  No changes  If you need a refill on your cardiac medications before your next appointment, please call your pharmacy.    Lab work: No new labs needed  If you have labs (blood work) drawn today and your tests are completely normal, you will receive your results only by: [place] (if you have MyChart) OR A paper copy in the mail If you have any lab test that is abnormal or we need to change your treatment, we will call you to review the results.   Testing/Procedures: No new testing needed   Follow-Up: At [facility], you and your health needs are our priority.  As part of our continuing mission to provide you with exceptional heart care, we have created designated Provider Care Teams.  These Care Teams include your primary Cardiologist (physician) and Advanced Practice Providers (APPs -  Physician Assistants and Nurse Practitioners) who all work together to provide you with the care you need, when you need it.  You will need a follow up appointment as needed  Providers on your designated Care Team:   Murray Hodgkins, NP Christell Faith, PA-C Marrianne Mood, PA-C  Any Other Special Instructions Will Be Listed Below (If Applicable).  COVID-19 Vaccine Information can be found at: [email or web address] For questions related to vaccine distribution or appointments, please email [email or web address] or call [phone].

## 2021-07-03 ENCOUNTER — Other Ambulatory Visit: Payer: Self-pay

## 2021-07-03 ENCOUNTER — Ambulatory Visit
Admission: RE | Admit: 2021-07-03 | Discharge: 2021-07-03 | Disposition: A | Discharge status: Home / Self Care | Payer: PPO | Source: Ambulatory Visit | Attending: General Surgery | Admitting: General Surgery

## 2021-07-03 DIAGNOSIS — Z853 Personal history of malignant neoplasm of breast: Secondary | ICD-10-CM | POA: Insufficient documentation

## 2021-07-03 DIAGNOSIS — Z79811 Long term (current) use of aromatase inhibitors: Secondary | ICD-10-CM

## 2021-07-03 DIAGNOSIS — M810 Age-related osteoporosis without current pathological fracture: Secondary | ICD-10-CM | POA: Diagnosis not present

## 2021-07-03 DIAGNOSIS — Z923 Personal history of irradiation: Secondary | ICD-10-CM | POA: Insufficient documentation

## 2021-07-03 DIAGNOSIS — Z13820 Encounter for screening for osteoporosis: Secondary | ICD-10-CM | POA: Insufficient documentation

## 2021-07-03 DIAGNOSIS — Z780 Asymptomatic menopausal state: Secondary | ICD-10-CM | POA: Insufficient documentation

## 2021-07-03 DIAGNOSIS — E559 Vitamin D deficiency, unspecified: Secondary | ICD-10-CM | POA: Diagnosis not present

## 2021-09-01 ENCOUNTER — Other Ambulatory Visit: Payer: Self-pay | Admitting: Family Medicine

## 2021-09-05 ENCOUNTER — Other Ambulatory Visit: Payer: PPO

## 2021-09-12 ENCOUNTER — Other Ambulatory Visit: Payer: PPO

## 2021-09-12 ENCOUNTER — Ambulatory Visit: Payer: PPO

## 2021-09-20 ENCOUNTER — Encounter: Payer: PPO | Admitting: Family Medicine

## 2021-11-07 DIAGNOSIS — Z85828 Personal history of other malignant neoplasm of skin: Secondary | ICD-10-CM | POA: Diagnosis not present

## 2021-11-07 DIAGNOSIS — D2271 Melanocytic nevi of right lower limb, including hip: Secondary | ICD-10-CM | POA: Diagnosis not present

## 2021-11-07 DIAGNOSIS — D485 Neoplasm of uncertain behavior of skin: Secondary | ICD-10-CM | POA: Diagnosis not present

## 2021-11-07 DIAGNOSIS — L570 Actinic keratosis: Secondary | ICD-10-CM | POA: Diagnosis not present

## 2021-11-07 DIAGNOSIS — D225 Melanocytic nevi of trunk: Secondary | ICD-10-CM | POA: Diagnosis not present

## 2021-11-07 DIAGNOSIS — D2262 Melanocytic nevi of left upper limb, including shoulder: Secondary | ICD-10-CM | POA: Diagnosis not present

## 2021-11-07 DIAGNOSIS — D0462 Carcinoma in situ of skin of left upper limb, including shoulder: Secondary | ICD-10-CM | POA: Diagnosis not present

## 2021-11-07 DIAGNOSIS — D2261 Melanocytic nevi of right upper limb, including shoulder: Secondary | ICD-10-CM | POA: Diagnosis not present

## 2021-11-07 DIAGNOSIS — X32XXXA Exposure to sunlight, initial encounter: Secondary | ICD-10-CM | POA: Diagnosis not present

## 2021-11-21 DIAGNOSIS — D0462 Carcinoma in situ of skin of left upper limb, including shoulder: Secondary | ICD-10-CM | POA: Diagnosis not present

## 2021-11-22 ENCOUNTER — Ambulatory Visit
Admission: RE | Admit: 2021-11-22 | Discharge: 2021-11-22 | Disposition: A | Discharge status: Home / Self Care | Payer: PPO | Source: Ambulatory Visit | Attending: Radiation Oncology | Admitting: Radiation Oncology

## 2021-11-22 ENCOUNTER — Other Ambulatory Visit: Payer: Self-pay

## 2021-11-22 ENCOUNTER — Encounter: Payer: Self-pay | Admitting: Radiation Oncology

## 2021-11-22 VITALS — BP 164/85 | HR 89 | Temp 96.8°F | Wt 180.4 lb

## 2021-11-22 DIAGNOSIS — D0512 Intraductal carcinoma in situ of left breast: Secondary | ICD-10-CM

## 2021-11-28 ENCOUNTER — Other Ambulatory Visit: Payer: Self-pay | Admitting: Family Medicine

## 2021-11-29 DIAGNOSIS — M3501 Sicca syndrome with keratoconjunctivitis: Secondary | ICD-10-CM | POA: Diagnosis not present

## 2021-12-01 ENCOUNTER — Other Ambulatory Visit: Payer: Self-pay | Admitting: Family Medicine

## 2021-12-01 DIAGNOSIS — Z1231 Encounter for screening mammogram for malignant neoplasm of breast: Secondary | ICD-10-CM

## 2021-12-05 ENCOUNTER — Other Ambulatory Visit: Payer: PPO

## 2021-12-12 ENCOUNTER — Encounter: Payer: PPO | Admitting: Family Medicine

## 2022-01-30 ENCOUNTER — Encounter: Payer: Self-pay | Admitting: Family Medicine

## 2022-01-30 ENCOUNTER — Other Ambulatory Visit: Payer: Self-pay

## 2022-01-30 ENCOUNTER — Ambulatory Visit (INDEPENDENT_AMBULATORY_CARE_PROVIDER_SITE_OTHER): Payer: PPO | Admitting: Family Medicine

## 2022-01-30 VITALS — BP 124/76 | HR 86 | Temp 98.5°F | Ht 64.41 in | Wt 177.6 lb

## 2022-01-30 DIAGNOSIS — R7303 Prediabetes: Secondary | ICD-10-CM | POA: Diagnosis not present

## 2022-01-30 DIAGNOSIS — E559 Vitamin D deficiency, unspecified: Secondary | ICD-10-CM

## 2022-01-30 DIAGNOSIS — E7800 Pure hypercholesterolemia, unspecified: Secondary | ICD-10-CM

## 2022-01-30 DIAGNOSIS — I10 Essential (primary) hypertension: Secondary | ICD-10-CM

## 2022-01-30 DIAGNOSIS — Z87891 Personal history of nicotine dependence: Secondary | ICD-10-CM

## 2022-01-30 DIAGNOSIS — Z0000 Encounter for general adult medical examination without abnormal findings: Secondary | ICD-10-CM | POA: Diagnosis not present

## 2022-01-30 DIAGNOSIS — M810 Age-related osteoporosis without current pathological fracture: Secondary | ICD-10-CM | POA: Diagnosis not present

## 2022-01-30 LAB — CBC WITH DIFFERENTIAL/PLATELET
Basophils Absolute: 0 10*3/uL (ref 0.0–0.1)
Basophils Relative: 0.5 % (ref 0.0–3.0)
Eosinophils Absolute: 0.1 10*3/uL (ref 0.0–0.7)
Eosinophils Relative: 2.1 % (ref 0.0–5.0)
HCT: 40 % (ref 36.0–46.0)
Hemoglobin: 13.3 g/dL (ref 12.0–15.0)
Lymphocytes Relative: 14 % (ref 12.0–46.0)
Lymphs Abs: 0.7 10*3/uL (ref 0.7–4.0)
MCHC: 33.3 g/dL (ref 30.0–36.0)
MCV: 98.5 fl (ref 78.0–100.0)
Monocytes Absolute: 0.5 10*3/uL (ref 0.1–1.0)
Monocytes Relative: 9.3 % (ref 3.0–12.0)
Neutro Abs: 3.8 10*3/uL (ref 1.4–7.7)
Neutrophils Relative %: 74.1 % (ref 43.0–77.0)
Platelets: 223 10*3/uL (ref 150.0–400.0)
RBC: 4.06 Mil/uL (ref 3.87–5.11)
RDW: 12.9 % (ref 11.5–15.5)
WBC: 5.1 10*3/uL (ref 4.0–10.5)

## 2022-01-30 LAB — TSH: TSH: 1.6 u[IU]/mL (ref 0.35–5.50)

## 2022-01-30 LAB — HEMOGLOBIN A1C: Hgb A1c MFr Bld: 5.5 % (ref 4.6–6.5)

## 2022-01-30 LAB — VITAMIN D 25 HYDROXY (VIT D DEFICIENCY, FRACTURES): VITD: 52.67 ng/mL (ref 30.00–100.00)

## 2022-01-30 NOTE — Assessment & Plan Note (Signed)
Disc goals for lipids and reasons to control them Rev last labs with pt Rev low sat fat diet in detail Intol of statins or zetia

## 2022-01-30 NOTE — Progress Notes (Signed)
Subjective:    Patient ID: Erin Hinton, female    DOB: 1946/07/07, 76 y.o.   MRN: [id number]  This visit occurred during the SARS-CoV-2 public health emergency.  Safety protocols were in place, including screening questions prior to the visit, additional usage of staff PPE, and extensive cleaning of exam room while observing appropriate contact time as indicated for disinfecting solutions.   HPI Here for health maintenance exam and to review chronic medical problems    Wt Readings from Last 3 Encounters:  01/30/22 177 lb 9.6 oz (80.6 kg)  11/22/21 180 lb 6.4 oz (81.8 kg)  06/14/21 180 lb 4 oz (81.8 kg)   30.10 kg/m  Balanced diet   Doing ok  Less energy as she gets older -it frustrated   Keeping busy Still picks up grandchild from school and goes to kid's sporting events   Tries to exercise but knees are bothering her /time to get them checked out  Bike motion is hard on them also    Mammogram is scheduled for march Personal h/o breast cancer Self breast exam : no lumps or changes (has exam scheduled mammogram)  Sister had breast cancer   Covid vaccinated Tdap 08/2020 Shingrix utd Pna vaccine utd Flu vaccine utd  Dexa 06/2021  OP  Past aromatase inhibitor  She is unsure about prolia-read about it /ins would pay partially  She is thinking about  Intol of alendronate  Falls: none Fractures: none Supplements : ca and D  Wants to get back on her mvi  Exercise : limited but tries   Depression screen [facility] 2/9 01/30/2022 09/07/2020 09/02/2019 08/21/2018 07/10/2017  Decreased Interest 0 0 0 0 0  Down, Depressed, Hopeless 0 0 0 0 1  PHQ - 2 Score 0 0 0 0 1  Altered sleeping - 0 - 0 -  Tired, decreased energy - 0 - 0 -  Change in appetite - 0 - 0 -  Feeling bad or failure about yourself  - 0 - 0 -  Trouble concentrating - 0 - 0 -  Moving slowly or fidgety/restless - 0 - 0 -  Suicidal thoughts - 0 - 0 -  PHQ-9 Score - 0 - 0 -  Difficult doing work/chores - Not  difficult at all - Not difficult at all -      Colonoscopy 11/2015  HTN bp is stable today  No cp or palpitations or headaches or edema  No side effects to medicines  BP Readings from Last 3 Encounters:  01/30/22 124/76  11/22/21 (!) 164/85  06/14/21 (!) 144/80    Hctz 25 mg daily   Lab Results  Component Value Date   CREATININE 0.80 09/09/2020   BUN 21 09/09/2020   NA 142 09/09/2020   K 4.1 09/09/2020   CL 104 09/09/2020   CO2 33 (H) 09/09/2020    Hyperlipidemia Lab Results  Component Value Date   CHOL 200 11/08/2020   HDL 60.10 11/08/2020   LDLCALC 119 (H) 11/08/2020   LDLDIRECT 128.6 11/28/2010   TRIG 109.0 11/08/2020   CHOLHDL 3 11/08/2020   Zetia -d/c Intol of statins    Prediabetes Lab Results  Component Value Date   HGBA1C 5.7 09/09/2020   Due for labs  Is mindful of sugar and fat in diet    Patient Active Problem List   Diagnosis Date Noted   History of breast cancer 12/13/2020   Vitamin D deficiency 09/19/2020   Medicare annual wellness visit, subsequent 09/02/2019  Mitral prolapse 02/11/2019   Abnormal EKG 02/10/2019   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   Family history of heart disease 07/15/2017   Prediabetes 07/15/2017   Anxiety 07/15/2017   Preoperative cardiovascular examination 04/07/2014   Colon cancer screening 03/09/2013   Routine general medical examination at a health care facility 01/30/2012   HYPERTENSION, BENIGN ESSENTIAL 02/01/2010   Osteoporosis 11/23/2008   Former smoker 10/21/2007   Hyperlipidemia 09/29/2007   MITRAL VALVE PROLAPSE 09/29/2007   FIBROCYSTIC BREAST DISEASE 09/29/2007   ALLERGY 09/29/2007   Past Medical History:  Diagnosis Date   Anxiety    Breast cancer ([place]) 1996   Right breast, Dr Faylene Million, DCIS, lumpectomy only   Cancer ([place])    skin   DCIS (ductal carcinoma in situ) 03/02/2019   Left, 11 mm high-grade DCIS, 10 mm margins. ER positive: 51-90%   Depression    Heart  murmur    in her 30's and it is not a problem now   Hyperlipidemia    Hyperlipidemia    Hypertension    Osteopenia    Personal history of radiation therapy    Shortness of breath dyspnea    due to excercise only   Past Surgical History:  Procedure Laterality Date   BREAST BIOPSY Left 01/27/2019   Affirm Bx- Coil Clip- HIGH-GRADE DUCTAL CARCINOMA IN SITU, WITH COMEDONECROSIS.   BREAST EXCISIONAL BIOPSY Right 01/2002   neg. no lymph node involvement   BREAST EXCISIONAL BIOPSY Left 03/02/2019   lumpectomy DCIS   BREAST LUMPECTOMY Left 03/02/2019   left lumpectomy dcis with NL   BREAST LUMPECTOMY Left 03/02/2019   Procedure: BREAST WIDE EXCISION LEFT, WITH WIRE LOCATION;  Surgeon: Robert Bellow, MD;  Location: [facility] ORS;  Service: General;  Laterality: Left;   BREAST SURGERY  1998   breast biopsy DCIS   CATARACT EXTRACTION W/PHACO Right 05/01/2016   Procedure: CATARACT EXTRACTION PHACO AND INTRAOCULAR LENS PLACEMENT ([place]);  Surgeon: Birder Robson, MD;  Location: [facility] ORS;  Service: Ophthalmology;  Laterality: Right;  [place] 00:53 AP% 21.7 CDE 11.50 fluid pack lot # [id number] H   CATARACT EXTRACTION W/PHACO Left 06/05/2016   Procedure: CATARACT EXTRACTION PHACO AND INTRAOCULAR LENS PLACEMENT ([facility]);  Surgeon: Birder Robson, MD;  Location: [facility] ORS;  Service: Ophthalmology;  Laterality: Left;  [place] 01:04 AP% 22.2 CDE 14.34 fluid pack lot # [id number]   COLONOSCOPY WITH PROPOFOL N/A 11/21/2015   Procedure: COLONOSCOPY WITH PROPOFOL;  Surgeon: Hulen Luster, MD;  Location: [facility] ENDOSCOPY;  Service: Gastroenterology;  Laterality: N/A;   Social History   Tobacco Use   Smoking status: Former    Packs/day: 0.50    Years: 45.00    Pack years: 22.50    Types: Cigarettes, E-cigarettes    Quit date: 01/22/2016    Years since quitting: 6.0   Smokeless tobacco: Never   Tobacco comments:    also uses e-cigaretts on occasion  Vaping Use   Vaping Use: Some days   Substances: Nicotine, Flavoring   Substance Use Topics   Alcohol use: No    Alcohol/week: 0.0 standard drinks   Drug use: No   Family History  Problem Relation Age of Onset   Ovarian cancer Mother    Heart disease Sister    Lung cancer Sister    Lymphoma Father    Heart disease Father        MI   Hypertension Father    Alcohol abuse Brother    Lung cancer Brother  Breast cancer Sister 36   Heart disease Sister    Heart attack Sister    Breast cancer Daughter 32       Theophilus Kinds, LCIS   Breast cancer Paternal Aunt 32   Colon cancer Neg Hx    Allergies  Allergen Reactions   Alendronate Sodium     GI pain   Atorvastatin     Muscle pain   Crestor [Rosuvastatin Calcium]     Muscle pain   Evista [Raloxifene] Other (See Comments)    Breast tenderness   Rosuvastatin Other (See Comments)   Sertraline Hcl     Did not help, made anxiety worse   Pseudoephedrine Palpitations   Current Outpatient Medications on File Prior to Visit  Medication Sig Dispense Refill   acetaminophen (TYLENOL) 500 MG tablet Take 500 mg by mouth every 6 (six) hours as needed (for pain.).     Ascorbic Acid (VITAMIN C WITH ROSE HIPS) 1000 MG tablet Take 1,000 mg by mouth daily.     Calcium Carb-Cholecalciferol (CALCIUM 600 + D PO) Take 1 capsule by mouth daily.     Calcium-Magnesium-Zinc (CAL-MAG-ZINC PO) Take 1 tablet by mouth daily.     Cholecalciferol (VITAMIN D) 50 MCG (2000 UT) tablet Take 2,000 Units by mouth daily.     escitalopram (LEXAPRO) 20 MG tablet TAKE 1 TABLET BY MOUTH EVERY DAY 90 tablet 1   fluticasone (FLONASE) 50 MCG/ACT nasal spray Place 2 sprays into both nostrils daily as needed for allergies. 48 g 3   hydrochlorothiazide (HYDRODIURIL) 25 MG tablet TAKE 1 TABLET BY MOUTH EVERY DAY 90 tablet 1   Omega-3 Fatty Acids (FISH OIL) 1000 MG CAPS Take 1,000 mg by mouth daily.     vitamin E 1000 UNIT capsule Take 1 capsule by mouth daily.     ezetimibe (ZETIA) 10 MG tablet Take 1 tablet (10 mg total) by mouth daily.  (Patient not taking: Reported on 01/30/2022) 90 tablet 3   No current facility-administered medications on file prior to visit.    Review of Systems  Constitutional:  Negative for activity change, appetite change, fatigue, fever and unexpected weight change.  HENT:  Negative for congestion, ear pain, rhinorrhea, sinus pressure and sore throat.   Eyes:  Negative for pain, redness and visual disturbance.  Respiratory:  Negative for cough, shortness of breath and wheezing.   Cardiovascular:  Negative for chest pain and palpitations.  Gastrointestinal:  Negative for abdominal pain, blood in stool, constipation and diarrhea.  Endocrine: Negative for polydipsia and polyuria.  Genitourinary:  Negative for dysuria, frequency and urgency.  Musculoskeletal:  Positive for arthralgias. Negative for back pain and myalgias.  Skin:  Negative for pallor and rash.  Allergic/Immunologic: Negative for environmental allergies.  Neurological:  Negative for dizziness, syncope and headaches.  Hematological:  Negative for adenopathy. Does not bruise/bleed easily.  Psychiatric/Behavioral:  Negative for decreased concentration and dysphoric mood. The patient is not nervous/anxious.       Objective:   Physical Exam Constitutional:      General: She is not in acute distress.    Appearance: Normal appearance. She is well-developed. She is obese. She is not ill-appearing or diaphoretic.  HENT:     Head: Normocephalic and atraumatic.     Right Ear: Tympanic membrane, ear canal and external ear normal.     Left Ear: Tympanic membrane, ear canal and external ear normal.     Nose: Nose normal. No congestion.     Mouth/Throat:  Mouth: Mucous membranes are moist.     Pharynx: Oropharynx is clear. No posterior oropharyngeal erythema.  Eyes:     General: No scleral icterus.    Extraocular Movements: Extraocular movements intact.     Conjunctiva/sclera: Conjunctivae normal.     Pupils: Pupils are equal, round, and  reactive to light.  Neck:     Thyroid: No thyromegaly.     Vascular: No carotid bruit or JVD.  Cardiovascular:     Rate and Rhythm: Normal rate and regular rhythm.     Pulses: Normal pulses.     Heart sounds: Normal heart sounds.    No gallop.  Pulmonary:     Effort: Pulmonary effort is normal. No respiratory distress.     Breath sounds: Normal breath sounds. No wheezing.     Comments: Good air exch Chest:     Chest wall: No tenderness.  Abdominal:     General: Bowel sounds are normal. There is no distension or abdominal bruit.     Palpations: Abdomen is soft. There is no mass.     Tenderness: There is no abdominal tenderness.     Hernia: No hernia is present.  Genitourinary:    Comments: Exam done by her surgeon Musculoskeletal:        General: No tenderness. Normal range of motion.     Cervical back: Normal range of motion and neck supple. No rigidity. No muscular tenderness.     Right lower leg: No edema.     Left lower leg: No edema.  Lymphadenopathy:     Cervical: No cervical adenopathy.  Skin:    General: Skin is warm and dry.     Coloration: Skin is not pale.     Findings: No erythema or rash.     Comments: Solar lentigines diffusely Some sks  Neurological:     Mental Status: She is alert. Mental status is at baseline.     Cranial Nerves: No cranial nerve deficit.     Motor: No abnormal muscle tone.     Coordination: Coordination normal.     Gait: Gait normal.     Deep Tendon Reflexes: Reflexes are normal and symmetric. Reflexes normal.  Psychiatric:        Mood and Affect: Mood normal.        Cognition and Memory: Cognition and memory normal.          Assessment & Plan:   Problem List Items Addressed This Visit       Cardiovascular and Mediastinum   HYPERTENSION, BENIGN ESSENTIAL    bp in fair control at this time  BP Readings from Last 1 Encounters:  01/30/22 124/76  No changes needed Most recent labs reviewed  Disc lifstyle change with low  sodium diet and exercise  Plan to continue hctz 25 mg daily  Labs ordered       Relevant Orders   CBC with Differential/Platelet   Comprehensive metabolic panel   Lipid panel   TSH     Musculoskeletal and Integument   Osteoporosis    dexa 06/2021 rev  Pt is not ready to consider tx (intol of bisph in the past)  May re visit poss of prolia later No falls or fractures No longer smoking Plans to get back to exercise  Takes ca and D D level is pending         Other   Former smoker    Currently vapes prn w/o nicotine      Hyperlipidemia  Disc goals for lipids and reasons to control them Rev last labs with pt Rev low sat fat diet in detail Intol of statins or zetia       Relevant Orders   Lipid panel   Prediabetes    A1C ordered disc imp of low glycemic diet and wt loss to prevent DM2       Relevant Orders   Hemoglobin A1c   Routine general medical examination at a health care facility - Primary    Reviewed health habits including diet and exercise and skin cancer prevention Reviewed appropriate screening tests for age  Also reviewed health mt list, fam hx and immunization status , as well as social and family history   See HPI Labs ordered Mammogram and exam scheduled for march (personal h/o breast cancer) imms utd dexa utd-considering tx for OP/good habits  No longer using nicotine  phq score of 0 Colonoscopy utd       Vitamin D deficiency    D level today  Discussed imp to bone and overall health  Takes 2000 iu D3 daily      Relevant Orders   VITAMIN D 25 Hydroxy (Vit-D Deficiency, Fractures)

## 2022-01-30 NOTE — Assessment & Plan Note (Signed)
bp in fair control at this time  BP Readings from Last 1 Encounters:  01/30/22 124/76   No changes needed Most recent labs reviewed  Disc lifstyle change with low sodium diet and exercise  Plan to continue hctz 25 mg daily  Labs ordered

## 2022-01-30 NOTE — Assessment & Plan Note (Signed)
D level today  Discussed imp to bone and overall health  Takes 2000 iu D3 daily

## 2022-01-30 NOTE — Assessment & Plan Note (Signed)
Reviewed health habits including diet and exercise and skin cancer prevention Reviewed appropriate screening tests for age  Also reviewed health mt list, fam hx and immunization status , as well as social and family history   See HPI Labs ordered Mammogram and exam scheduled for march (personal h/o breast cancer) imms utd dexa utd-considering tx for OP/good habits  No longer using nicotine  phq score of 0 Colonoscopy utd

## 2022-01-30 NOTE — Assessment & Plan Note (Signed)
A1C ordered °disc imp of low glycemic diet and wt loss to prevent DM2  °

## 2022-01-30 NOTE — Assessment & Plan Note (Signed)
dexa 06/2021 rev  Pt is not ready to consider tx (intol of bisph in the past)  May re visit poss of prolia later No falls or fractures No longer smoking Plans to get back to exercise  Takes ca and D D level is pending

## 2022-01-30 NOTE — Assessment & Plan Note (Signed)
Currently vapes prn w/o nicotine

## 2022-01-30 NOTE — Patient Instructions (Signed)
Keep taking care of yourself    Use sun protection  Exercise when /how you can   Get knees checked out when it is time   If you want to try Prolia for osteoporosis let us know  Keep taking calcium and vitamin D   Lab today

## 2022-01-31 LAB — COMPREHENSIVE METABOLIC PANEL
ALT: 8 U/L (ref 0–35)
AST: 16 U/L (ref 0–37)
Albumin: 4.6 g/dL (ref 3.5–5.2)
Alkaline Phosphatase: 55 U/L (ref 39–117)
BUN: 15 mg/dL (ref 6–23)
CO2: 32 mEq/L (ref 19–32)
Calcium: 10.2 mg/dL (ref 8.4–10.5)
Chloride: 101 mEq/L (ref 96–112)
Creatinine, Ser: 0.81 mg/dL (ref 0.40–1.20)
GFR: 71.97 mL/min (ref >60.00)
Glucose, Bld: 98 mg/dL (ref 70–99)
Potassium: 4.1 mEq/L (ref 3.5–5.1)
Sodium: 140 mEq/L (ref 135–145)
Total Bilirubin: 0.5 mg/dL (ref 0.2–1.2)
Total Protein: 7.6 g/dL (ref 6.0–8.3)

## 2022-01-31 LAB — LIPID PANEL
Cholesterol: 257 mg/dL — ABNORMAL HIGH (ref 0–200)
HDL: 74.3 mg/dL (ref >39.00)
LDL Cholesterol: 164 mg/dL — ABNORMAL HIGH (ref 0–99)
NonHDL: 182.57
Total CHOL/HDL Ratio: 3
Triglycerides: 92 mg/dL (ref 0.0–149.0)
VLDL: 18.4 mg/dL (ref 0.0–40.0)

## 2022-02-16 ENCOUNTER — Ambulatory Visit
Admission: RE | Admit: 2022-02-16 | Discharge: 2022-02-16 | Disposition: A | Discharge status: Home / Self Care | Payer: PPO | Source: Ambulatory Visit | Attending: Family Medicine | Admitting: Family Medicine

## 2022-02-16 ENCOUNTER — Other Ambulatory Visit: Payer: Self-pay

## 2022-02-16 DIAGNOSIS — Z1231 Encounter for screening mammogram for malignant neoplasm of breast: Secondary | ICD-10-CM | POA: Diagnosis not present

## 2022-02-22 DIAGNOSIS — Z853 Personal history of malignant neoplasm of breast: Secondary | ICD-10-CM | POA: Diagnosis not present

## 2022-03-22 ENCOUNTER — Other Ambulatory Visit: Payer: Self-pay | Admitting: Family Medicine

## 2022-05-01 DIAGNOSIS — D2261 Melanocytic nevi of right upper limb, including shoulder: Secondary | ICD-10-CM | POA: Diagnosis not present

## 2022-05-01 DIAGNOSIS — D2262 Melanocytic nevi of left upper limb, including shoulder: Secondary | ICD-10-CM | POA: Diagnosis not present

## 2022-05-01 DIAGNOSIS — L570 Actinic keratosis: Secondary | ICD-10-CM | POA: Diagnosis not present

## 2022-05-01 DIAGNOSIS — X32XXXA Exposure to sunlight, initial encounter: Secondary | ICD-10-CM | POA: Diagnosis not present

## 2022-05-01 DIAGNOSIS — D2272 Melanocytic nevi of left lower limb, including hip: Secondary | ICD-10-CM | POA: Diagnosis not present

## 2022-05-01 DIAGNOSIS — Z85828 Personal history of other malignant neoplasm of skin: Secondary | ICD-10-CM | POA: Diagnosis not present

## 2022-05-05 ENCOUNTER — Other Ambulatory Visit: Payer: Self-pay | Admitting: Family Medicine

## 2022-08-07 ENCOUNTER — Encounter: Payer: Self-pay | Admitting: Family Medicine

## 2022-08-07 ENCOUNTER — Telehealth (INDEPENDENT_AMBULATORY_CARE_PROVIDER_SITE_OTHER): Payer: PPO | Admitting: Family Medicine

## 2022-08-07 DIAGNOSIS — U071 COVID-19: Secondary | ICD-10-CM | POA: Diagnosis not present

## 2022-08-07 MED ORDER — NIRMATRELVIR/RITONAVIR (PAXLOVID)TABLET: 3.0000 | ORAL_TABLET | Freq: Two times a day (BID) | ORAL | 0 refills | Status: AC

## 2022-08-07 NOTE — Patient Instructions (Signed)
Drink fluids and rest  mucinex DM is good for cough and congestion  Nasal saline for congestion as needed  Tylenol for fever or pain or headache  Zyrtec or allegra for runny nose  Please alert [place] if symptoms worsen (if severe or short of breath please go to the ER)   Isolate until symptoms are better (5 days minimum) Then mask for an additional 10 days  Take the paxlovid as directed  Update if not starting to improve in a week or if worsening

## 2022-08-07 NOTE — Telephone Encounter (Signed)
Spoke to patient by telephone and was advised that she started with a headache, sore throat and cough Sunday night. Patient stated that she was feeling real bad yesterday so she did a covid test and it was positive. Patient stated that she figures she got it at a get together that she went to last week. Patient denies any SOB or difficulty breathing. Patient scheduled for a virtual visit today 08/07/22 at 4:00 pm with Dr. Glori Bickers. Patient was given ER precautions and verbalized understanding.

## 2022-08-07 NOTE — Assessment & Plan Note (Signed)
Mild to mod symptoms day 2 Discussed anti viral tx, paxlovid sent to pharmacy and handout given  Rev poss side eff Discussed symptom control  Will watch for wheeze or sob ER parameters reviewed  Update if not starting to improve in a week or if worsening  Isolation and masking protocol reviewed as well

## 2022-08-07 NOTE — Progress Notes (Signed)
Virtual Visit via Video Note  I connected with Erin Hinton on 08/07/22 at  4:00 PM EDT by a video enabled telemedicine application and verified that I am speaking with the correct person using two identifiers.  Location: Patient: home Provider: office    I discussed the limitations of evaluation and management by telemedicine and the availability of in person appointments. The patient expressed understanding and agreed to proceed.  Video did not work today -so visit was done by phone   Parties involved in encounter  Patient: Erin Hinton   Provider:  Loura Pardon MD   History of Present Illness:  Pt presents with covid 19   Symptoms started on Sunday night  Tested positive on Monday  Fever: no thermometer , has body aches and chills  ST- a little better than it was yesterday  Headache- frontal  Body aches Cough - phlegm occasionally   No wheezing  No sob  Runny nose and sneezing and congestion  Eyes itch Mucous is clear   No n/v  No diarrhea   No change in taste or smell  No appetite    Otc alt tylenol and ibuprofen   Lab Results  Component Value Date   CREATININE 0.81 01/30/2022   BUN 15 01/30/2022   NA 140 01/30/2022   K 4.1 01/30/2022   CL 101 01/30/2022   CO2 32 01/30/2022   GFR 71.97   Patient Active Problem List   Diagnosis Date Noted   COVID-19 08/07/2022   History of breast cancer 12/13/2020   Vitamin D deficiency 09/19/2020   Medicare annual wellness visit, subsequent 09/02/2019   Mitral prolapse 02/11/2019   Abnormal EKG 02/10/2019   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) 1996   Right breast, Dr Faylene Million, DCIS, lumpectomy only   Cancer ([place])    skin   DCIS (ductal carcinoma in situ) 03/02/2019   Left, 11 mm high-grade DCIS, 10 mm margins. ER positive: 51-90%   Depression    Heart murmur    in her 30's and it is not a problem now   Hyperlipidemia    Hyperlipidemia    Hypertension    Osteopenia    Personal history of radiation therapy    Shortness of breath dyspnea    due to excercise only   Past Surgical History:  Procedure Laterality Date   BREAST BIOPSY Left 01/27/2019   Affirm Bx- Coil Clip- HIGH-GRADE DUCTAL CARCINOMA IN SITU, WITH COMEDONECROSIS.   BREAST EXCISIONAL BIOPSY Right 01/2002   neg. no lymph node involvement   BREAST EXCISIONAL BIOPSY Left 03/02/2019   lumpectomy DCIS   BREAST LUMPECTOMY Left 03/02/2019   left lumpectomy dcis with NL   BREAST LUMPECTOMY Left 03/02/2019   Procedure: BREAST WIDE EXCISION LEFT, WITH WIRE LOCATION;  Surgeon: Robert Bellow, MD;  Location: [facility] ORS;  Service: General;  Laterality: Left;   BREAST SURGERY  1998   breast biopsy DCIS  CATARACT EXTRACTION W/PHACO Right 05/01/2016   Procedure: CATARACT EXTRACTION PHACO AND INTRAOCULAR LENS PLACEMENT (IOC);  Surgeon: Birder Robson, MD;  Location: [facility] ORS;  Service: Ophthalmology;  Laterality: Right;  [place] 00:53 AP% 21.7 CDE 11.50 fluid pack lot # [id number]   CATARACT EXTRACTION W/PHACO Left 06/05/2016   Procedure: CATARACT EXTRACTION PHACO AND INTRAOCULAR LENS PLACEMENT (IOC);  Surgeon: Birder Robson, MD;  Location: [facility] ORS;  Service: Ophthalmology;  Laterality: Left;  [place] 01:04 AP% 22.2 CDE 14.34 fluid pack lot # [id number]   COLONOSCOPY WITH PROPOFOL N/A 11/21/2015   Procedure: COLONOSCOPY WITH PROPOFOL;  Surgeon: Hulen Luster, MD;  Location:  [facility] ENDOSCOPY;  Service: Gastroenterology;  Laterality: N/A;   Social History   Tobacco Use   Smoking status: Former    Packs/day: 0.50    Years: 45.00    Total pack years: 22.50    Types: Cigarettes, E-cigarettes    Quit date: 01/22/2016    Years since quitting: 6.5   Smokeless tobacco: Never   Tobacco comments:    also uses e-cigaretts on occasion  Vaping Use   Vaping Use: Some days   Substances: Nicotine, Flavoring  Substance Use Topics   Alcohol use: No    Alcohol/week: 0.0 standard drinks of alcohol   Drug use: No   Family History  Problem Relation Age of Onset   Ovarian cancer Mother    Heart disease Sister    Lung cancer Sister    Lymphoma Father    Heart disease Father        MI   Hypertension Father    Alcohol abuse Brother    Lung cancer Brother    Breast cancer Sister 28   Heart disease Sister    Heart attack Sister    Breast cancer Daughter 79       Theophilus Kinds, LCIS   Breast cancer Paternal Aunt 46   Colon cancer Neg Hx    Allergies  Allergen Reactions   Alendronate Sodium     GI pain   Atorvastatin     Muscle pain   Crestor [Rosuvastatin Calcium]     Muscle pain   Evista [Raloxifene] Other (See Comments)    Breast tenderness   Rosuvastatin Other (See Comments)   Sertraline Hcl     Did not help, made anxiety worse   Pseudoephedrine Palpitations   Current Outpatient Medications on File Prior to Visit  Medication Sig Dispense Refill   acetaminophen (TYLENOL) 500 MG tablet Take 500 mg by mouth every 6 (six) hours as needed (for pain.).     Ascorbic Acid (VITAMIN C WITH ROSE HIPS) 1000 MG tablet Take 1,000 mg by mouth daily.     Calcium Carb-Cholecalciferol (CALCIUM 600 + D PO) Take 1 capsule by mouth daily.     Calcium-Magnesium-Zinc (CAL-MAG-ZINC PO) Take 1 tablet by mouth daily.     Cholecalciferol (VITAMIN D) 50 MCG (2000 UT) tablet Take 2,000 Units by mouth daily.     escitalopram (LEXAPRO) 20 MG tablet TAKE 1 TABLET BY MOUTH EVERY DAY 90  tablet 1   fluticasone (FLONASE) 50 MCG/ACT nasal spray PLACE 2 SPRAYS INTO BOTH NOSTRILS DAILY AS NEEDED FOR ALLERGIES. 48 mL 3   hydrochlorothiazide (HYDRODIURIL) 25 MG tablet TAKE 1 TABLET BY MOUTH EVERY DAY 90 tablet 1   Omega-3 Fatty Acids (FISH OIL) 1000 MG CAPS Take 1,000 mg by mouth daily.     vitamin E 1000 UNIT capsule Take 1 capsule by mouth daily.  No current facility-administered medications on file prior to visit.   Review of Systems  Constitutional:  Positive for fever and malaise/fatigue. Negative for chills.  HENT:  Positive for congestion. Negative for ear pain, sinus pain and sore throat.   Eyes:  Negative for blurred vision, discharge and redness.  Respiratory:  Positive for cough and sputum production. Negative for shortness of breath, wheezing and stridor.   Cardiovascular:  Negative for chest pain, palpitations and leg swelling.  Gastrointestinal:  Negative for abdominal pain, diarrhea, nausea and vomiting.  Musculoskeletal:  Negative for myalgias.  Skin:  Negative for rash.  Neurological:  Positive for headaches. Negative for dizziness.    Observations/Objective: Voice is slt hoarse Not sob or distressed Does not cough/occ clears throat and sniffles Nl cognition/good historian Nl mood   Assessment and Plan: Problem List Items Addressed This Visit       Other   COVID-19    Mild to mod symptoms day 2 Discussed anti viral tx, paxlovid sent to pharmacy and handout given  Rev poss side eff Discussed symptom control  Will watch for wheeze or sob ER parameters reviewed  Update if not starting to improve in a week or if worsening  Isolation and masking protocol reviewed as well       Relevant Medications   nirmatrelvir/ritonavir EUA (PAXLOVID) 20 x 150 MG & 10 x '100MG'$  TABS     Follow Up Instructions: Drink fluids and rest  mucinex DM is good for cough and congestion  Nasal saline for congestion as needed  Tylenol for fever or pain or headache   Zyrtec or allegra for runny nose  Please alert [place] if symptoms worsen (if severe or short of breath please go to the ER)   Isolate until symptoms are better (5 days minimum) Then mask for an additional 10 days  Take the paxlovid as directed  Update if not starting to improve in a week or if worsening     I discussed the assessment and treatment plan with the patient. The patient was provided an opportunity to ask questions and all were answered. The patient agreed with the plan and demonstrated an understanding of the instructions.   The patient was advised to call back or seek an in-person evaluation if the symptoms worsen or if the condition fails to improve as anticipated.  I provided 18 minutes of non-face-to-face time during this encounter.   Loura Pardon, MD

## 2022-09-12 ENCOUNTER — Ambulatory Visit: Payer: PPO

## 2022-11-15 ENCOUNTER — Other Ambulatory Visit: Payer: Self-pay | Admitting: Family Medicine

## 2022-11-22 ENCOUNTER — Ambulatory Visit: Payer: PPO | Admitting: Radiation Oncology

## 2022-12-05 DIAGNOSIS — Z961 Presence of intraocular lens: Secondary | ICD-10-CM | POA: Diagnosis not present

## 2022-12-06 ENCOUNTER — Other Ambulatory Visit: Payer: Self-pay | Admitting: Family Medicine

## 2022-12-06 ENCOUNTER — Encounter: Payer: Self-pay | Admitting: Radiation Oncology

## 2022-12-06 ENCOUNTER — Ambulatory Visit
Admission: RE | Admit: 2022-12-06 | Discharge: 2022-12-06 | Disposition: A | Discharge status: Home / Self Care | Payer: PPO | Source: Ambulatory Visit | Attending: Radiation Oncology | Admitting: Radiation Oncology

## 2022-12-06 VITALS — BP 168/95 | HR 83 | Temp 97.8°F | Resp 14 | Ht 64.5 in | Wt 176.3 lb

## 2022-12-06 DIAGNOSIS — Z86000 Personal history of in-situ neoplasm of breast: Secondary | ICD-10-CM | POA: Diagnosis not present

## 2022-12-06 DIAGNOSIS — D0512 Intraductal carcinoma in situ of left breast: Secondary | ICD-10-CM

## 2022-12-06 DIAGNOSIS — Z170 Estrogen receptor positive status [ER+]: Secondary | ICD-10-CM | POA: Diagnosis not present

## 2022-12-06 DIAGNOSIS — Z923 Personal history of irradiation: Secondary | ICD-10-CM | POA: Diagnosis not present

## 2022-12-06 DIAGNOSIS — Z1231 Encounter for screening mammogram for malignant neoplasm of breast: Secondary | ICD-10-CM

## 2022-12-06 NOTE — Progress Notes (Signed)
Radiation Oncology Follow up Note  Hinton: Erin Hinton   Date:   12/06/2022 MRN:  [id number] DOB: 1946/03/10    This 76 y.o. female presents to the clinic today for 2-1/2 year follow-up status post whole breast radiation to her left breast for ER positive ductal carcinoma in situ.  REFERRING PROVIDER: Tower, Wynelle Fanny, MD  HPI: Patient is a 76 year old female now out 2 and half years have completed whole breast radiation to her left breast for ER positive ductal carcinoma in situ.  Seen today in routine follow-up she is doing well.  She specifically denies breast tenderness cough or bone pain..  She had mammograms back in March which I have reviewed were BI-RADS 1 negative.  She is not on endocrine therapy.  COMPLICATIONS OF TREATMENT: none  FOLLOW UP COMPLIANCE: keeps appointments   PHYSICAL EXAM:  BP (!) 168/95   Pulse 83   Temp 97.8 F (36.6 C)   Resp 14   Ht 5' 4.5" (1.638 m)   Wt 176 lb 4.8 oz (80 kg)   BMI 29.79 kg/m  Lungs are clear to A&P cardiac examination essentially unremarkable with regular rate and rhythm. No dominant mass or nodularity is noted in either breast in 2 positions examined. Incision is well-healed. No axillary or supraclavicular adenopathy is appreciated. Cosmetic result is excellent.  Well-developed well-nourished patient in NAD. HEENT reveals PERLA, EOMI, discs not visualized.  Oral cavity is clear. No oral mucosal lesions are identified. Neck is clear without evidence of cervical or supraclavicular adenopathy. Lungs are clear to A&P. Cardiac examination is essentially unremarkable with regular rate and rhythm without murmur rub or thrill. Abdomen is benign with no organomegaly or masses noted. Motor sensory and DTR levels are equal and symmetric in the upper and lower extremities. Cranial nerves II through XII are grossly intact. Proprioception is intact. No peripheral adenopathy or edema is identified. No motor or sensory levels are noted. Crude visual  fields are within normal range.  RADIOLOGY RESULTS: Mammograms reviewed compatible with above-stated findings  PLAN: Present time patient is doing well with no evidence of disease now out 2 and half years.  I will see her 1 more time in 1 year for follow-up and then discontinue follow-up care.  Patient knows to call with any concerns she is already I believe scheduled for follow-up mammograms.  I would like to take this opportunity to thank you for allowing me to participate in the care of your patient.Noreene Filbert, MD

## 2023-01-01 DIAGNOSIS — D0461 Carcinoma in situ of skin of right upper limb, including shoulder: Secondary | ICD-10-CM | POA: Diagnosis not present

## 2023-01-01 DIAGNOSIS — L570 Actinic keratosis: Secondary | ICD-10-CM | POA: Diagnosis not present

## 2023-01-01 DIAGNOSIS — D0359 Melanoma in situ of other part of trunk: Secondary | ICD-10-CM | POA: Diagnosis not present

## 2023-01-01 DIAGNOSIS — D485 Neoplasm of uncertain behavior of skin: Secondary | ICD-10-CM | POA: Diagnosis not present

## 2023-01-01 DIAGNOSIS — D2271 Melanocytic nevi of right lower limb, including hip: Secondary | ICD-10-CM | POA: Diagnosis not present

## 2023-01-01 DIAGNOSIS — Z85828 Personal history of other malignant neoplasm of skin: Secondary | ICD-10-CM | POA: Diagnosis not present

## 2023-01-01 DIAGNOSIS — X32XXXA Exposure to sunlight, initial encounter: Secondary | ICD-10-CM | POA: Diagnosis not present

## 2023-01-01 DIAGNOSIS — D2261 Melanocytic nevi of right upper limb, including shoulder: Secondary | ICD-10-CM | POA: Diagnosis not present

## 2023-01-01 DIAGNOSIS — D2262 Melanocytic nevi of left upper limb, including shoulder: Secondary | ICD-10-CM | POA: Diagnosis not present

## 2023-01-01 HISTORY — PX: SKIN BIOPSY: SHX1

## 2023-02-05 ENCOUNTER — Ambulatory Visit (INDEPENDENT_AMBULATORY_CARE_PROVIDER_SITE_OTHER): Payer: PPO

## 2023-02-05 VITALS — Ht 64.5 in | Wt 176.0 lb

## 2023-02-05 DIAGNOSIS — Z0000 Encounter for general adult medical examination without abnormal findings: Secondary | ICD-10-CM

## 2023-02-05 NOTE — Patient Instructions (Signed)
Erin Hinton , Thank you for taking time to come for your Medicare Wellness Visit. I appreciate your ongoing commitment to your health goals. Please review the following plan we discussed and let me know if I can assist you in the future.   These are the goals we discussed:  Goals      Patient Stated     Starting 08/21/2018, I will continue to take medications as prescribed.      Patient Stated     09/07/2020, I will maintain and continue medications as prescribed. I will try to start back walking soon.     Patient Stated     Stay active and healthy.        This is a list of the screening recommended for you and due dates:  Health Maintenance  Topic Date Due   Screening for Lung Cancer  Never done   COVID-19 Vaccine (5 - 2023-24 season) 08/17/2022   Mammogram  02/17/2023   Medicare Annual Wellness Visit  02/06/2024   Colon Cancer Screening  11/20/2025   DTaP/Tdap/Td vaccine (4 - Td or Tdap) 08/26/2030   Pneumonia Vaccine  Completed   Flu Shot  Completed   DEXA scan (bone density measurement)  Completed   Hepatitis C Screening: USPSTF Recommendation to screen - Ages 26-79 yo.  Completed   Zoster (Shingles) Vaccine  Completed   HPV Vaccine  Aged Out    Advanced directives: Please bring a copy of your health care power of attorney and living will to the office to be added to your chart at your convenience.   Conditions/risks identified: Aim for 30 minutes of exercise or brisk walking, 6-8 glasses of water, and 5 servings of fruits and vegetables each day.   Next appointment: Follow up in one year for your annual wellness visit 02/10/24 @ 11:00 via telephone   Preventive Care 65 Years and Older, Female Preventive care refers to lifestyle choices and visits with your health care provider that can promote health and wellness. What does preventive care include? A yearly physical exam. This is also called an annual well check. Dental exams once or twice a year. Routine eye exams.  Ask your health care provider how often you should have your eyes checked. Personal lifestyle choices, including: Daily care of your teeth and gums. Regular physical activity. Eating a healthy diet. Avoiding tobacco and drug use. Limiting alcohol use. Practicing safe sex. Taking low-dose aspirin every day. Taking vitamin and mineral supplements as recommended by your health care provider. What happens during an annual well check? The services and screenings done by your health care provider during your annual well check will depend on your age, overall health, lifestyle risk factors, and family history of disease. Counseling  Your health care provider may ask you questions about your: Alcohol use. Tobacco use. Drug use. Emotional well-being. Home and relationship well-being. Sexual activity. Eating habits. History of falls. Memory and ability to understand (cognition). Work and work Statistician. Reproductive health. Screening  You may have the following tests or measurements: Height, weight, and BMI. Blood pressure. Lipid and cholesterol levels. These may be checked every 5 years, or more frequently if you are over 64 years old. Skin check. Lung cancer screening. You may have this screening every year starting at age 66 if you have a 30-pack-year history of smoking and currently smoke or have quit within the past 15 years. Fecal occult blood test (FOBT) of the stool. You may have this test every year starting  at age 32. Flexible sigmoidoscopy or colonoscopy. You may have a sigmoidoscopy every 5 years or a colonoscopy every 10 years starting at age 47. Hepatitis C blood test. Hepatitis B blood test. Sexually transmitted disease (STD) testing. Diabetes screening. This is done by checking your blood sugar (glucose) after you have not eaten for a while (fasting). You may have this done every 1-3 years. Bone density scan. This is done to screen for osteoporosis. You may have this  done starting at age 78. Mammogram. This may be done every 1-2 years. Talk to your health care provider about how often you should have regular mammograms. Talk with your health care provider about your test results, treatment options, and if necessary, the need for more tests. Vaccines  Your health care provider may recommend certain vaccines, such as: Influenza vaccine. This is recommended every year. Tetanus, diphtheria, and acellular pertussis (Tdap, Td) vaccine. You may need a Td booster every 10 years. Zoster vaccine. You may need this after age 59. Pneumococcal 13-valent conjugate (PCV13) vaccine. One dose is recommended after age 48. Pneumococcal polysaccharide (PPSV23) vaccine. One dose is recommended after age 67. Talk to your health care provider about which screenings and vaccines you need and how often you need them. This information is not intended to replace advice given to you by your health care provider. Make sure you discuss any questions you have with your health care provider. Document Released: 12/30/2015 Document Revised: 08/22/2016 Document Reviewed: 10/04/2015 Elsevier Interactive Patient Education  2017 [place] Prevention in the Home Falls can cause injuries. They can happen to people of all ages. There are many things you can do to make your home safe and to help prevent falls. What can I do on the outside of my home? Regularly fix the edges of walkways and driveways and fix any cracks. Remove anything that might make you trip as you walk through a door, such as a raised step or threshold. Trim any bushes or trees on the path to your home. Use bright outdoor lighting. Clear any walking paths of anything that might make someone trip, such as rocks or tools. Regularly check to see if handrails are loose or broken. Make sure that both sides of any steps have handrails. Any raised decks and porches should have guardrails on the edges. Have any leaves,  snow, or ice cleared regularly. Use sand or salt on walking paths during winter. Clean up any spills in your garage right away. This includes oil or grease spills. What can I do in the bathroom? Use night lights. Install grab bars by the toilet and in the tub and shower. Do not use towel bars as grab bars. Use non-skid mats or decals in the tub or shower. If you need to sit down in the shower, use a plastic, non-slip stool. Keep the floor dry. Clean up any water that spills on the floor as soon as it happens. Remove soap buildup in the tub or shower regularly. Attach bath mats securely with double-sided non-slip rug tape. Do not have throw rugs and other things on the floor that can make you trip. What can I do in the bedroom? Use night lights. Make sure that you have a light by your bed that is easy to reach. Do not use any sheets or blankets that are too big for your bed. They should not hang down onto the floor. Have a firm chair that has side arms. You can use this for support  while you get dressed. Do not have throw rugs and other things on the floor that can make you trip. What can I do in the kitchen? Clean up any spills right away. Avoid walking on wet floors. Keep items that you use a lot in easy-to-reach places. If you need to reach something above you, use a strong step stool that has a grab bar. Keep electrical cords out of the way. Do not use floor polish or wax that makes floors slippery. If you must use wax, use non-skid floor wax. Do not have throw rugs and other things on the floor that can make you trip. What can I do with my stairs? Do not leave any items on the stairs. Make sure that there are handrails on both sides of the stairs and use them. Fix handrails that are broken or loose. Make sure that handrails are as long as the stairways. Check any carpeting to make sure that it is firmly attached to the stairs. Fix any carpet that is loose or worn. Avoid having throw  rugs at the top or bottom of the stairs. If you do have throw rugs, attach them to the floor with carpet tape. Make sure that you have a light switch at the top of the stairs and the bottom of the stairs. If you do not have them, ask someone to add them for you. What else can I do to help prevent falls? Wear shoes that: Do not have high heels. Have rubber bottoms. Are comfortable and fit you well. Are closed at the toe. Do not wear sandals. If you use a stepladder: Make sure that it is fully opened. Do not climb a closed stepladder. Make sure that both sides of the stepladder are locked into place. Ask someone to hold it for you, if possible. Clearly mark and make sure that you can see: Any grab bars or handrails. First and last steps. Where the edge of each step is. Use tools that help you move around (mobility aids) if they are needed. These include: Canes. Walkers. Scooters. Crutches. Turn on the lights when you go into a dark area. Replace any light bulbs as soon as they burn out. Set up your furniture so you have a clear path. Avoid moving your furniture around. If any of your floors are uneven, fix them. If there are any pets around you, be aware of where they are. Review your medicines with your doctor. Some medicines can make you feel dizzy. This can increase your chance of falling. Ask your doctor what other things that you can do to help prevent falls. This information is not intended to replace advice given to you by your health care provider. Make sure you discuss any questions you have with your health care provider. Document Released: 09/29/2009 Document Revised: 05/10/2016 Document Reviewed: 01/07/2015 Elsevier Interactive Patient Education  2017 [employer].

## 2023-02-05 NOTE — Progress Notes (Signed)
I connected with  Doran Heater on 02/05/23 by a audio enabled telemedicine application and verified that I am speaking with the correct person using two identifiers.  Patient Location: Home  Provider Location: Home Office  I discussed the limitations of evaluation and management by telemedicine. The patient expressed understanding and agreed to proceed.  Subjective:   Erin Hinton is a 77 y.o. female who presents for Medicare Annual (Subsequent) preventive examination.  Review of Systems     Cardiac Risk Factors include: advanced age (>14mn, >>2women);hypertension;dyslipidemia     Objective:    Today's Vitals   02/05/23 0938 02/05/23 0939  Weight: 176 lb (79.8 kg)   Height: 5' 4.5" (1.638 m)   PainSc:  0-No pain   Body mass index is 29.74 kg/m.     02/05/2023    9:50 AM 12/06/2022    9:46 AM 11/22/2021   10:56 AM 11/15/2020   11:29 AM 09/07/2020   11:15 AM 11/18/2019   10:56 AM 08/06/2019   11:13 AM  Advanced Directives  Does Patient Have a Medical Advance Directive? Yes Yes No Yes Yes No Yes  Type of AParamedicof AOld West[place]Living will   HJoppaLiving will H[place] RioLiving will  Living will  Does patient want to make changes to medical advance directive?  No - Patient declined  No - Patient declined   No - Patient declined  Copy of HSt. Clair Shoresin Chart? No - copy requested   No - copy requested No - copy requested  No - copy requested  Would patient like information on creating a medical advance directive?   No - Patient declined   No - Patient declined     Current Medications (verified) Outpatient Encounter Medications as of 02/05/2023  Medication Sig   acetaminophen (TYLENOL) 500 MG tablet Take 500 mg by mouth every 6 (six) hours as needed (for pain.).   Ascorbic Acid (VITAMIN C WITH ROSE HIPS) 1000 MG tablet Take 1,000 mg by mouth daily.   Calcium Carb-Cholecalciferol  (CALCIUM 600 + D PO) Take 1 capsule by mouth daily.   Calcium-Magnesium-Zinc (CAL-MAG-ZINC PO) Take 1 tablet by mouth daily.   Cholecalciferol (VITAMIN D) 50 MCG (2000 UT) tablet Take 2,000 Units by mouth daily.   escitalopram (LEXAPRO) 20 MG tablet TAKE 1 TABLET BY MOUTH EVERY DAY   fluticasone (FLONASE) 50 MCG/ACT nasal spray PLACE 2 SPRAYS INTO BOTH NOSTRILS DAILY AS NEEDED FOR ALLERGIES.   hydrochlorothiazide (HYDRODIURIL) 25 MG tablet TAKE 1 TABLET BY MOUTH EVERY DAY   Omega-3 Fatty Acids (FISH OIL) 1000 MG CAPS Take 1,000 mg by mouth daily.   vitamin E 1000 UNIT capsule Take 1 capsule by mouth daily.   No facility-administered encounter medications on file as of 02/05/2023.    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 ER positive: 51-90%   Depression    Heart murmur    in her 30's and it is not a problem now   Hyperlipidemia    Hyperlipidemia    Hypertension    Osteopenia    Personal history of radiation  therapy    Shortness of breath dyspnea    due to excercise only   Past Surgical History:  Procedure Laterality Date   BREAST BIOPSY Left 01/27/2019   Affirm Bx- Coil Clip- HIGH-GRADE DUCTAL CARCINOMA IN SITU, WITH COMEDONECROSIS.   BREAST EXCISIONAL BIOPSY Right 01/2002   neg. no lymph node involvement   BREAST EXCISIONAL BIOPSY Left 03/02/2019   lumpectomy DCIS   BREAST LUMPECTOMY Left 03/02/2019   left lumpectomy dcis with NL   BREAST LUMPECTOMY Left 03/02/2019   Procedure: BREAST WIDE EXCISION LEFT, WITH WIRE LOCATION;  Surgeon: Robert Bellow, MD;  Location: [facility] ORS;  Service: General;   Laterality: Left;   BREAST SURGERY  1998   breast biopsy DCIS   CATARACT EXTRACTION W/PHACO Right 05/01/2016   Procedure: CATARACT EXTRACTION PHACO AND INTRAOCULAR LENS PLACEMENT ([place]);  Surgeon: Birder Robson, MD;  Location: [facility] ORS;  Service: Ophthalmology;  Laterality: Right;  [place] 00:53 AP% 21.7 CDE 11.50 fluid pack lot # [id number]   CATARACT EXTRACTION W/PHACO Left 06/05/2016   Procedure: CATARACT EXTRACTION PHACO AND INTRAOCULAR LENS PLACEMENT ([facility]);  Surgeon: Birder Robson, MD;  Location: [facility] ORS;  Service: Ophthalmology;  Laterality: Left;  [place] 01:04 AP% 22.2 CDE 14.34 fluid pack lot # [id number]   COLONOSCOPY WITH PROPOFOL N/A 11/21/2015   Procedure: COLONOSCOPY WITH PROPOFOL;  Surgeon: Hulen Luster, MD;  [facility] END[facility]COPY;  Service: Gastroenterology;  Laterality: N/A;   SKIN BIOPSY  01/01/2023   Upper Back   Family History  Problem Relation Age of Onset   Ovarian cancer Mother    Heart disease Sister    Lung cancer Sister    Lymphoma Father    Heart disease Father        MI   Hypertension Father    Alcohol abuse Brother    Lung cancer Brother    Breast cancer Sister 36   Heart disease Sister    Heart attack Sister    Breast cancer Daughter 46       Theophilus Kinds, LCIS   Breast cancer Paternal Aunt 42   Colon cancer Neg Hx    Social History   Socioeconomic History   Marital status: Widowed    Spouse name: Not on file   Number of children: Not on file   Years of education: Not on file   Highest education level: Not on file  Occupational History   Not on file  Tobacco Use   Smoking status: Former    Packs/day: 0.50    Years: 45.00    Total pack years: 22.50    Types: Cigarettes, E-cigarettes    Quit date: 01/22/2016    Years since quitting: 7.0   Smokeless tobacco: Never   Tobacco comments:    also uses e-cigaretts on occasion  Vaping Use   Vaping Use: Some days   Substances: Nicotine, Flavoring  Substance and Sexual Activity   Alcohol use: No     Alcohol/week: 0.0 standard drinks of alcohol   Drug use: No   Sexual activity: Never  Other Topics Concern   Not on file  Social History Narrative   Not on file   Social Determinants of Health   Financial Resource Strain: Low Risk  (02/05/2023)   Overall Financial Resource Strain (CARDIA)    Difficulty of Paying Living Expenses: Not hard at all  Food Insecurity: No Food Insecurity (02/05/2023)   Hunger Vital Sign    Worried About Running Out of Food in the Last Year: Never  true    Ran Out of Food in the Last Year: Never true  Transportation Needs: No Transportation Needs (02/05/2023)   PRAPARE - Hydrologist (Medical): No    Lack of Transportation (Non-Medical): No  Physical Activity: Insufficiently Active (02/05/2023)   Exercise Vital Sign    Days of Exercise per Week: 2 days    Minutes of Exercise per Session: 30 min  Stress: No Stress Concern Present (02/05/2023)   [place]    Feeling of Stress : Not at all  Social Connections: Socially Isolated (02/05/2023)   Social Connection and Isolation Panel [NHANES]    Frequency of Communication with Friends and Family: More than three times a week    Frequency of Social Gatherings with Friends and Family: More than three times a week    Attends Religious Services: Never    Marine scientist or Organizations: No    Attends Archivist Meetings: Never    Marital Status: Widowed    Tobacco Counseling Counseling given: Not Answered Tobacco comments: also uses e-cigaretts on occasion   Clinical Intake:  Pre-visit preparation completed: Yes  Pain : No/denies pain Pain Score: 0-No pain     Nutritional Risks: None Diabetes: No  How often do you need to have someone help you when you read instructions, pamphlets, or other written materials from your doctor or pharmacy?: 1 - Never  Diabetic?no  Interpreter Needed?:  No  Information entered by :: C.Loza Prell LPN   Activities of Daily Living    02/05/2023    9:52 AM  In your present state of health, do you have any difficulty performing the following activities:  Hearing? 0  Vision? 0  Difficulty concentrating or making decisions? 0  Walking or climbing stairs? 1  Comment Bad knees  Dressing or bathing? 0  Doing errands, shopping? 0  Preparing Food and eating ? N  Using the Toilet? N  In the past six months, have you accidently leaked urine? Y  Comment occasional, wears pad  Do you have problems with loss of bowel control? N  Managing your Medications? N  Managing your Finances? N  Housekeeping or managing your Housekeeping? N    Patient Care Team: [place], Wynelle Fanny, MD as PCP - General Birder Robson, MD as Referring Physician (Ophthalmology)  Indicate any recent Medical Services you may have received from other than [facility] providers in the past year (date may be approximate).     Assessment:   This is a routine wellness examination for [place].  Hearing/Vision screen Hearing Screening - Comments:: No aids Vision Screening - Comments:: Wears glasses -  Eye  Dietary issues and exercise activities discussed: Current Exercise Habits: Home exercise routine, Type of exercise: walking, Time (Minutes): 30, Frequency (Times/Week): 3, Weekly Exercise (Minutes/Week): 90, Intensity: Mild, Exercise limited by: None identified   Goals Addressed             This Visit's Progress    Patient Stated       Stay active and healthy.       Depression Screen    02/05/2023    9:47 AM 01/30/2022   10:29 AM 09/07/2020   11:16 AM 09/02/2019    9:19 AM 08/21/2018   10:41 AM 07/10/2017   10:00 AM 06/26/2016    2:17 PM  PHQ 2/9 Scores  PHQ - 2 Score 0 0 0 0 0 1 0  PHQ- 9 Score  0  0      Fall Risk    02/05/2023    9:52 AM 08/07/2022    3:52 PM 01/30/2022   10:29 AM 09/07/2020   11:16 AM 07/12/2020    1:34 PM  Fall Risk   Falls in the  past year? 0 0 0 0 0  Comment     Emmi Telephone Survey: data to providers prior to load  Number falls in past yr: 0  0 0   Injury with Fall? 0  0 0   Risk for fall due to : No Fall Risks  No Fall Risks Medication side effect   Follow up Falls prevention discussed;Falls evaluation completed Falls evaluation completed Falls evaluation completed Falls evaluation completed;Falls prevention discussed     FALL RISK PREVENTION PERTAINING TO THE HOME:  Any stairs in or around the home? Yes  If so, are there any without handrails? Yes  Home free of loose throw rugs in walkways, pet beds, electrical cords, etc? Yes  Adequate lighting in your home to reduce risk of falls? Yes   ASSISTIVE DEVICES UTILIZED TO PREVENT FALLS:  Life alert? No  Use of a cane, walker or w/c? No  Grab bars in the bathroom? No  Shower chair or bench in shower? No  Elevated toilet seat or a handicapped toilet? Yes    Cognitive Function:    09/07/2020   11:18 AM 08/21/2018   10:41 AM 07/10/2017   10:01 AM 06/26/2016    2:00 PM  MMSE - Mini Mental State Exam  Orientation to time 5 5 5 5  $ Orientation to Place 5 5 5 5  $ Registration 3 3 3 3  $ Attention/ Calculation 5 0 0 0  Recall 3 3 2 3  $ Language- name 2 objects  0 0 0  Language- repeat 1 1 1 1  $ Language- follow 3 step command  3 3 3  $ Language- read & follow direction  0 0 0  Write a sentence  0 0 0  Copy design  0 0 0  Total score  20 19 20        $ 02/05/2023   10:03 AM  6CIT Screen  What Year? 0 points  What month? 0 points  What time? 0 points  Count back from 20 0 points  Months in reverse 0 points  Repeat phrase 0 points  Total Score 0 points    Immunizations Immunization History  Administered Date(s) Administered   Fluad Quad(high Dose 65+) 08/26/2019, 09/27/2022   Influenza Whole 09/16/2008, 11/10/2009, 09/26/2010   Influenza, High Dose Seasonal PF 09/11/2014, 09/21/2015, 07/23/2016, 08/20/2017, 09/17/2018, 08/26/2020, 09/14/2021    Influenza-Unspecified 09/16/2013   PFIZER Comirnaty(Gray Top)Covid-19 Tri-Sucrose Vaccine 03/31/2021   PFIZER(Purple Top)SARS-COV-2 Vaccination 01/07/2020, 01/28/2020, 10/28/2020   PNEUMOCOCCAL CONJUGATE-20 03/31/2021   Pneumococcal Conjugate-13 05/17/2015   Pneumococcal Polysaccharide-23 03/24/2004, 11/24/2009, 04/07/2014   Respiratory Syncytial Virus Vaccine,Recomb Aduvanted(Arexvy) 11/29/2022   Td 09/09/2006, 09/14/2009   Tdap 08/26/2020   Zoster Recombinat (Shingrix) 05/30/2020, 08/01/2020   Zoster, Live 12/06/2010    TDAP status: Up to date  Flu Vaccine status: Up to date  Pneumococcal vaccine status: Up to date  Covid-19 vaccine status: Information provided on how to obtain vaccines.   Qualifies for Shingles Vaccine? No   Zostavax completed No   Shingrix Completed?: Yes  Screening Tests Health Maintenance  Topic Date Due   Lung Cancer Screening  Never done   COVID-19 Vaccine (5 - 2023-24 season) 08/17/2022   MAMMOGRAM  02/17/2023  Medicare Annual Wellness (AWV)  02/06/2024   COLONOSCOPY (Pts 45-38yr Insurance coverage will need to be confirmed)  11/20/2025   DTaP/Tdap/Td (4 - Td or Tdap) 08/26/2030   Pneumonia Vaccine 75 Years old  Completed   INFLUENZA VACCINE  Completed   DEXA SCAN  Completed   Hepatitis C Screening  Completed   Zoster Vaccines- Shingrix  Completed   HPV VACCINES  Aged Out    Health Maintenance  Health Maintenance Due  Topic Date Due   Lung Cancer Screening  Never done   COVID-19 Vaccine (5 - 2023-24 season) 08/17/2022    Colorectal cancer screening: No longer required. Pt Declined.  Mammogram status: Completed 02/16/2022. Repeat every year scheduled 02/18/2023  Bone Density : Pt declined last performed 07/03/2021 showed osteoporosis.  Lung Cancer Screening: (Low Dose CT Chest recommended if Age 75-80years, 30 pack-year currently smoking OR have quit w/in 15years.) does qualify.   Lung Cancer Screening Referral:  Declines  Additional Screening:  Hepatitis C Screening: does not qualify; Completed 06/26/2016  Vision Screening: Recommended annual ophthalmology exams for early detection of glaucoma and other disorders of the eye. Is the patient up to date with their annual eye exam?  Yes  Who is the provider or what is the name of the office in which the patient attends annual eye exams? [place] is not established with a provider, would they like to be referred to a provider to establish care? No .   Dental Screening: Recommended annual dental exams for proper oral hygiene  Community Resource Referral / Chronic Care Management: CRR required this visit?  No   CCM required this visit?  No      Plan:     I have personally reviewed and noted the following in the patient's chart:   Medical and social history Use of alcohol, tobacco or illicit drugs  Current medications and supplements including opioid prescriptions. Patient is not currently taking opioid prescriptions. Functional ability and status Nutritional status Physical activity Advanced directives List of other physicians Hospitalizations, surgeries, and ER visits in previous 12 months Vitals Screenings to include cognitive, depression, and falls Referrals and appointments  In addition, I have reviewed and discussed with patient certain preventive protocols, quality metrics, and best practice recommendations. A written personalized care plan for preventive services as well as general preventive health recommendations were provided to patient.     CLebron Conners LPN   [id number]  Nurse Notes: none

## 2023-02-10 ENCOUNTER — Other Ambulatory Visit: Payer: Self-pay | Admitting: Family Medicine

## 2023-02-11 NOTE — Telephone Encounter (Signed)
Please call patient to set up cpe let me know when scheduled and will send for refill.

## 2023-02-11 NOTE — Telephone Encounter (Signed)
Spoke to pt, scheduled cpe for 02/20/23

## 2023-02-14 DIAGNOSIS — D0359 Melanoma in situ of other part of trunk: Secondary | ICD-10-CM | POA: Diagnosis not present

## 2023-02-18 ENCOUNTER — Ambulatory Visit
Admission: RE | Admit: 2023-02-18 | Discharge: 2023-02-18 | Disposition: A | Discharge status: Home / Self Care | Payer: PPO | Source: Ambulatory Visit | Attending: Family Medicine | Admitting: Family Medicine

## 2023-02-18 DIAGNOSIS — Z1231 Encounter for screening mammogram for malignant neoplasm of breast: Secondary | ICD-10-CM | POA: Insufficient documentation

## 2023-02-20 ENCOUNTER — Ambulatory Visit (INDEPENDENT_AMBULATORY_CARE_PROVIDER_SITE_OTHER): Payer: PPO | Admitting: Family Medicine

## 2023-02-20 ENCOUNTER — Encounter: Payer: Self-pay | Admitting: Family Medicine

## 2023-02-20 VITALS — BP 132/80 | HR 83 | Temp 98.1°F | Ht 64.5 in | Wt 175.0 lb

## 2023-02-20 DIAGNOSIS — E559 Vitamin D deficiency, unspecified: Secondary | ICD-10-CM | POA: Diagnosis not present

## 2023-02-20 DIAGNOSIS — Z0000 Encounter for general adult medical examination without abnormal findings: Secondary | ICD-10-CM | POA: Diagnosis not present

## 2023-02-20 DIAGNOSIS — Z1211 Encounter for screening for malignant neoplasm of colon: Secondary | ICD-10-CM

## 2023-02-20 DIAGNOSIS — I10 Essential (primary) hypertension: Secondary | ICD-10-CM

## 2023-02-20 DIAGNOSIS — M810 Age-related osteoporosis without current pathological fracture: Secondary | ICD-10-CM | POA: Diagnosis not present

## 2023-02-20 DIAGNOSIS — Z853 Personal history of malignant neoplasm of breast: Secondary | ICD-10-CM | POA: Diagnosis not present

## 2023-02-20 DIAGNOSIS — F419 Anxiety disorder, unspecified: Secondary | ICD-10-CM | POA: Diagnosis not present

## 2023-02-20 DIAGNOSIS — R7303 Prediabetes: Secondary | ICD-10-CM

## 2023-02-20 DIAGNOSIS — E7800 Pure hypercholesterolemia, unspecified: Secondary | ICD-10-CM | POA: Diagnosis not present

## 2023-02-20 LAB — COMPREHENSIVE METABOLIC PANEL
ALT: 14 U/L (ref 0–35)
AST: 19 U/L (ref 0–37)
Albumin: 3.9 g/dL (ref 3.5–5.2)
Alkaline Phosphatase: 57 U/L (ref 39–117)
BUN: 15 mg/dL (ref 6–23)
CO2: 31 mEq/L (ref 19–32)
Calcium: 10 mg/dL (ref 8.4–10.5)
Chloride: 101 mEq/L (ref 96–112)
Creatinine, Ser: 0.75 mg/dL (ref 0.40–1.20)
GFR: 78.35 mL/min (ref >60.00)
Glucose, Bld: 84 mg/dL (ref 70–99)
Potassium: 4.3 mEq/L (ref 3.5–5.1)
Sodium: 140 mEq/L (ref 135–145)
Total Bilirubin: 0.5 mg/dL (ref 0.2–1.2)
Total Protein: 6.4 g/dL (ref 6.0–8.3)

## 2023-02-20 LAB — CBC WITH DIFFERENTIAL/PLATELET
Basophils Absolute: 0 10*3/uL (ref 0.0–0.1)
Basophils Relative: 0.4 % (ref 0.0–3.0)
Eosinophils Absolute: 0.1 10*3/uL (ref 0.0–0.7)
Eosinophils Relative: 2.6 % (ref 0.0–5.0)
HCT: 40.3 % (ref 36.0–46.0)
Hemoglobin: 13.4 g/dL (ref 12.0–15.0)
Lymphocytes Relative: 17.2 % (ref 12.0–46.0)
Lymphs Abs: 0.9 10*3/uL (ref 0.7–4.0)
MCHC: 33.2 g/dL (ref 30.0–36.0)
MCV: 97.2 fl (ref 78.0–100.0)
Monocytes Absolute: 0.5 10*3/uL (ref 0.1–1.0)
Monocytes Relative: 8.4 % (ref 3.0–12.0)
Neutro Abs: 3.9 10*3/uL (ref 1.4–7.7)
Neutrophils Relative %: 71.4 % (ref 43.0–77.0)
Platelets: 235 10*3/uL (ref 150.0–400.0)
RBC: 4.14 Mil/uL (ref 3.87–5.11)
RDW: 12.6 % (ref 11.5–15.5)
WBC: 5.4 10*3/uL (ref 4.0–10.5)

## 2023-02-20 LAB — HEMOGLOBIN A1C: Hgb A1c MFr Bld: 5.6 % (ref 4.6–6.5)

## 2023-02-20 LAB — LIPID PANEL
Cholesterol: 188 mg/dL (ref 0–200)
HDL: 57.9 mg/dL (ref >39.00)
LDL Cholesterol: 109 mg/dL — ABNORMAL HIGH (ref 0–99)
NonHDL: 129.82
Total CHOL/HDL Ratio: 3
Triglycerides: 102 mg/dL (ref 0.0–149.0)
VLDL: 20.4 mg/dL (ref 0.0–40.0)

## 2023-02-20 LAB — TSH: TSH: 1.26 u[IU]/mL (ref 0.35–5.50)

## 2023-02-20 LAB — VITAMIN D 25 HYDROXY (VIT D DEFICIENCY, FRACTURES): VITD: 39.41 ng/mL (ref 30.00–100.00)

## 2023-02-20 NOTE — Assessment & Plan Note (Signed)
Reviewed health habits including diet and exercise and skin cancer prevention Reviewed appropriate screening tests for age  Also reviewed health mt list, fam hx and immunization status , as well as social and family history   See HPI Labs ordered  Mammogram utd-pending report from last week  Has rad onc f/u and breast exam yearly/ recent  Utd derm care  Dexa due in July, no falls or fx and takes D  Enc strength training  Colonoscopy utd 2016 PHQ 0

## 2023-02-20 NOTE — Progress Notes (Signed)
Subjective:    Patient ID: Erin Hinton, female    DOB: [birth year], 77 y.o.   MRN: [id number]  HPI Here for health maintenance exam and to review chronic medical problems    Wt Readings from Last 3 Encounters:  02/20/23 175 lb (79.4 kg)  02/05/23 176 lb (79.8 kg)  12/06/22 176 lb 4.8 oz (80 kg)   29.57 kg/m  Had a cold recently  Finally felt better over the weekend Had neg covid test    Immunization History  Administered Date(s) Administered   Fluad Quad(high Dose 65+) 08/26/2019, 09/27/2022   Influenza Whole 09/16/2008, 11/10/2009, 09/26/2010   Influenza, High Dose Seasonal PF 09/11/2014, 09/21/2015, 07/23/2016, 08/20/2017, 09/17/2018, 08/26/2020, 09/14/2021   Influenza-Unspecified 09/16/2013   PFIZER Comirnaty(Gray Top)Covid-19 Tri-Sucrose Vaccine 03/31/2021   PFIZER(Purple Top)SARS-COV-2 Vaccination 01/07/2020, 01/28/2020, 10/28/2020   PNEUMOCOCCAL CONJUGATE-20 03/31/2021   Pneumococcal Conjugate-13 05/17/2015   Pneumococcal Polysaccharide-23 03/24/2004, 11/24/2009, 04/07/2014   Respiratory Syncytial Virus Vaccine,Recomb Aduvanted(Arexvy) 11/29/2022   Td 09/09/2006, 09/14/2009   Tdap 08/26/2020   Zoster Recombinat (Shingrix) 05/30/2020, 08/01/2020   Zoster, Live 12/06/2010   Health Maintenance Due  Topic Date Due   Lung Cancer Screening  Never done   COVID-19 Vaccine (5 - 2023-24 season) 08/17/2022   MAMMOGRAM  02/17/2023   Mammogram 02/2023- waiting to be read  Self breast exam : no lumps  Personal h/o breast cancer- had breast check with Dr Donella Stade in [place]  Was ok   Had derm procedure on back for basal cell lesion  Has re check next week /still bandaged Has area on R hand to treat also   Is very good about sun protection now    Dexa 06/2021   OP Declined tx in the past  Falls-none  Fractures-none  Supplements - ca and D Exercise - not as much as she would like  Walks for short periods  She lifts some heavy stuff / no intentional training yet       Colonoscopy 11/2015  PHQ    02/05/2023    9:47 AM 01/30/2022   10:29 AM 09/07/2020   11:16 AM 09/02/2019    9:19 AM 08/21/2018   10:41 AM  Depression screen PHQ 2/9  Decreased Interest 0 0 0 0 0  Down, Depressed, Hopeless 0 0 0 0 0  PHQ - 2 Score 0 0 0 0 0  Altered sleeping   0  0  Tired, decreased energy   0  0  Change in appetite   0  0  Feeling bad or failure about yourself    0  0  Trouble concentrating   0  0  Moving slowly or fidgety/restless   0  0  Suicidal thoughts   0  0  PHQ-9 Score   0  0  Difficult doing work/chores   Not difficult at all  Not difficult at all      Lexapro works well/wants to stay on it     HTN bp is stable today  No cp or palpitations or headaches or edema  No side effects to medicines  BP Readings from Last 3 Encounters:  02/20/23 134/86  12/06/22 (!) 168/95  01/30/22 124/76   Hctz 25 mg daily   Lab Results  Component Value Date   CREATININE 0.81 01/30/2022   BUN 15 01/30/2022   NA 140 01/30/2022   K 4.1 01/30/2022   CL 101 01/30/2022   CO2 32 01/30/2022   Due for labs   Due for  lipid labs Intol of statins or zetia   Prediabetes Lab Results  Component Value Date   HGBA1C 5.5 01/30/2022   Lab Results  Component Value Date   CHOL 257 (H) 01/30/2022   HDL 74.30 01/30/2022   LDLCALC 164 (H) 01/30/2022   LDLDIRECT 128.6 11/28/2010   TRIG 92.0 01/30/2022   CHOLHDL 3 01/30/2022    Patient Active Problem List   Diagnosis Date Noted   History of breast cancer 12/13/2020   Vitamin D deficiency 09/19/2020   Mitral prolapse 02/11/2019   Abnormal EKG 02/10/2019   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   Family history of heart disease 07/15/2017   Prediabetes 07/15/2017   Anxiety 07/15/2017   Preoperative cardiovascular examination 04/07/2014   Colon cancer screening 03/09/2013   Routine general medical examination at a health care facility 01/30/2012   HYPERTENSION, BENIGN ESSENTIAL  02/01/2010   Osteoporosis 11/23/2008   Former smoker 10/21/2007   Hyperlipidemia 09/29/2007   MITRAL VALVE PROLAPSE 09/29/2007   FIBROCYSTIC BREAST DISEASE 09/29/2007   ALLERGY 09/29/2007   Past Medical History:  Diagnosis Date   Anxiety    Breast cancer ([place]) 1996   Right breast, Dr Faylene Million, DCIS, lumpectomy only   Cancer ([place])    skin   DCIS (ductal carcinoma in situ) 03/02/2019   Left, 11 mm high-grade DCIS, 10 mm margins. ER positive: 51-90%   Depression    Heart murmur    in her 30's and it is not a problem now   Hyperlipidemia    Hyperlipidemia    Hypertension    Osteopenia    Personal history of radiation therapy    Shortness of breath dyspnea    due to excercise only   Past Surgical History:  Procedure Laterality Date   BREAST BIOPSY Left 01/27/2019   Affirm Bx- Coil Clip- HIGH-GRADE DUCTAL CARCINOMA IN SITU, WITH COMEDONECROSIS.   BREAST EXCISIONAL BIOPSY Right 01/2002   neg. no lymph node involvement   BREAST EXCISIONAL BIOPSY Left 03/02/2019   lumpectomy DCIS   BREAST LUMPECTOMY Left 03/02/2019   left lumpectomy dcis with NL   BREAST LUMPECTOMY Left 03/02/2019   Procedure: BREAST WIDE EXCISION LEFT, WITH WIRE LOCATION;  Surgeon: Robert Bellow, MD;  Location: [facility] ORS;  Service: General;  Laterality: Left;   BREAST SURGERY  1998   breast biopsy DCIS   CATARACT EXTRACTION W/PHACO Right 05/01/2016   Procedure: CATARACT EXTRACTION PHACO AND INTRAOCULAR LENS PLACEMENT ([place]);  Surgeon: Birder Robson, MD;  Location: [facility];  Service: Ophthalmology;  Laterality: Right;  [place] 00:53 AP% 21.7 CDE 11.50 fluid pack lot # [id number]   CATARACT EXTRACTION W/PHACO Left 06/05/2016   Procedure: CATARACT EXTRACTION PHACO AND INTRAOCULAR LENS PLACEMENT ([facility]);  Surgeon: Birder Robson, MD;  Location: [facility];  Service: Ophthalmology;  Laterality: Left;  [place] 01:04 AP% 22.2 CDE 14.34 fluid pack lot # [id number]   COLONOSCOPY WITH PROPOFOL N/A 11/21/2015   Procedure:  COLONOSCOPY WITH PROPOFOL;  Surgeon: Hulen Luster, MD;  Location: [facility] END[facility]COPY;  Service: Gastroenterology;  Laterality: N/A;   SKIN BIOPSY  01/01/2023   Upper Back   Social History   Tobacco Use   Smoking status: Former    Packs/day: 0.50    Years: 45.00    Total pack years: 22.50    Types: Cigarettes, E-cigarettes    Quit date: 01/22/2016    Years since quitting: 7.0   Smokeless tobacco: Never   Tobacco comments:    also uses e-cigaretts  on occasion  Vaping Use   Vaping Use: Some days   Substances: Nicotine, Flavoring  Substance Use Topics   Alcohol use: No    Alcohol/week: 0.0 standard drinks of alcohol   Drug use: No   Family History  Problem Relation Age of Onset   Ovarian cancer Mother    Heart disease Sister    Lung cancer Sister    Lymphoma Father    Heart disease Father        MI   Hypertension Father    Alcohol abuse Brother    Lung cancer Brother    Breast cancer Sister 87   Heart disease Sister    Heart attack Sister    Breast cancer Daughter 51       Theophilus Kinds, LCIS   Breast cancer Paternal Aunt 86   Colon cancer Neg Hx    Allergies  Allergen Reactions   Alendronate Sodium     GI pain   Atorvastatin     Muscle pain   Crestor [Rosuvastatin Calcium]     Muscle pain   Evista [Raloxifene] Other (See Comments)    Breast tenderness   Rosuvastatin Other (See Comments)   Sertraline Hcl     Did not help, made anxiety worse   Pseudoephedrine Palpitations   Current Outpatient Medications on File Prior to Visit  Medication Sig Dispense Refill   acetaminophen (TYLENOL) 500 MG tablet Take 500 mg by mouth every 6 (six) hours as needed (for pain.).     Ascorbic Acid (VITAMIN C WITH ROSE HIPS) 1000 MG tablet Take 1,000 mg by mouth daily.     Calcium Carb-Cholecalciferol (CALCIUM 600 + D PO) Take 1 capsule by mouth daily.     Calcium-Magnesium-Zinc (CAL-MAG-ZINC PO) Take 1 tablet by mouth daily.     Cholecalciferol (VITAMIN D) 50 MCG (2000 UT) tablet Take  2,000 Units by mouth daily.     escitalopram (LEXAPRO) 20 MG tablet TAKE 1 TABLET BY MOUTH EVERY DAY 90 tablet 0   fluticasone (FLONASE) 50 MCG/ACT nasal spray PLACE 2 SPRAYS INTO BOTH NOSTRILS DAILY AS NEEDED FOR ALLERGIES. 48 mL 3   hydrochlorothiazide (HYDRODIURIL) 25 MG tablet TAKE 1 TABLET BY MOUTH EVERY DAY 90 tablet 0   Omega-3 Fatty Acids (FISH OIL) 1000 MG CAPS Take 1,000 mg by mouth daily.     vitamin E 1000 UNIT capsule Take 1 capsule by mouth daily.     No current facility-administered medications on file prior to visit.     Review of Systems  Constitutional:  Negative for activity change, appetite change, fatigue, fever and unexpected weight change.  HENT:  Positive for congestion. Negative for ear pain, rhinorrhea, sinus pressure and sore throat.        Recent head cold getting better  Eyes:  Negative for pain, redness and visual disturbance.  Respiratory:  Positive for cough. Negative for shortness of breath and wheezing.   Cardiovascular:  Negative for chest pain and palpitations.  Gastrointestinal:  Negative for abdominal pain, blood in stool, constipation and diarrhea.  Endocrine: Negative for polydipsia and polyuria.  Genitourinary:  Negative for dysuria, frequency and urgency.  Musculoskeletal:  Negative for arthralgias, back pain and myalgias.  Skin:  Negative for pallor and rash.  Allergic/Immunologic: Negative for environmental allergies.  Neurological:  Negative for dizziness, syncope and headaches.  Hematological:  Negative for adenopathy. Does not bruise/bleed easily.  Psychiatric/Behavioral:  Negative for decreased concentration and dysphoric mood. The patient is not nervous/anxious.  Objective:   Physical Exam Constitutional:      General: She is not in acute distress.    Appearance: Normal appearance. She is well-developed. She is obese. She is not ill-appearing or diaphoretic.  HENT:     Head: Normocephalic and atraumatic.     Right Ear:  Tympanic membrane, ear canal and external ear normal.     Left Ear: Tympanic membrane, ear canal and external ear normal.     Nose: Nose normal. No congestion.     Mouth/Throat:     Mouth: Mucous membranes are moist.     Pharynx: Oropharynx is clear. No posterior oropharyngeal erythema.  Eyes:     General: No scleral icterus.    Extraocular Movements: Extraocular movements intact.     Conjunctiva/sclera: Conjunctivae normal.     Pupils: Pupils are equal, round, and reactive to light.  Neck:     Thyroid: No thyromegaly.     Vascular: No carotid bruit or JVD.  Cardiovascular:     Rate and Rhythm: Normal rate and regular rhythm.     Pulses: Normal pulses.     Heart sounds: Normal heart sounds.     No gallop.  Pulmonary:     Effort: Pulmonary effort is normal. No respiratory distress.     Breath sounds: Normal breath sounds. No wheezing.     Comments: Good air exch Chest:     Chest wall: No tenderness.  Abdominal:     General: Bowel sounds are normal. There is no distension or abdominal bruit.     Palpations: Abdomen is soft. There is no mass.     Tenderness: There is no abdominal tenderness.     Hernia: No hernia is present.  Genitourinary:    Comments: Breast exam done recently by rad onc specialist .     Musculoskeletal:        General: No tenderness. Normal range of motion.     Cervical back: Normal range of motion and neck supple. No rigidity. No muscular tenderness.     Right lower leg: No edema.     Left lower leg: No edema.     Comments: No kyphosis   Lymphadenopathy:     Cervical: No cervical adenopathy.  Skin:    General: Skin is warm and dry.     Coloration: Skin is not pale.     Findings: No erythema or rash.     Comments: Solar lentigines diffusely Some sks  Neurological:     Mental Status: She is alert. Mental status is at baseline.     Cranial Nerves: No cranial nerve deficit.     Motor: No abnormal muscle tone.     Coordination: Coordination normal.      Gait: Gait normal.     Deep Tendon Reflexes: Reflexes are normal and symmetric. Reflexes normal.  Psychiatric:        Mood and Affect: Mood normal.        Cognition and Memory: Cognition and memory normal.           Assessment & Plan:   Problem List Items Addressed This Visit       Cardiovascular and Mediastinum   HYPERTENSION, BENIGN ESSENTIAL    bp in fair control at this time  BP Readings from Last 1 Encounters:  02/20/23 132/80  No changes needed Most recent labs reviewed  Disc lifstyle change with low sodium diet and exercise  Plan to continue hctz 25 mg daily  Labs ordered  Relevant Orders   TSH   Lipid panel   Comprehensive metabolic panel   CBC with Differential/Platelet     Musculoskeletal and Integument   Osteoporosis    Dexa 06/2021  Due this summer Declines tx No falls or fx  Takes ca and D Enc to add some strength training Disc fall prev        Other   Anxiety    PHQ score of 0 Doing well with lexapro 20 mg daily  Reviewed stressors/ coping techniques/symptoms/ support sources/ tx options and side effects in detail today       Colon cancer screening    Colonoscopy utd 2016      History of breast cancer    Continues f/u for this  Mammogram utd       Hyperlipidemia    Disc goals for lipids and reasons to control them Rev last labs with pt Rev low sat fat diet in detail  New panel ordered  Intolerant of any dose statin  Intol of zetia  ? If would be candidate for pcyk9 in future Last LDL 164 Pending updated lab today      Relevant Orders   Lipid panel   Prediabetes    A1c  ordered disc imp of low glycemic diet and wt loss to prevent DM2       Relevant Orders   Hemoglobin A1c   Routine general medical examination at a health care facility - Primary    Reviewed health habits including diet and exercise and skin cancer prevention Reviewed appropriate screening tests for age  Also reviewed health mt list, fam hx and  immunization status , as well as social and family history   See HPI Labs ordered  Mammogram utd-pending report from last week  Has rad onc f/u and breast exam yearly/ recent  Utd derm care  Dexa due in July, no falls or fx and takes D  Enc strength training  Colonoscopy utd 2016 PHQ 0      Vitamin D deficiency    In setting of OP Level ordered  Takes both ca and D      Relevant Orders   VITAMIN D 25 Hydroxy (Vit-D Deficiency, Fractures)

## 2023-02-20 NOTE — Patient Instructions (Addendum)
Call us mid summer to put dexa order in   If you can- add some strength training  Bands or weights or machines   Chair yoga   Labs today    Take care of yourself

## 2023-02-20 NOTE — Assessment & Plan Note (Signed)
Disc goals for lipids and reasons to control them Rev last labs with pt Rev low sat fat diet in detail  New panel ordered  Intolerant of any dose statin  Intol of zetia  ? If would be candidate for pcyk9 in future Last LDL 164 Pending updated lab today

## 2023-02-20 NOTE — Assessment & Plan Note (Signed)
In setting of OP Level ordered  Takes both ca and D

## 2023-02-20 NOTE — Assessment & Plan Note (Signed)
Dexa 06/2021  Due this summer Declines tx No falls or fx  Takes ca and D Enc to add some strength training Disc fall prev

## 2023-02-20 NOTE — Assessment & Plan Note (Signed)
Continues f/u for this  Mammogram utd

## 2023-02-20 NOTE — Assessment & Plan Note (Signed)
bp in fair control at this time  BP Readings from Last 1 Encounters:  02/20/23 132/80   No changes needed Most recent labs reviewed  Disc lifstyle change with low sodium diet and exercise  Plan to continue hctz 25 mg daily  Labs ordered

## 2023-02-20 NOTE — Assessment & Plan Note (Signed)
A1c ordered disc imp of low glycemic diet and wt loss to prevent DM2  

## 2023-02-20 NOTE — Assessment & Plan Note (Signed)
PHQ score of 0 Doing well with lexapro 20 mg daily  Reviewed stressors/ coping techniques/symptoms/ support sources/ tx options and side effects in detail today

## 2023-02-20 NOTE — Assessment & Plan Note (Signed)
Colonoscopy utd 2016

## 2023-02-28 DIAGNOSIS — D0461 Carcinoma in situ of skin of right upper limb, including shoulder: Secondary | ICD-10-CM | POA: Diagnosis not present

## 2023-05-15 ENCOUNTER — Other Ambulatory Visit: Payer: Self-pay | Admitting: Family Medicine

## 2023-06-04 ENCOUNTER — Ambulatory Visit (INDEPENDENT_AMBULATORY_CARE_PROVIDER_SITE_OTHER): Payer: PPO | Admitting: Surgery

## 2023-06-04 ENCOUNTER — Other Ambulatory Visit: Payer: Self-pay | Admitting: Surgery

## 2023-06-04 ENCOUNTER — Encounter: Payer: Self-pay | Admitting: Surgery

## 2023-06-04 VITALS — BP 124/83 | HR 88 | Temp 98.0°F | Ht 65.5 in | Wt 174.0 lb

## 2023-06-04 DIAGNOSIS — R221 Localized swelling, mass and lump, neck: Secondary | ICD-10-CM

## 2023-06-04 NOTE — Progress Notes (Signed)
Patient ID: Erin Hinton, female   DOB: [birth year], 77 y.o.   MRN: [id number]  Chief Complaint: Right posterior neck mass  History of Present Illness Erin Hinton is a 77 y.o. female with right posterior neck mass that has been present for years.  She reports no change in size, but it has become increasingly tender over the last 3 weeks, has been some radiating soreness down both arms to the elbows for about 2 weeks.  Denies fevers or chills, denies drainage.  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 ER positive: 51-90%   Depression    Heart murmur    in her 30's and it is not a problem now   Hyperlipidemia    Hyperlipidemia    Hypertension    Osteopenia    Personal history of radiation therapy    Shortness of breath dyspnea    due to excercise only      Past Surgical History:  Procedure Laterality Date   BREAST BIOPSY Left 01/27/2019   Affirm Bx- Coil Clip- HIGH-GRADE DUCTAL CARCINOMA IN SITU, WITH COMEDONECROSIS.   BREAST EXCISIONAL BIOPSY Right 01/2002   neg. no lymph node involvement   BREAST EXCISIONAL BIOPSY Left 03/02/2019   lumpectomy DCIS   BREAST LUMPECTOMY Left 03/02/2019   left lumpectomy dcis with NL   BREAST LUMPECTOMY Left 03/02/2019   Procedure: BREAST WIDE EXCISION LEFT, WITH WIRE LOCATION;  Surgeon: Earline Mayotte, MD;  Location: [facility];  Service: General;  Laterality: Left;   BREAST SURGERY  1998   breast biopsy DCIS   CATARACT EXTRACTION W/PHACO Right 05/01/2016   Procedure: CATARACT EXTRACTION PHACO AND INTRAOCULAR LENS PLACEMENT (IOC);  Surgeon: Galen Manila, MD;  Location: [facility];  Service: Ophthalmology;  Laterality: Right;  [place] 00:53 AP% 21.7 CDE 11.50 fluid pack lot # [id number]   CATARACT EXTRACTION W/PHACO  Left 06/05/2016   Procedure: CATARACT EXTRACTION PHACO AND INTRAOCULAR LENS PLACEMENT (IOC);  Surgeon: Galen Manila, MD;  Location: [facility];  Service: Ophthalmology;  Laterality: Left;  [place] 01:04 AP% 22.2 CDE 14.34 fluid pack lot # [id number]   COLONOSCOPY WITH PROPOFOL N/A 11/21/2015   Procedure: COLONOSCOPY WITH PROPOFOL;  Surgeon: Wallace Cullens, MD;  Location: [facility];  Service: Gastroenterology;  Laterality: N/A;   SKIN BIOPSY  01/01/2023   Upper Back    Allergies  Allergen Reactions   Alendronate Sodium     GI pain   Atorvastatin     Muscle pain   Crestor [Rosuvastatin Calcium]     Muscle pain   Evista [Raloxifene] Other (See Comments)    Breast tenderness   Rosuvastatin Other (See Comments)   Sertraline Hcl     Did not help, made anxiety worse   Pseudoephedrine Palpitations    Current Outpatient Medications  Medication Sig Dispense Refill   acetaminophen (TYLENOL) 500 MG tablet Take 500 mg by mouth every 6 (six) hours as needed (for pain.).     Ascorbic Acid (VITAMIN C WITH ROSE HIPS) 1000 MG tablet Take 1,000 mg by mouth daily.     Calcium Carb-Cholecalciferol (CALCIUM 600 + D PO) Take 1 capsule by mouth daily.     Calcium-Magnesium-Zinc (CAL-MAG-ZINC PO) Take 1 tablet by mouth daily.  Cholecalciferol (VITAMIN D) 50 MCG (2000 UT) tablet Take 2,000 Units by mouth daily.     escitalopram (LEXAPRO) 20 MG tablet TAKE 1 TABLET BY MOUTH EVERY DAY 90 tablet 2   fluticasone (FLONASE) 50 MCG/ACT nasal spray PLACE 2 SPRAYS INTO BOTH NOSTRILS DAILY AS NEEDED FOR ALLERGIES. 48 mL 3   hydrochlorothiazide (HYDRODIURIL) 25 MG tablet TAKE 1 TABLET BY MOUTH EVERY DAY 90 tablet 2   Omega-3 Fatty Acids (FISH OIL) 1000 MG CAPS Take 1,000 mg by mouth daily.     vitamin E 1000 UNIT capsule Take 1 capsule by mouth daily.     No current facility-administered medications for this visit.    Family History Family History  Problem Relation Age of Onset   Ovarian cancer Mother     Heart disease Sister    Lung cancer Sister    Lymphoma Father    Heart disease Father        MI   Hypertension Father    Alcohol abuse Brother    Lung cancer Brother    Breast cancer Sister 29   Heart disease Sister    Heart attack Sister    Breast cancer Daughter 28       Loleta Dicker, LCIS   Breast cancer Paternal Aunt 55   Colon cancer Neg Hx       Social History Social History   Tobacco Use   Smoking status: Former    Packs/day: 0.50    Years: 45.00    Additional pack years: 0.00    Total pack years: 22.50    Types: Cigarettes, E-cigarettes    Quit date: 01/22/2016    Years since quitting: 7.3    Passive exposure: Past   Smokeless tobacco: Never   Tobacco comments:    also uses e-cigaretts on occasion  Vaping Use   Vaping Use: Some days   Substances: Nicotine, Flavoring  Substance Use Topics   Alcohol use: No    Alcohol/week: 0.0 standard drinks of alcohol   Drug use: No        Review of Systems  Constitutional: Negative.   HENT: Negative.    Eyes: Negative.   Respiratory: Negative.    Cardiovascular: Negative.   Gastrointestinal: Negative.   Genitourinary: Negative.   Skin: Negative.   Neurological: Negative.   Psychiatric/Behavioral: Negative.       Physical Exam Blood pressure 124/83, pulse 88, temperature 98 F (36.7 C), height 5' 5.5" (1.664 m), weight 174 lb (78.9 kg), SpO2 97 %. Last Weight  Most recent update: 06/04/2023  3:00 PM    Weight  78.9 kg (174 lb)             CONSTITUTIONAL: Well developed, and nourished, appropriately responsive and aware without distress.   EYES: Sclera non-icteric.   EARS, NOSE, MOUTH AND THROAT:  The oropharynx is clear. Oral mucosa is pink and moist.    Hearing is intact to voice.  NECK: Trachea is midline, and there is no jugular venous distension.   Posterior right neck there is a 3 cm, discrete mass, that is immobile, somewhat tethered to the deep soft tissues.  There is no overlying dermal changes,  skin punctum.  There is no fluctuance, no softness to consider consistent with lipoma. LYMPH NODES:  Lymph nodes in the neck are not appreciated. RESPIRATORY:  Lungs are clear, and breath sounds are equal bilaterally.  Normal respiratory effort without pathologic use of accessory muscles. CARDIOVASCULAR: Heart is regular in rate and rhythm.  Well perfused.  GI: The abdomen is  soft, nontender, and nondistended.  MUSCULOSKELETAL:  Symmetrical muscle tone appreciated in all four extremities.    SKIN: Skin turgor is normal. No pathologic skin lesions appreciated.  NEUROLOGIC:  Motor and sensation appear grossly normal.  Cranial nerves are grossly without defect. PSYCH:  Alert and oriented to person, place and time. Affect is appropriate for situation.  Data Reviewed I have personally reviewed what is currently available of the patient's imaging, recent labs and medical records.   Labs:     Latest Ref Rng & Units 02/20/2023   11:06 AM 01/30/2022   11:24 AM 09/09/2020    8:29 AM  CBC  WBC 4.0 - 10.5 K/uL 5.4  5.1  3.7   Hemoglobin 12.0 - 15.0 g/dL 16.1  09.6  04.5   Hematocrit 36.0 - 46.0 % 40.3  40.0  39.2   Platelets 150.0 - 400.0 K/uL 235.0  223.0  203.0       Latest Ref Rng & Units 02/20/2023   11:06 AM 01/30/2022   11:24 AM 09/09/2020    8:29 AM  CMP  Glucose 70 - 99 mg/dL 84  98  94   BUN 6 - 23 mg/dL 15  15  21    Creatinine 0.40 - 1.20 mg/dL 4.09  8.11  9.14   Sodium 135 - 145 mEq/L 140  140  142   Potassium 3.5 - 5.1 mEq/L 4.3  4.1  4.1   Chloride 96 - 112 mEq/L 101  101  104   CO2 19 - 32 mEq/L 31  32  33   Calcium 8.4 - 10.5 mg/dL 78.2  95.6  9.4   Total Protein 6.0 - 8.3 g/dL 6.4  7.6  6.3   Total Bilirubin 0.2 - 1.2 mg/dL 0.5  0.5  0.6   Alkaline Phos 39 - 117 U/L 57  55  54   AST 0 - 37 U/L 19  16  14    ALT 0 - 35 U/L 14  8  9      Imaging: Radiological images reviewed:   Within last 24 hrs: No results found.  Assessment    Right posterior neck mass, 3 cm. Patient  Active Problem List   Diagnosis Date Noted   History of breast cancer 12/13/2020   Vitamin D deficiency 09/19/2020   Mitral prolapse 02/11/2019   Abnormal EKG 02/10/2019   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   Family history of heart disease 07/15/2017   Prediabetes 07/15/2017   Anxiety 07/15/2017   Preoperative cardiovascular examination 04/07/2014   Colon cancer screening 03/09/2013   Routine general medical examination at a health care facility 01/30/2012   HYPERTENSION, BENIGN ESSENTIAL 02/01/2010   Osteoporosis 11/23/2008   Former smoker 10/21/2007   Hyperlipidemia 09/29/2007   MITRAL VALVE PROLAPSE 09/29/2007   FIBROCYSTIC BREAST DISEASE 09/29/2007   ALLERGY 09/29/2007    Plan    Excision of 3.2 cm right posterior deep/subfascial soft tissue mass.  Pre-operative Diagnosis: Soft tissue mass right posterior neck  Post-operative Diagnosis: same.    Surgeon: Campbell Lerner, M.D., FACS  Anesthesia: Local   Findings: Solid tissue mass, well-defined capsule, final pathology pending  Estimated Blood Loss: 2 mL         Specimens: As described above.  Left I did send back in again 3 1 at home.           Complications: none  Procedure Details  The patient was evaluated, the benefits, complications, treatment options, and expected outcomes were discussed with the patient. The risks of bleeding, infection, recurrence of symptoms, failure to resolve symptoms, unanticipated injury, any of which could require further surgery were reviewed with the patient. The likelihood of improving the patient's symptoms with return to their baseline status is expected.  The patient and/or family concurred with the proposed plan, giving informed consent.  The patient was taken to our procedure room, identified and the procedure verified.    The patient was positioned in the prone position and the right posterior neck was prepped with  Chloraprep and draped in  the sterile fashion.  A Time Out was held and the above information confirmed.  Local infiltration with 1% lidocaine with epinephrine is applied until adequate numbing was achieved.  A transverse incision was made directly over the mass surrounding the soft tissues were sharply incised to approach the mass itself.  The mass was dissected away from adjacent soft tissues and off of the underlying deep fascial tissues.  Hemostasis obtained with combination of hemostats and pressure.  The incision was closed with interrupted 4-0 Monocryl subcuticular's.  And sealed with Dermabond.  Patient Toller procedure well  Face-to-face time spent with the patient and accompanying care providers(if present) was 45 minutes, with more than 50% of the time spent counseling, educating, and coordinating care of the patient.    These notes generated with voice recognition software. I apologize for typographical errors.  Campbell Lerner M.D., FACS 06/04/2023, 3:08 PM

## 2023-06-04 NOTE — Patient Instructions (Addendum)
We have removed a Cyst in our office today. Your incision was closed with Dermabond.  It is best to keep it clean and dry, it will tolerate a brief shower, but do not soak it or apply any creams or lotions to the incisions.  The Dermabond should gradually flake off over time.  Keep it open to air so you can evaluate your incisions.  Dermabond assists the underlying sutures to keep your incision closed and protected from infection.  Should you develop some drainage from your incision, some drops of drainage would be okay but if it persists continue to put keep a dry dressing over it.   You have sutures under the skin that will dissolve and also dermabond (skin glue) on top of your skin which will come off on it's own in 10-14 days.  You may use Ibuprofen or Tylenol as needed for pain control. Use the ice pack 3-4 times a day for the next two days for any achiness.  You may shower tomorrow, do not scrub at the area.  Avoid Strenuous activities that will make you sweat during the next 48 hours to avoid the glue coming off prematurely. Avoid activities that will place pressure to this area of the body for 1-2 weeks to avoid re-injury to incision site.  Please see your follow-up appointment provided. We will see you back in office to make sure this area is healed. If you have any questions or concerns prior to this appointment, call our office and speak with a nurse.    Excision of Skin Cysts or Lesions Excision of a skin lesion refers to the removal of a section of skin by making small cuts (incisions) in the skin. This procedure may be done to remove a cancerous (malignant) or noncancerous (benign) growth on the skin. It is typically done to treat or prevent cancer or infection. It may also be done to improve cosmetic appearance. The procedure may be done to remove: Cancerous growths, such as basal cell carcinoma, squamous cell carcinoma, or melanoma. Noncancerous growths, such as a cyst or  lipoma. Growths, such as moles or skin tags, which may be removed for cosmetic reasons.  Various excision or surgical techniques may be used depending on your condition, the location of the lesion, and your overall health. Tell a health care provider about: Any allergies you have. All medicines you are taking, including vitamins, herbs, eye drops, creams, and over-the-counter medicines. Any problems you or family members have had with anesthetic medicines. Any blood disorders you have. Any surgeries you have had. Any medical conditions you have. Whether you are pregnant or may be pregnant. What are the risks? Generally, this is a safe procedure. However, problems may occur, including: Bleeding. Infection. Scarring. Recurrence of the cyst, lipoma, or cancer. Changes in skin sensation or appearance, such as discoloration or swelling. Reaction to the anesthetics. Allergic reaction to surgical materials or ointments. Damage to nerves, blood vessels, muscles, or other structures. Continued pain.  What happens before the procedure? Ask your health care provider about: Changing or stopping your regular medicines. This is especially important if you are taking diabetes medicines or blood thinners. Taking medicines such as aspirin and ibuprofen. These medicines can thin your blood. Do not take these medicines before your procedure if your health care provider instructs you not to. You may be asked to take certain medicines. You may be asked to stop smoking. You may have an exam or testing. What happens during the procedure? To reduce  your risk of infection: Your health care team will wash or sanitize their hands. Your skin will be washed with soap. You will be given a medicine to numb the area (local anesthetic). One of the following excision techniques will be performed. At the end of any of these procedures, antibiotic ointment will be applied as needed. Each of the following  techniques may vary among health care providers and hospitals. Complete Surgical Excision The area of skin that needs to be removed will be marked with a pen. Using a small scalpel or scissors, the surgeon will gently cut around and under the lesion until it is completely removed. The lesion will be placed in a fluid and sent to the lab for examination. If necessary, bleeding will be controlled with a device that delivers heat (electrocautery). The edges of the wound may be stitched (sutured) together, and a bandage (dressing) will be applied. This procedure may be performed to treat a cancerous growth or a noncancerous cyst or lesion. Excision of a Cyst The surgeon will make an incision on the cyst. The entire cyst will be removed through the incision. The incision may be closed with sutures.  What happens after the procedure? Return to your normal activities as told by your health care provider.

## 2023-06-13 ENCOUNTER — Encounter: Payer: Self-pay | Admitting: Surgery

## 2023-06-13 ENCOUNTER — Ambulatory Visit (INDEPENDENT_AMBULATORY_CARE_PROVIDER_SITE_OTHER): Payer: PPO | Admitting: Surgery

## 2023-06-13 VITALS — BP 150/80 | HR 70 | Temp 98.0°F | Ht 65.5 in | Wt 176.0 lb

## 2023-06-13 DIAGNOSIS — Z09 Encounter for follow-up examination after completed treatment for conditions other than malignant neoplasm: Secondary | ICD-10-CM

## 2023-06-13 DIAGNOSIS — R221 Localized swelling, mass and lump, neck: Secondary | ICD-10-CM

## 2023-06-13 NOTE — Progress Notes (Signed)
[facility] POST-OP OFFICE VISIT  06/13/2023  HPI: Erin Hinton is a 77 y.o. female 9 days s/p excision of right neck lesion.  Pathology noted below.  She reports significant improvement of the pain that was in both arms, and even turning her neck is significantly better.  We are delighted to receive these unanticipated improvements.  Pathology appears to be benign, for potential for recurrence uncertain.  Vital signs: BP (!) 150/80   Pulse 70   Temp 98 F (36.7 C)   Ht 5' 5.5" (1.664 m)   Wt 176 lb (79.8 kg)   SpO2 97%   BMI 28.84 kg/m    Physical Exam: Constitutional: She appears well Skin: Incision is clean dry and intact, there is no evidence of induration, hematoma or seroma.  Dermabond is still intact and no evidence of drainage.  Assessment/Plan: This is a 77 y.o. female 9 days s/p excision of lesion from right neck.  Pathology below.   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 All features indicate a low-grade lesion. The differential diagnosis includes fibromatosis versus a nuchal fibroma. Immunohistochemical staining for Beta-catenin will be sent out and reported as an addendum. Clinical correlation recommended. Leonarda Salon M.D. Pathologist, Electronic Signature  Patient Active Problem List   Diagnosis Date Noted   Neck mass 06/04/2023   History of breast cancer 12/13/2020   Vitamin D deficiency 09/19/2020   Mitral prolapse 02/11/2019   Abnormal EKG 02/10/2019   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   Family history of heart disease 07/15/2017   Prediabetes 07/15/2017   Anxiety 07/15/2017   Preoperative cardiovascular examination 04/07/2014   Colon cancer screening 03/09/2013   Routine general medical examination at a health care  facility 01/30/2012   HYPERTENSION, BENIGN ESSENTIAL 02/01/2010   Osteoporosis 11/23/2008   Former smoker 10/21/2007   Hyperlipidemia 09/29/2007   MITRAL VALVE PROLAPSE 09/29/2007   FIBROCYSTIC BREAST DISEASE 09/29/2007   ALLERGY 09/29/2007    -Would anticipate follow-up as needed at this point.  Will reach out to her should there be any updated or changes in prognosis based on additional testing.   Campbell Lerner M.D., FACS 06/13/2023, 11:23 AM

## 2023-06-13 NOTE — Patient Instructions (Signed)
Follow-up with our office as needed.  Please call and ask to speak with a nurse if you develop questions or concerns.   We will call you if any further information comes of the follow up pathology.

## 2023-07-16 DIAGNOSIS — Z85820 Personal history of malignant melanoma of skin: Secondary | ICD-10-CM | POA: Diagnosis not present

## 2023-07-16 DIAGNOSIS — L570 Actinic keratosis: Secondary | ICD-10-CM | POA: Diagnosis not present

## 2023-07-16 DIAGNOSIS — D2271 Melanocytic nevi of right lower limb, including hip: Secondary | ICD-10-CM | POA: Diagnosis not present

## 2023-07-16 DIAGNOSIS — D225 Melanocytic nevi of trunk: Secondary | ICD-10-CM | POA: Diagnosis not present

## 2023-07-16 DIAGNOSIS — D2262 Melanocytic nevi of left upper limb, including shoulder: Secondary | ICD-10-CM | POA: Diagnosis not present

## 2023-07-16 DIAGNOSIS — D485 Neoplasm of uncertain behavior of skin: Secondary | ICD-10-CM | POA: Diagnosis not present

## 2023-07-16 DIAGNOSIS — Z85828 Personal history of other malignant neoplasm of skin: Secondary | ICD-10-CM | POA: Diagnosis not present

## 2023-07-16 DIAGNOSIS — D2261 Melanocytic nevi of right upper limb, including shoulder: Secondary | ICD-10-CM | POA: Diagnosis not present

## 2023-08-20 ENCOUNTER — Other Ambulatory Visit: Payer: Self-pay | Admitting: Family Medicine

## 2023-08-20 DIAGNOSIS — Z1231 Encounter for screening mammogram for malignant neoplasm of breast: Secondary | ICD-10-CM

## 2023-11-25 ENCOUNTER — Ambulatory Visit: Payer: PPO | Admitting: Radiation Oncology

## 2024-01-06 ENCOUNTER — Ambulatory Visit: Payer: PPO | Admitting: Radiation Oncology

## 2024-01-13 ENCOUNTER — Ambulatory Visit
Admission: RE | Admit: 2024-01-13 | Discharge: 2024-01-13 | Disposition: A | Discharge status: Home / Self Care | Payer: PPO | Source: Ambulatory Visit | Attending: Emergency Medicine | Admitting: Emergency Medicine

## 2024-01-13 DIAGNOSIS — J069 Acute upper respiratory infection, unspecified: Secondary | ICD-10-CM | POA: Diagnosis not present

## 2024-01-13 DIAGNOSIS — R0602 Shortness of breath: Secondary | ICD-10-CM

## 2024-01-13 MED ORDER — AMOXICILLIN-POT CLAVULANATE 875-125 MG PO TABS: 1.0000 | ORAL_TABLET | Freq: Two times a day (BID) | ORAL | 0 refills | Status: DC

## 2024-01-13 MED ORDER — ALBUTEROL SULFATE HFA 108 (90 BASE) MCG/ACT IN AERS: 2.0000 | INHALATION_SPRAY | RESPIRATORY_TRACT | 0 refills | Status: DC | PRN

## 2024-01-13 NOTE — ED Provider Notes (Signed)
[name]    CSN: [id number] Arrival date & time: 01/13/24  1138      History   Chief Complaint Chief Complaint  Patient presents with   Wheezing    Upper respiratory ..breathing..shortness of breath - Entered by patient    HPI Erin Hinton is a 78 y.o. female.   Patient presents for evaluation of chills, nasal congestion, rhinorrhea, nonproductive cough, shortness of breath with exertion, wheezing present for 2 weeks.  Shortness of breath exacerbated by walking and having long conversations.  Feels as if there is mucus constantly sitting in the throat she is unable to expel.  Has been able to tolerate food and liquids.  No known sick contacts prior.  Has attempted use of Mucinex which has been ineffective.  Occasional vaping.  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   Family history of heart disease 07/15/2017   Prediabetes 07/15/2017   Anxiety 07/15/2017   Preoperative cardiovascular examination 04/07/2014   Colon cancer screening 03/09/2013   Routine general medical examination at a health care facility 01/30/2012   HYPERTENSION,  BENIGN ESSENTIAL 02/01/2010   Osteoporosis 11/23/2008   Former smoker 10/21/2007   Hyperlipidemia 09/29/2007   Mitral valve disease 09/29/2007   FIBROCYSTIC BREAST DISEASE 09/29/2007   Allergy 09/29/2007    Past Surgical History:  Procedure Laterality Date   BREAST BIOPSY Left 01/27/2019   Affirm Bx- Coil Clip- HIGH-GRADE DUCTAL CARCINOMA IN SITU, WITH COMEDONECROSIS.   BREAST EXCISIONAL BIOPSY Right 01/2002   neg. no lymph node involvement   BREAST EXCISIONAL BIOPSY Left 03/02/2019   lumpectomy DCIS   BREAST LUMPECTOMY Left 03/02/2019   left lumpectomy dcis with NL   BREAST LUMPECTOMY Left 03/02/2019   Procedure: BREAST WIDE EXCISION LEFT, WITH WIRE LOCATION;  Surgeon: Earline Mayotte, MD;  Location: [facility];  Service: General;  Laterality: Left;   BREAST SURGERY  1998   breast biopsy DCIS   CATARACT EXTRACTION W/PHACO Right 05/01/2016   Procedure: CATARACT EXTRACTION PHACO AND INTRAOCULAR LENS PLACEMENT (IOC);  Surgeon: Galen Manila, MD;  Location: [facility];  Service: Ophthalmology;  Laterality: Right;  [place] 00:53 AP% 21.7 CDE 11.50 fluid pack lot # [id number]   CATARACT EXTRACTION W/PHACO Left 06/05/2016   Procedure: CATARACT EXTRACTION PHACO AND INTRAOCULAR LENS PLACEMENT (IOC);  Surgeon: Galen Manila, MD;  Location: [facility];  Service: Ophthalmology;  Laterality: Left;  [place] 01:04 AP% 22.2 CDE 14.34 fluid pack lot # [id number]   COLONOSCOPY WITH PROPOFOL N/A  11/21/2015   Procedure: COLONOSCOPY WITH PROPOFOL;  Surgeon: Wallace Cullens, MD;  Location: [facility];  Service: Gastroenterology;  Laterality: N/A;   SKIN BIOPSY  01/01/2023   Upper Back    OB History     Gravida  2   Para  2   Term      Preterm      AB      Living         SAB      IAB      Ectopic      Multiple      Live Births           Obstetric Comments  1st Menstrual Cycle: 14 1st Pregnancy: 27           Home Medications    Prior to Admission medications   Medication  Sig Start Date End Date Taking? Authorizing Provider  albuterol (VENTOLIN HFA) 108 (90 Base) MCG/ACT inhaler Inhale 2 puffs into the lungs every 4 (four) hours as needed for wheezing or shortness of breath. 01/13/24  Yes Tayon Parekh R, NP  amoxicillin-clavulanate (AUGMENTIN) 875-125 MG tablet Take 1 tablet by mouth every 12 (twelve) hours. 01/13/24  Yes Tresha Muzio, Elita Boone, NP  acetaminophen (TYLENOL) 500 MG tablet Take 500 mg by mouth every 6 (six) hours as needed (for pain.).    [provider]  Ascorbic Acid (VITAMIN C WITH ROSE HIPS) 1000 MG tablet Take 1,000 mg by mouth daily.    [provider]  Calcium Carb-Cholecalciferol (CALCIUM 600 + D PO) Take 1 capsule by mouth daily.    [provider]  Calcium-Magnesium-Zinc (CAL-MAG-ZINC PO) Take 1 tablet by mouth daily.    [provider]  Cholecalciferol (VITAMIN D) 50 MCG (2000 UT) tablet Take 2,000 Units by mouth daily.    [provider]  escitalopram (LEXAPRO) 20 MG tablet TAKE 1 TABLET BY MOUTH EVERY DAY 05/15/23   Tower, Audrie Gallus, MD  fluticasone (FLONASE) 50 MCG/ACT nasal spray PLACE 2 SPRAYS INTO BOTH NOSTRILS DAILY AS NEEDED FOR ALLERGIES. 03/23/22 05/30/25  Tower, Audrie Gallus, MD  hydrochlorothiazide (HYDRODIURIL) 25 MG tablet TAKE 1 TABLET BY MOUTH EVERY DAY 05/15/23   Tower, Audrie Gallus, MD  Omega-3 Fatty Acids (FISH OIL) 1000 MG CAPS Take 1,000 mg by mouth daily.    [provider]  vitamin E 1000 UNIT capsule Take 1 capsule by mouth daily.    [provider]    Family History Family History  Problem Relation Age of Onset   Ovarian cancer Mother    Heart disease Sister    Lung cancer Sister    Lymphoma Father    Heart disease Father        MI   Hypertension Father    Alcohol abuse Brother    Lung cancer Brother    Breast cancer Sister 71   Heart disease Sister    Heart attack Sister    Breast cancer Daughter 49       Loleta Dicker, LCIS   Breast cancer Paternal Aunt 8    Colon cancer Neg Hx     Social History Social History   Tobacco Use   Smoking status: Former    Current packs/day: 0.00    Average packs/day: 0.5 packs/day for 45.0 years (22.5 ttl pk-yrs)    Types: Cigarettes, E-cigarettes    Start date: 01/21/1971    Quit date: 01/22/2016    Years since quitting: 7.9    Passive exposure:  Past   Smokeless tobacco: Never   Tobacco comments:    also uses e-cigaretts on occasion  Vaping Use   Vaping status: Some Days   Substances: Nicotine, Flavoring  Substance Use Topics   Alcohol use: No    Alcohol/week: 0.0 standard drinks of alcohol   Drug use: No     Allergies   Alendronate sodium, Atorvastatin, Crestor [rosuvastatin calcium], Evista [raloxifene], Rosuvastatin, Sertraline hcl, and Pseudoephedrine   Review of Systems Review of Systems   Physical Exam Triage Vital Signs ED Triage Vitals  Encounter Vitals Group     BP      Systolic BP Percentile      Diastolic BP Percentile      Pulse      Resp      Temp      Temp src      SpO2      Weight      Height      Head Circumference      Peak Flow      Pain Score      Pain Loc      Pain Education      Exclude from Growth Chart    No data found.  Updated Vital Signs There were no vitals taken for this visit.  Visual Acuity Right Eye Distance:   Left Eye Distance:   Bilateral Distance:    Right Eye Near:   Left Eye Near:    Bilateral Near:     Physical Exam Constitutional:      Appearance: Normal appearance.  HENT:     Right Ear: Tympanic membrane, ear canal and external ear normal.     Left Ear: Tympanic membrane, ear canal and external ear normal.     Nose: Congestion present. No rhinorrhea.     Mouth/Throat:     Mouth: Mucous membranes are moist.     Pharynx: Oropharynx is clear. No oropharyngeal exudate or posterior oropharyngeal erythema.  Eyes:     Extraocular Movements: Extraocular movements intact.  Cardiovascular:     Rate and Rhythm: Normal rate and  regular rhythm.     Pulses: Normal pulses.     Heart sounds: Normal heart sounds.  Pulmonary:     Effort: Pulmonary effort is normal.     Breath sounds: Normal breath sounds.  Musculoskeletal:     Cervical back: Normal range of motion and neck supple.  Neurological:     Mental Status: She is alert and oriented to person, place, and time. Mental status is at baseline.      UC Treatments / Results  Labs (all labs ordered are listed, but only abnormal results are displayed) Labs Reviewed - No data to display  EKG   Radiology No results found.  Procedures Procedures (including critical care time)  Medications Ordered in UC Medications - No data to display  Initial Impression / Assessment and Plan / UC Course  I have reviewed the triage vital signs and the nursing notes.  Pertinent labs & imaging results that were available during my care of the patient were reviewed by me and considered in my medical decision making (see chart for details).  Acute URI, shortness of breath  Patient is in no signs of distress nor toxic appearing.  Vital signs are stable.  Low suspicion for pneumonia, pneumothorax or bronchitis and therefore will defer imaging.  Symptoms present for 2 weeks without signs of resolution now experiencing shortness of breath and wheezing, prescribed Augmentin as well  as albuterol inhaler, declined prescription for prednisone.May use additional over-the-counter medications as needed for supportive care.  May follow-up with urgent care as needed if symptoms persist or worsen.   Final Clinical Impressions(s) / UC Diagnoses   Final diagnoses:  Acute URI  SOB (shortness of breath)     Discharge Instructions      Your symptoms have been present for 2 weeks and therefore we will provide you with bacterial coverage as they are most likely causing symptoms to linger  Begin Augmentin every morning and every evening for 7 days  May take 2 puffs of albuterol inhaler  every 4-6 hours as needed for any shortness of breath    You can take Tylenol and/or Ibuprofen as needed for fever reduction and pain relief.   For cough: honey 1/2 to 1 teaspoon (you can dilute the honey in water or another fluid).  You can also use guaifenesin and dextromethorphan for cough. You can use a humidifier for chest congestion and cough.  If you don't have a humidifier, you can sit in the bathroom with the hot shower running.      For sore throat: try warm salt water gargles, cepacol lozenges, throat spray, warm tea or water with lemon/honey, popsicles or ice, or OTC cold relief medicine for throat discomfort.   For congestion: take a daily anti-histamine like Zyrtec, Claritin, and a oral decongestant, such as pseudoephedrine.  You can also use Flonase 1-2 sprays in each nostril daily.   It is important to stay hydrated: drink plenty of fluids (water, gatorade/powerade/pedialyte, juices, or teas) to keep your throat moisturized and help further relieve irritation/discomfort.    ED Prescriptions     Medication Sig Dispense Auth. Provider   amoxicillin-clavulanate (AUGMENTIN) 875-125 MG tablet Take 1 tablet by mouth every 12 (twelve) hours. 14 tablet Eloni Darius R, NP   albuterol (VENTOLIN HFA) 108 (90 Base) MCG/ACT inhaler Inhale 2 puffs into the lungs every 4 (four) hours as needed for wheezing or shortness of breath. 18 g Valinda Hoar, NP      PDMP not reviewed this encounter.   Valinda Hoar, NP 01/13/24 1247

## 2024-01-13 NOTE — ED Triage Notes (Signed)
Triaged by Merita Norton, NP

## 2024-01-13 NOTE — Discharge Instructions (Signed)
Your symptoms have been present for 2 weeks and therefore we will provide you with bacterial coverage as they are most likely causing symptoms to linger  Begin Augmentin every morning and every evening for 7 days  May take 2 puffs of albuterol inhaler every 4-6 hours as needed for any shortness of breath    You can take Tylenol and/or Ibuprofen as needed for fever reduction and pain relief.   For cough: honey 1/2 to 1 teaspoon (you can dilute the honey in water or another fluid).  You can also use guaifenesin and dextromethorphan for cough. You can use a humidifier for chest congestion and cough.  If you don't have a humidifier, you can sit in the bathroom with the hot shower running.      For sore throat: try warm salt water gargles, cepacol lozenges, throat spray, warm tea or water with lemon/honey, popsicles or ice, or OTC cold relief medicine for throat discomfort.   For congestion: take a daily anti-histamine like Zyrtec, Claritin, and a oral decongestant, such as pseudoephedrine.  You can also use Flonase 1-2 sprays in each nostril daily.   It is important to stay hydrated: drink plenty of fluids (water, gatorade/powerade/pedialyte, juices, or teas) to keep your throat moisturized and help further relieve irritation/discomfort.

## 2024-01-29 DIAGNOSIS — H43813 Vitreous degeneration, bilateral: Secondary | ICD-10-CM | POA: Diagnosis not present

## 2024-01-29 DIAGNOSIS — M3501 Sicca syndrome with keratoconjunctivitis: Secondary | ICD-10-CM | POA: Diagnosis not present

## 2024-02-10 ENCOUNTER — Other Ambulatory Visit: Payer: Self-pay | Admitting: Family Medicine

## 2024-02-10 ENCOUNTER — Ambulatory Visit (INDEPENDENT_AMBULATORY_CARE_PROVIDER_SITE_OTHER): Payer: PPO

## 2024-02-10 VITALS — Ht 65.5 in | Wt 176.0 lb

## 2024-02-10 DIAGNOSIS — Z0000 Encounter for general adult medical examination without abnormal findings: Secondary | ICD-10-CM | POA: Diagnosis not present

## 2024-02-10 DIAGNOSIS — Z122 Encounter for screening for malignant neoplasm of respiratory organs: Secondary | ICD-10-CM

## 2024-02-10 NOTE — Patient Instructions (Signed)
 Erin Hinton , Thank you for taking time to come for your Medicare Wellness Visit. I appreciate your ongoing commitment to your health goals. Please review the following plan we discussed and let me know if I can assist you in the future.   Referrals/Orders/Follow-Ups/Clinician Recommendations:   Referral for lung cancer screening to Pulmonology made.  This is a list of the screening recommended for you and due dates:  Health Maintenance  Topic Date Due   Screening for Lung Cancer  Never done   COVID-19 Vaccine (5 - 2024-25 season) 08/18/2023   Mammogram  02/18/2024   Medicare Annual Wellness Visit  02/09/2025   Colon Cancer Screening  11/20/2025   DTaP/Tdap/Td vaccine (4 - Td or Tdap) 08/26/2030   Pneumonia Vaccine  Completed   Flu Shot  Completed   DEXA scan (bone density measurement)  Completed   Hepatitis C Screening  Completed   Zoster (Shingles) Vaccine  Completed   HPV Vaccine  Aged Out    Advanced directives: (Copy Requested) Please bring a copy of your health care power of attorney and living will to the office to be added to your chart at your convenience.  Next Medicare Annual Wellness Visit scheduled for next year: Yes 02/11/24 @ 10:50am televisit

## 2024-02-10 NOTE — Progress Notes (Signed)
 Subjective:   Erin Hinton is a 78 y.o. who presents for a Medicare Wellness preventive visit.  Visit Complete: Virtual I connected with  Erin Hinton on 02/10/24 by a audio enabled telemedicine application and verified that I am speaking with the correct person using two identifiers.  Patient Location: Home  Provider Location: Office/Clinic  I discussed the limitations of evaluation and management by telemedicine. The patient expressed understanding and agreed to proceed.  Vital Signs: Because this visit was a virtual/telehealth visit, some criteria may be missing or patient reported. Any vitals not documented were not able to be obtained and vitals that have been documented are patient reported.  VideoDeclined- This patient declined Librarian, academic. Therefore the visit was completed with audio only.  AWV Questionnaire: Yes: Patient Medicare AWV questionnaire was completed by the patient on 02/03/24; I have confirmed that all information answered by patient is correct and no changes since this date.  Cardiac Risk Factors include: advanced age (>32men, >4 women);dyslipidemia;hypertension    Objective:    Today's Vitals   02/10/24 1055  Weight: 176 lb (79.8 kg)  Height: 5' 5.5" (1.664 m)   Body mass index is 28.84 kg/m.     02/10/2024   11:13 AM 02/05/2023    9:50 AM 12/06/2022    9:46 AM 11/22/2021   10:56 AM 11/15/2020   11:29 AM 09/07/2020   11:15 AM 11/18/2019   10:56 AM  Advanced Directives  Does Patient Have a Medical Advance Directive? Yes Yes Yes No Yes Yes No  Type of Estate agent of Erin Hinton;Living will Healthcare Power of [place];Living will   Healthcare Power of [place];Living will Healthcare Power of [place];Living will   Does patient want to make changes to medical advance directive?   No - Patient declined  No - Patient declined    Copy of Healthcare Power of Attorney in Chart? No - copy  requested No - copy requested   No - copy requested No - copy requested   Would patient like information on creating a medical advance directive?    No - Patient declined   No - Patient declined    Current Medications (verified) Outpatient Encounter Medications as of 02/10/2024  Medication Sig   acetaminophen (TYLENOL) 500 MG tablet Take 500 mg by mouth every 6 (six) hours as needed (for pain.).   albuterol (VENTOLIN HFA) 108 (90 Base) MCG/ACT inhaler Inhale 2 puffs into the lungs every 4 (four) hours as needed for wheezing or shortness of breath.   amoxicillin-clavulanate (AUGMENTIN) 875-125 MG tablet Take 1 tablet by mouth every 12 (twelve) hours.   Ascorbic Acid (VITAMIN C WITH ROSE HIPS) 1000 MG tablet Take 1,000 mg by mouth daily.   Calcium Carb-Cholecalciferol (CALCIUM 600 + D PO) Take 1 capsule by mouth daily.   Calcium-Magnesium-Zinc (CAL-MAG-ZINC PO) Take 1 tablet by mouth daily.   Cholecalciferol (VITAMIN D) 50 MCG (2000 UT) tablet Take 2,000 Units by mouth daily.   escitalopram (LEXAPRO) 20 MG tablet TAKE 1 TABLET BY MOUTH EVERY DAY   fluticasone (FLONASE) 50 MCG/ACT nasal spray PLACE 2 SPRAYS INTO BOTH NOSTRILS DAILY AS NEEDED FOR ALLERGIES.   hydrochlorothiazide (HYDRODIURIL) 25 MG tablet TAKE 1 TABLET BY MOUTH EVERY DAY   Omega-3 Fatty Acids (FISH OIL) 1000 MG CAPS Take 1,000 mg by mouth daily.   vitamin E 1000 UNIT capsule Take 1 capsule by mouth daily.   No facility-administered encounter medications on file as of 02/10/2024.  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 ER positive: 51-90%   Depression     Heart murmur    in her 30's and it is not a problem now   Hyperlipidemia    Hyperlipidemia    Hypertension    Osteopenia    Personal history of radiation therapy    Shortness of breath dyspnea    due to excercise only   Past Surgical History:  Procedure Laterality Date   BREAST BIOPSY Left 01/27/2019   Affirm Bx- Coil Clip- HIGH-GRADE DUCTAL CARCINOMA IN SITU, WITH COMEDONECROSIS.   BREAST EXCISIONAL BIOPSY Right 01/2002   neg. no lymph node involvement   BREAST EXCISIONAL BIOPSY Left 03/02/2019   lumpectomy DCIS   BREAST LUMPECTOMY Left 03/02/2019   left lumpectomy dcis with NL   BREAST LUMPECTOMY Left 03/02/2019   Procedure: BREAST WIDE EXCISION LEFT, WITH WIRE LOCATION;  Surgeon: Earline Mayotte, MD;  Location: [facility] ORS;  Service: General;  Laterality: Left;   BREAST SURGERY  1998   breast biopsy DCIS   CATARACT EXTRACTION W/PHACO Right 05/01/2016   Procedure: CATARACT EXTRACTION PHACO AND INTRAOCULAR LENS PLACEMENT (IOC);  Surgeon: Galen Manila, MD;  Location: [facility] ORS;  Service: Ophthalmology;  Laterality: Right;  [place] 00:53 AP% 21.7 CDE 11.50 fluid pack lot # [id number]   CATARACT EXTRACTION W/PHACO Left 06/05/2016   Procedure: CATARACT EXTRACTION PHACO AND INTRAOCULAR LENS PLACEMENT (IOC);  Surgeon: Galen Manila, MD;  Location: [facility] ORS;  Service: Ophthalmology;  Laterality: Left;  [place] 01:04 AP% 22.2 CDE 14.34 fluid pack lot # [id number]   COLONOSCOPY WITH PROPOFOL N/A 11/21/2015   Procedure: COLONOSCOPY WITH PROPOFOL;  Surgeon: Wallace Cullens, MD;  Location: [facility] ENDOSCOPY;  Service: Gastroenterology;  Laterality: N/A;   SKIN BIOPSY  01/01/2023   Upper Back   Family History  Problem Relation Age of Onset   Ovarian cancer Mother    Heart disease Sister    Lung cancer Sister    Lymphoma Father    Heart disease Father        MI   Hypertension Father    Alcohol abuse Brother    Lung cancer Brother    Breast cancer Sister 48   Heart disease Sister    Heart attack  Sister    Breast cancer Daughter 43       Erin Hinton, LCIS   Breast cancer Paternal Aunt 37   Colon cancer Neg Hx    Social History   Socioeconomic History   Marital status: Widowed    Spouse name: Not on file   Number of children: Not on file   Years of education: Not on file   Highest education level: Not on file  Occupational History   Not on file  Tobacco Use   Smoking status: Former    Current packs/day: 0.00    Average packs/day: 0.5 packs/day for 45.0 years (22.5 ttl pk-yrs)    Types: Cigarettes, E-cigarettes    Start date: 01/21/1971    Quit date: 01/22/2016    Years since quitting: 8.0    Passive exposure: Past   Smokeless tobacco: Never   Tobacco comments:  also uses e-cigaretts on occasion  Vaping Use   Vaping status: Some Days   Substances: Nicotine, Flavoring  Substance and Sexual Activity   Alcohol use: No    Alcohol/week: 0.0 standard drinks of alcohol   Drug use: No   Sexual activity: Never  Other Topics Concern   Not on file  Social History Narrative   Not on file   Social Drivers of Health   Financial Resource Strain: Low Risk  (02/10/2024)   Overall Financial Resource Strain (CARDIA)    Difficulty of Paying Living Expenses: Not hard at all  Food Insecurity: No Food Insecurity (02/10/2024)   Hunger Vital Sign    Worried About Running Out of Food in the Last Year: Never true    Ran Out of Food in the Last Year: Never true  Transportation Needs: No Transportation Needs (02/10/2024)   PRAPARE - Administrator, Civil Service (Medical): No    Lack of Transportation (Non-Medical): No  Physical Activity: Insufficiently Active (02/10/2024)   Exercise Vital Sign    Days of Exercise per Week: 3 days    Minutes of Exercise per Session: 30 min  Stress: No Stress Concern Present (02/10/2024)   [employer] of Occupational Health - Occupational Stress Questionnaire    Feeling of Stress : Only a little  Social Connections: Moderately Isolated  (02/10/2024)   Social Connection and Isolation Panel [NHANES]    Frequency of Communication with Friends and Family: Three times a week    Frequency of Social Gatherings with Friends and Family: Twice a week    Attends Religious Services: Never    Database administrator or Organizations: No    Attends Engineer, structural: Never    Marital Status: Married    Tobacco Counseling Counseling given: Not Answered Tobacco comments: also uses e-cigaretts on occasion    Clinical Intake:  Pre-visit preparation completed: No  Pain : No/denies pain     BMI - recorded: 28.84 Nutritional Status: BMI 25 -29 Overweight Nutritional Risks: None Diabetes: No  How often do you need to have someone help you when you read instructions, pamphlets, or other written materials from your doctor or pharmacy?: 1 - Never  Interpreter Needed?: No  Comments: lives alone Information entered by :: B.Jantzen Pilger,LPN   Activities of Daily Living     02/03/2024    9:23 AM  In your present state of health, do you have any difficulty performing the following activities:  Hearing? 0  Vision? 0  Difficulty concentrating or making decisions? 0  Walking or climbing stairs? 0  Dressing or bathing? 0  Doing errands, shopping? 0  Preparing Food and eating ? N  Using the Toilet? N  In the past six months, have you accidently leaked urine? Y  Do you have problems with loss of bowel control? N  Managing your Medications? N  Managing your Finances? N  Housekeeping or managing your Housekeeping? N    Patient Care Team: [place], Audrie Gallus, MD as PCP - General Galen Manila, MD as Referring Physician (Ophthalmology)  Indicate any recent Medical Services you may have received from other than [facility] providers in the past year (date may be approximate).     Assessment:   This is a routine wellness examination for [place].  Hearing/Vision screen Hearing Screening - Comments:: Pt says her hearing is  not as good in loud places but sufficient Vision Screening - Comments:: Pt says she wears glasses and her vision is good  [place]  [facility] Porfilio   Goals Addressed             This Visit's Progress    Patient Stated   On track    02/10/24- I will continue to take medications as prescribed.      COMPLETED: Patient Stated       09/07/2020, I will maintain and continue medications as prescribed. I will try to start back walking soon.     Patient Stated       02/10/24-Stay active and healthy.       Depression Screen     02/10/2024   11:08 AM 02/05/2023    9:47 AM 01/30/2022   10:29 AM 09/07/2020   11:16 AM 09/02/2019    9:19 AM 08/21/2018   10:41 AM 07/10/2017   10:00 AM  PHQ 2/9 Scores  PHQ - 2 Score 0 0 0 0 0 0 1  PHQ- 9 Score    0  0     Fall Risk     02/03/2024    9:23 AM 02/05/2023    9:52 AM 08/07/2022    3:52 PM 01/30/2022   10:29 AM 09/07/2020   11:16 AM  Fall Risk   Falls in the past year? 0 0 0 0 0  Number falls in past yr:  0  0 0  Injury with Fall?  0  0 0  Risk for fall due to : No Fall Risks No Fall Risks  No Fall Risks Medication side effect  Follow up Education provided;Falls prevention discussed Falls prevention discussed;Falls evaluation completed Falls evaluation completed Falls evaluation completed Falls evaluation completed;Falls prevention discussed    MEDICARE RISK AT HOME:  Medicare Risk at Home Any stairs in or around the home?: (Patient-Rptd) Yes If so, are there any without handrails?: (Patient-Rptd) No Home free of loose throw rugs in walkways, pet beds, electrical cords, etc?: (Patient-Rptd) Yes Adequate lighting in your home to reduce risk of falls?: (Patient-Rptd) Yes Life alert?: (Patient-Rptd) No Use of a cane, walker or w/c?: (Patient-Rptd) No Grab bars in the bathroom?: (Patient-Rptd) No Shower chair or bench in shower?: (Patient-Rptd) No Elevated toilet seat or a handicapped toilet?: (Patient-Rptd) Yes  TIMED UP AND GO:  Was the test  performed?  No  Cognitive Function: 6CIT completed    09/07/2020   11:18 AM 08/21/2018   10:41 AM 07/10/2017   10:01 AM 06/26/2016    2:00 PM  MMSE - Mini Mental State Exam  Orientation to time 5 5 5 5   Orientation to Place 5 5 5 5   Registration 3 3 3 3   Attention/ Calculation 5 0 0 0  Recall 3 3 2 3   Language- name 2 objects  0 0 0  Language- repeat 1 1 1 1   Language- follow 3 step command  3 3 3   Language- read & follow direction  0 0 0  Write a sentence  0 0 0  Copy design  0 0 0  Total score  20 19 20         02/10/2024   11:17 AM 02/05/2023   10:03 AM  6CIT Screen  What Year? 0 points 0 points  What month? 0 points 0 points  What time? 0 points 0 points  Count back from 20 0 points 0 points  Months in reverse 0 points 0 points  Repeat phrase 0 points 0 points  Total Score 0 points 0 points    Immunizations Immunization History  Administered Date(s) Administered  Fluad Quad(high Dose 65+) 08/26/2019, 09/27/2022   Influenza Whole 09/16/2008, 11/10/2009, 09/26/2010   Influenza, High Dose Seasonal PF 09/11/2014, 09/21/2015, 07/23/2016, 08/20/2017, 09/17/2018, 08/26/2020, 09/14/2021, 10/01/2023   Influenza-Unspecified 09/16/2013   PFIZER Comirnaty(Gray Top)Covid-19 Tri-Sucrose Vaccine 03/31/2021   PFIZER(Purple Top)SARS-COV-2 Vaccination 01/07/2020, 01/28/2020, 10/28/2020   PNEUMOCOCCAL CONJUGATE-20 03/31/2021   Pneumococcal Conjugate-13 05/17/2015   Pneumococcal Polysaccharide-23 03/24/2004, 11/24/2009, 04/07/2014   Respiratory Syncytial Virus Vaccine,Recomb Aduvanted(Arexvy) 11/29/2022   Td 09/09/2006, 09/14/2009   Tdap 08/26/2020   Zoster Recombinant(Shingrix) 05/30/2020, 08/01/2020   Zoster, Live 12/06/2010    Screening Tests Health Maintenance  Topic Date Due   Lung Cancer Screening  Never done   COVID-19 Vaccine (5 - 2024-25 season) 08/18/2023   MAMMOGRAM  02/18/2024   Medicare Annual Wellness (AWV)  02/09/2025   Colonoscopy  11/20/2025   DTaP/Tdap/Td  (4 - Td or Tdap) 08/26/2030   Pneumonia Vaccine 24+ Years old  Completed   INFLUENZA VACCINE  Completed   DEXA SCAN  Completed   Hepatitis C Screening  Completed   Zoster Vaccines- Shingrix  Completed   HPV VACCINES  Aged Out    Health Maintenance  Health Maintenance Due  Topic Date Due   Lung Cancer Screening  Never done   COVID-19 Vaccine (5 - 2024-25 season) 08/18/2023   Health Maintenance Items Addressed: Lung Cancer Screening ordered  Additional Screening:  Vision Screening: Recommended annual ophthalmology exams for early detection of glaucoma and other disorders of the eye.  Dental Screening: Recommended annual dental exams for proper oral hygiene  Community Resource Referral / Chronic Care Management: CRR required this visit?  No   CCM required this visit?  Appt scheduled with PCP    Plan:     I have personally reviewed and noted the following in the patient's chart:   Medical and social history Use of alcohol, tobacco or illicit drugs  Current medications and supplements including opioid prescriptions. Patient is not currently taking opioid prescriptions. Functional ability and status Nutritional status Physical activity Advanced directives List of other physicians Hospitalizations, surgeries, and ER visits in previous 12 months Vitals Screenings to include cognitive, depression, and falls Referrals and appointments  In addition, I have reviewed and discussed with patient certain preventive protocols, quality metrics, and best practice recommendations. A written personalized care plan for preventive services as well as general preventive health recommendations were provided to patient.    Sue Lush, LPN   [id number]   After Visit Summary: (MyChart) Due to this being a telephonic visit, the after visit summary with patients personalized plan was offered to patient via MyChart   Notes: Please refer to Routing Comments.

## 2024-02-11 DIAGNOSIS — D2272 Melanocytic nevi of left lower limb, including hip: Secondary | ICD-10-CM | POA: Diagnosis not present

## 2024-02-11 DIAGNOSIS — D225 Melanocytic nevi of trunk: Secondary | ICD-10-CM | POA: Diagnosis not present

## 2024-02-11 DIAGNOSIS — Z85828 Personal history of other malignant neoplasm of skin: Secondary | ICD-10-CM | POA: Diagnosis not present

## 2024-02-11 DIAGNOSIS — D2262 Melanocytic nevi of left upper limb, including shoulder: Secondary | ICD-10-CM | POA: Diagnosis not present

## 2024-02-11 DIAGNOSIS — D485 Neoplasm of uncertain behavior of skin: Secondary | ICD-10-CM | POA: Diagnosis not present

## 2024-02-11 DIAGNOSIS — C44719 Basal cell carcinoma of skin of left lower limb, including hip: Secondary | ICD-10-CM | POA: Diagnosis not present

## 2024-02-11 DIAGNOSIS — L570 Actinic keratosis: Secondary | ICD-10-CM | POA: Diagnosis not present

## 2024-02-11 DIAGNOSIS — D2261 Melanocytic nevi of right upper limb, including shoulder: Secondary | ICD-10-CM | POA: Diagnosis not present

## 2024-02-11 DIAGNOSIS — L820 Inflamed seborrheic keratosis: Secondary | ICD-10-CM | POA: Diagnosis not present

## 2024-02-11 DIAGNOSIS — D0462 Carcinoma in situ of skin of left upper limb, including shoulder: Secondary | ICD-10-CM | POA: Diagnosis not present

## 2024-02-11 DIAGNOSIS — Z85820 Personal history of malignant melanoma of skin: Secondary | ICD-10-CM | POA: Diagnosis not present

## 2024-02-12 MED ORDER — ALBUTEROL SULFATE HFA 108 (90 BASE) MCG/ACT IN AERS: 2.0000 | INHALATION_SPRAY | RESPIRATORY_TRACT | 0 refills | Status: DC | PRN

## 2024-02-12 MED ORDER — FLUTICASONE PROPIONATE 50 MCG/ACT NA SUSP: 2.0000 | Freq: Every day | NASAL | 0 refills | Status: DC | PRN

## 2024-02-12 NOTE — Telephone Encounter (Signed)
-----   Message from [facility] sent at 02/10/2024  8:06 PM EST ----- Please refill her albuterol  Let her know we will discuss lung cancer screening referral at her appointment  Thanks ----- Message ----- From: Sue Lush, LPN Sent: 1/61/0960  11:31 AM EST To: Judy Pimple, MD  Hi! Pt consents to have LCS and desires Pulmonologist in [place]. Pt also relays she is having problems with sleep some nights. Pt says she had bronchiolitis a couple of weeks ago and was given an Albuterol inhaler which has helped but she will run out before PE appt with you on 02/21/24. She is requesting one refili. Thank you! Steward Drone

## 2024-02-12 NOTE — Telephone Encounter (Signed)
 Rx refilled and pt notified of Dr. Royden Purlower's comments

## 2024-02-19 ENCOUNTER — Ambulatory Visit
Admission: RE | Admit: 2024-02-19 | Discharge: 2024-02-19 | Disposition: A | Discharge status: Home / Self Care | Payer: PPO | Source: Ambulatory Visit | Attending: Family Medicine | Admitting: Family Medicine

## 2024-02-19 DIAGNOSIS — Z1231 Encounter for screening mammogram for malignant neoplasm of breast: Secondary | ICD-10-CM | POA: Diagnosis not present

## 2024-02-24 ENCOUNTER — Other Ambulatory Visit: Payer: Self-pay | Admitting: Family Medicine

## 2024-02-24 DIAGNOSIS — R928 Other abnormal and inconclusive findings on diagnostic imaging of breast: Secondary | ICD-10-CM

## 2024-02-26 ENCOUNTER — Encounter: Payer: Self-pay | Admitting: Family Medicine

## 2024-02-26 ENCOUNTER — Ambulatory Visit: Payer: PPO | Admitting: Family Medicine

## 2024-02-26 ENCOUNTER — Ambulatory Visit (INDEPENDENT_AMBULATORY_CARE_PROVIDER_SITE_OTHER)
Admission: RE | Admit: 2024-02-26 | Discharge: 2024-02-26 | Disposition: A | Discharge status: Home / Self Care | Source: Ambulatory Visit | Attending: Family Medicine | Admitting: Family Medicine

## 2024-02-26 VITALS — BP 144/90 | HR 69 | Temp 97.9°F | Ht 64.5 in | Wt 184.2 lb

## 2024-02-26 DIAGNOSIS — Z87891 Personal history of nicotine dependence: Secondary | ICD-10-CM | POA: Diagnosis not present

## 2024-02-26 DIAGNOSIS — J209 Acute bronchitis, unspecified: Secondary | ICD-10-CM

## 2024-02-26 DIAGNOSIS — Z853 Personal history of malignant neoplasm of breast: Secondary | ICD-10-CM

## 2024-02-26 DIAGNOSIS — E559 Vitamin D deficiency, unspecified: Secondary | ICD-10-CM | POA: Diagnosis not present

## 2024-02-26 DIAGNOSIS — Z1211 Encounter for screening for malignant neoplasm of colon: Secondary | ICD-10-CM

## 2024-02-26 DIAGNOSIS — Z789 Other specified health status: Secondary | ICD-10-CM | POA: Diagnosis not present

## 2024-02-26 DIAGNOSIS — M810 Age-related osteoporosis without current pathological fracture: Secondary | ICD-10-CM | POA: Diagnosis not present

## 2024-02-26 DIAGNOSIS — F419 Anxiety disorder, unspecified: Secondary | ICD-10-CM | POA: Diagnosis not present

## 2024-02-26 DIAGNOSIS — R062 Wheezing: Secondary | ICD-10-CM | POA: Diagnosis not present

## 2024-02-26 DIAGNOSIS — R7303 Prediabetes: Secondary | ICD-10-CM | POA: Diagnosis not present

## 2024-02-26 DIAGNOSIS — E7800 Pure hypercholesterolemia, unspecified: Secondary | ICD-10-CM | POA: Diagnosis not present

## 2024-02-26 DIAGNOSIS — G479 Sleep disorder, unspecified: Secondary | ICD-10-CM | POA: Diagnosis not present

## 2024-02-26 DIAGNOSIS — R911 Solitary pulmonary nodule: Secondary | ICD-10-CM

## 2024-02-26 DIAGNOSIS — R9389 Abnormal findings on diagnostic imaging of other specified body structures: Secondary | ICD-10-CM | POA: Insufficient documentation

## 2024-02-26 DIAGNOSIS — Z0000 Encounter for general adult medical examination without abnormal findings: Secondary | ICD-10-CM

## 2024-02-26 DIAGNOSIS — I10 Essential (primary) hypertension: Secondary | ICD-10-CM | POA: Diagnosis not present

## 2024-02-26 LAB — CBC WITH DIFFERENTIAL/PLATELET
Basophils Absolute: 0 10*3/uL (ref 0.0–0.1)
Basophils Relative: 0.7 % (ref 0.0–3.0)
Eosinophils Absolute: 0.3 10*3/uL (ref 0.0–0.7)
Eosinophils Relative: 5.6 % — ABNORMAL HIGH (ref 0.0–5.0)
HCT: 41.9 % (ref 36.0–46.0)
Hemoglobin: 14 g/dL (ref 12.0–15.0)
Lymphocytes Relative: 15 % (ref 12.0–46.0)
Lymphs Abs: 0.9 10*3/uL (ref 0.7–4.0)
MCHC: 33.3 g/dL (ref 30.0–36.0)
MCV: 99.3 fl (ref 78.0–100.0)
Monocytes Absolute: 0.5 10*3/uL (ref 0.1–1.0)
Monocytes Relative: 8.6 % (ref 3.0–12.0)
Neutro Abs: 4.3 10*3/uL (ref 1.4–7.7)
Neutrophils Relative %: 70.1 % (ref 43.0–77.0)
Platelets: 232 10*3/uL (ref 150.0–400.0)
RBC: 4.22 Mil/uL (ref 3.87–5.11)
RDW: 13.2 % (ref 11.5–15.5)
WBC: 6.1 10*3/uL (ref 4.0–10.5)

## 2024-02-26 LAB — VITAMIN D 25 HYDROXY (VIT D DEFICIENCY, FRACTURES): VITD: 41.16 ng/mL (ref 30.00–100.00)

## 2024-02-26 LAB — COMPREHENSIVE METABOLIC PANEL
ALT: 15 U/L (ref 0–35)
AST: 20 U/L (ref 0–37)
Albumin: 4.6 g/dL (ref 3.5–5.2)
Alkaline Phosphatase: 62 U/L (ref 39–117)
BUN: 15 mg/dL (ref 6–23)
CO2: 32 meq/L (ref 19–32)
Calcium: 10.2 mg/dL (ref 8.4–10.5)
Chloride: 101 meq/L (ref 96–112)
Creatinine, Ser: 0.76 mg/dL (ref 0.40–1.20)
GFR: 76.57 mL/min (ref >60.00)
Glucose, Bld: 98 mg/dL (ref 70–99)
Potassium: 4.1 meq/L (ref 3.5–5.1)
Sodium: 140 meq/L (ref 135–145)
Total Bilirubin: 0.5 mg/dL (ref 0.2–1.2)
Total Protein: 7 g/dL (ref 6.0–8.3)

## 2024-02-26 LAB — LIPID PANEL
Cholesterol: 261 mg/dL — ABNORMAL HIGH (ref 0–200)
HDL: 74.1 mg/dL (ref >39.00)
LDL Cholesterol: 166 mg/dL — ABNORMAL HIGH (ref 0–99)
NonHDL: 187.08
Total CHOL/HDL Ratio: 4
Triglycerides: 106 mg/dL (ref 0.0–149.0)
VLDL: 21.2 mg/dL (ref 0.0–40.0)

## 2024-02-26 LAB — HEMOGLOBIN A1C: Hgb A1c MFr Bld: 5.8 % (ref 4.6–6.5)

## 2024-02-26 LAB — TSH: TSH: 1.56 u[IU]/mL (ref 0.35–5.50)

## 2024-02-26 MED ORDER — ALBUTEROL SULFATE HFA 108 (90 BASE) MCG/ACT IN AERS: 2.0000 | INHALATION_SPRAY | RESPIRATORY_TRACT | 3 refills | Status: AC | PRN

## 2024-02-26 MED ORDER — PREDNISONE 10 MG PO TABS: ORAL_TABLET | ORAL | 0 refills | Status: DC

## 2024-02-26 NOTE — Assessment & Plan Note (Signed)
 Still uses e cig 3-4 puff per day  Discussed cessation  Declines lung cancer screening   Has not been formally tested for copd

## 2024-02-26 NOTE — Assessment & Plan Note (Signed)
 A1c ordered  disc imp of low glycemic diet and wt loss to prevent DM2

## 2024-02-26 NOTE — Assessment & Plan Note (Signed)
 Disc goals for lipids and reasons to control them Rev last labs with pt Rev low sat fat diet in detail Labs today   Intol of any dose statin or zetia

## 2024-02-26 NOTE — Assessment & Plan Note (Signed)
 Reviewed UC notes from late January-was treatment with augmentin and albuterol mdi  (pt declined prednisone) Still some wheezing and shortness of breath No cough  No formal dx of copd in past   Reassuring exam Cxr today  Prescription prednisone 30 mg taper (aware of side effects) Plan follow up 2-3 wk   Strongly encouraged to consider cessation of e cig/ vaping

## 2024-02-26 NOTE — Assessment & Plan Note (Signed)
 Encouraged to cut down/ quit for health and breathing  Cxr today

## 2024-02-26 NOTE — Assessment & Plan Note (Signed)
 Continues lexapro  PHQ 0  Still having a lot of trouble sleeping  Will follow up in 2-3 wk to discuss further

## 2024-02-26 NOTE — Assessment & Plan Note (Signed)
 Trouble falling asleep  Will follow up to discu in 2-3 wk

## 2024-02-26 NOTE — Assessment & Plan Note (Signed)
 Reviewed health habits including diet and exercise and skin cancer prevention Reviewed appropriate screening tests for age  Also reviewed health mt list, fam hx and immunization status , as well as social and family history   See HPI Labs reviewed and ordered Health Maintenance  Topic Date Due   Screening for Lung Cancer  Never done   COVID-19 Vaccine (5 - 2024-25 season) 03/13/2025*   Medicare Annual Wellness Visit  02/09/2025   Mammogram  02/18/2025   Colon Cancer Screening  11/20/2025   DTaP/Tdap/Td vaccine (4 - Td or Tdap) 08/26/2030   Pneumonia Vaccine  Completed   Flu Shot  Completed   DEXA scan (bone density measurement)  Completed   Hepatitis C Screening  Completed   Zoster (Shingles) Vaccine  Completed   HPV Vaccine  Aged Out  *Topic was postponed. The date shown is not the original due date.   Diag breast imaging is scheduled tomorrow (declined breast exam)- has prior personal history of breast cancer  In 2026 may consider cologuard  Dexa ordered for July 2025 / declines osteoporosis treatment so far Discussed fall prevention, supplements and exercise for bone density  PHQ 0 Labs ordered today  Utd derm care

## 2024-02-26 NOTE — Assessment & Plan Note (Signed)
 Blood pressure is not optimal today  She is dealing with bronchitis / sleep issues  Continues hydrochlorothiazide 25 mg daily   BP: (!) 144/90  Follow up in 2-3 wk for re check  May need to add therapy

## 2024-02-26 NOTE — Assessment & Plan Note (Signed)
 D level today  Has osteoporosis  Takes ca and D

## 2024-02-26 NOTE — Progress Notes (Signed)
 Subjective:    Patient ID: Erin Hinton, female    DOB: 12-16-1946, 78 y.o.   MRN: [id number]  HPI  Here for health maintenance exam and to review chronic medical problems   Wt Readings from Last 3 Encounters:  02/26/24 184 lb 4 oz (83.6 kg)  02/10/24 176 lb (79.8 kg)  06/13/23 176 lb (79.8 kg)   31.14 kg/m  Vitals:   02/26/24 1042 02/26/24 1116  BP: (!) 146/92 (!) 144/90  Pulse: 69   Temp: 97.9 F (36.6 C)   SpO2: 94%     Immunization History  Administered Date(s) Administered   Fluad Quad(high Dose 65+) 08/26/2019, 09/27/2022   Influenza Whole 09/16/2008, 11/10/2009, 09/26/2010   Influenza, High Dose Seasonal PF 09/11/2014, 09/21/2015, 07/23/2016, 08/20/2017, 09/17/2018, 08/26/2020, 09/14/2021, 10/01/2023   Influenza-Unspecified 09/16/2013   PFIZER Comirnaty(Gray Top)Covid-19 Tri-Sucrose Vaccine 03/31/2021   PFIZER(Purple Top)SARS-COV-2 Vaccination 01/07/2020, 01/28/2020, 10/28/2020   PNEUMOCOCCAL CONJUGATE-20 03/31/2021   Pneumococcal Conjugate-13 05/17/2015   Pneumococcal Polysaccharide-23 03/24/2004, 11/24/2009, 04/07/2014   Respiratory Syncytial Virus Vaccine,Recomb Aduvanted(Arexvy) 11/29/2022   Td 09/09/2006, 09/14/2009   Tdap 08/26/2020   Zoster Recombinant(Shingrix) 05/30/2020, 08/01/2020   Zoster, Live 12/06/2010    Health Maintenance Due  Topic Date Due   Lung Cancer Screening  Never done   Smoking status  E cigarettes /vaping  3-4 puffs per day   Declines lung cancer screening program    Had uri in late January  Seen at [facility]  Was treated with augmentin and albuterol mdi (declined prednisone)  Still has some shortness of breath  Some wheezing  Cough is improved  Mucous is clear   Pulse ox 94%    Mammogram 02/2024 noted asymmetry in right breast  Has addn images upcoming and [place]  Had breast cancer in 2020 , lumpectomy and radiation and was on tamoxifen Self breast exam- no lumps or changes   Gyn health No problems    Colon  cancer screening - colonoscopy 11/2015 with 10 y recall / is over 26 however  May consider cologuard in the future    Bone health  Dexa  06/2021   osteoporosis  Declines treatment  Falls-none  Fractures-none  Supplements ca and D  Last vitamin D Lab Results  Component Value Date   VD25OH 39.41 02/20/2023    Exercise  Uses a sit down pedal machine 30 minutes  Uses cans for light weight lifting     Sees derm Has lesion on leg-needs more surgery/also back and arm  Appointment upcoming     Mood    02/10/2024   11:08 AM 02/05/2023    9:47 AM 01/30/2022   10:29 AM 09/07/2020   11:16 AM 09/02/2019    9:19 AM  Depression screen PHQ 2/9  Decreased Interest 0 0 0 0 0  Down, Depressed, Hopeless 0 0 0 0 0  PHQ - 2 Score 0 0 0 0 0  Altered sleeping    0   Tired, decreased energy    0   Change in appetite    0   Feeling bad or failure about yourself     0   Trouble concentrating    0   Moving slowly or fidgety/restless    0   Suicidal thoughts    0   PHQ-9 Score    0   Difficult doing work/chores    Not difficult at all       Takes lexapro 20 mg for anxious mood   HTN bp is  stable today  No cp or palpitations or headaches or edema  No side effects to medicines  BP Readings from Last 3 Encounters:  02/26/24 (!) 144/90  06/13/23 (!) 150/80  06/04/23 124/83    Hydrochlorothiazide 25 mg daily   Lab Results  Component Value Date   NA 140 02/20/2023   K 4.3 02/20/2023   CO2 31 02/20/2023   GLUCOSE 84 02/20/2023   BUN 15 02/20/2023   CREATININE 0.75 02/20/2023   CALCIUM 10.0 02/20/2023   GFR 78.35 02/20/2023   GFRNONAA 88.56 11/28/2010   Due for labs   Hyperlipidemia Lab Results  Component Value Date   CHOL 188 02/20/2023   HDL 57.90 02/20/2023   LDLCALC 109 (H) 02/20/2023   LDLDIRECT 128.6 11/28/2010   TRIG 102.0 02/20/2023   CHOLHDL 3 02/20/2023   Intol of any dose of statin   Prediabetes Lab Results  Component Value Date   HGBA1C 5.6 02/20/2023    HGBA1C 5.5 01/30/2022   HGBA1C 5.7 09/09/2020   Due for labs as well   Sleeping problems   Patient Active Problem List   Diagnosis Date Noted   Acute bronchitis 02/26/2024   Sleep disorder 02/26/2024   Electronic cigarette use 02/26/2024   Neck mass 06/04/2023   History of breast cancer 12/13/2020   Vitamin D deficiency 09/19/2020   Mitral prolapse 02/11/2019   Abnormal EKG 02/10/2019   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   Family history of heart disease 07/15/2017   Prediabetes 07/15/2017   Anxiety 07/15/2017   Preoperative cardiovascular examination 04/07/2014   Colon cancer screening 03/09/2013   Routine general medical examination at a health care facility 01/30/2012   HYPERTENSION, BENIGN ESSENTIAL 02/01/2010   Osteoporosis 11/23/2008   Former smoker 10/21/2007   Hyperlipidemia 09/29/2007   Mitral valve disease 09/29/2007   FIBROCYSTIC BREAST DISEASE 09/29/2007   Allergy 09/29/2007   Past Medical History:  Diagnosis Date   Allergy    Anxiety    Breast cancer (HCC) 1996   Right breast, Dr Renaee Munda, DCIS, lumpectomy only   Cancer (HCC)    skin   Cataract    DCIS (ductal carcinoma in situ) 03/02/2019   Left, 11 mm high-grade DCIS, 10 mm margins. ER positive: 51-90%   Depression    Heart murmur    in her 30's and it is not a problem now   Hyperlipidemia    Hyperlipidemia    Hypertension    Osteopenia    Personal history of radiation therapy    Shortness of breath dyspnea    due to excercise only   Past Surgical History:  Procedure Laterality Date   BREAST BIOPSY Left 01/27/2019   Affirm Bx- Coil Clip- HIGH-GRADE DUCTAL CARCINOMA IN SITU, WITH COMEDONECROSIS.   BREAST EXCISIONAL BIOPSY Right 01/2002   neg. no lymph node involvement   BREAST EXCISIONAL BIOPSY Left 03/02/2019   lumpectomy DCIS   BREAST LUMPECTOMY Left 03/02/2019   left lumpectomy dcis with NL   BREAST LUMPECTOMY Left 03/02/2019   Procedure: BREAST WIDE EXCISION  LEFT, WITH WIRE LOCATION;  Surgeon: Earline Mayotte, MD;  Location: [facility] ORS;  Service: General;  Laterality: Left;   BREAST SURGERY  1998   breast biopsy DCIS   CATARACT EXTRACTION W/PHACO Right 05/01/2016   Procedure: CATARACT EXTRACTION PHACO AND INTRAOCULAR LENS PLACEMENT (IOC);  Surgeon: Galen Manila, MD;  Location: [facility] ORS;  Service: Ophthalmology;  Laterality: Right;  [place] 00:53 AP% 21.7 CDE 11.50 fluid pack lot #  [id number]   CATARACT EXTRACTION W/PHACO Left 06/05/2016   Procedure: CATARACT EXTRACTION PHACO AND INTRAOCULAR LENS PLACEMENT (IOC);  Surgeon: Galen Manila, MD;  Location: [facility] OR[facility];  Service: Ophthalmology;  Laterality: Left;  [place] 01:04 AP% 22.2 CDE 14.34 fluid pack lot # [id number]   COLONOSCOPY WITH PROPOFOL N/A 11/21/2015   Procedure: COLONOSCOPY WITH PROPOFOL;  Surgeon: Wallace Cullens, MD;  Location: [facility];  Service: Gastroenterology;  Laterality: N/A;   EYE SURGERY     Cataract   SKIN BIOPSY  01/01/2023   Upper Back   Social History   Tobacco Use   Smoking status: Former    Current packs/day: 0.00    Average packs/day: 0.5 packs/day for 45.0 years (22.5 ttl pk-yrs)    Types: Cigarettes, E-cigarettes    Start date: 01/21/1971    Quit date: 01/22/2016    Years since quitting: 8.1    Passive exposure: Past   Smokeless tobacco: Never   Tobacco comments:    also uses e-cigaretts on occasion  Vaping Use   Vaping status: Some Days   Substances: Nicotine, Flavoring  Substance Use Topics   Alcohol use: No   Drug use: No   Family History  Problem Relation Age of Onset   Ovarian cancer Mother    Heart disease Sister    Lung cancer Sister    Lymphoma Father    Heart disease Father        MI   Hypertension Father    Cancer Father    Alcohol abuse Brother    Lung cancer Brother    Breast cancer Sister 24   Heart disease Sister    Heart attack Sister    Breast cancer Daughter 46       Loleta Dicker, LCIS   Breast cancer Paternal Aunt 55   Colon  cancer Neg Hx    Allergies  Allergen Reactions   Alendronate Sodium     GI pain   Atorvastatin     Muscle pain   Crestor [Rosuvastatin Calcium]     Muscle pain   Evista [Raloxifene] Other (See Comments)    Breast tenderness   Rosuvastatin Other (See Comments)   Sertraline Hcl     Did not help, made anxiety worse   Pseudoephedrine Palpitations   Current Outpatient Medications on File Prior to Visit  Medication Sig Dispense Refill   acetaminophen (TYLENOL) 500 MG tablet Take 500 mg by mouth every 6 (six) hours as needed (for pain.).     Ascorbic Acid (VITAMIN C WITH ROSE HIPS) 1000 MG tablet Take 1,000 mg by mouth daily.     Calcium Carb-Cholecalciferol (CALCIUM 600 + D PO) Take 1 capsule by mouth daily.     Calcium-Magnesium-Zinc (CAL-MAG-ZINC PO) Take 1 tablet by mouth daily.     Cholecalciferol (VITAMIN D) 50 MCG (2000 UT) tablet Take 2,000 Units by mouth daily.     escitalopram (LEXAPRO) 20 MG tablet TAKE 1 TABLET BY MOUTH EVERY DAY 90 tablet 2   fluticasone (FLONASE) 50 MCG/ACT nasal spray Place 2 sprays into both nostrils daily as needed for allergies. 48 mL 0   hydrochlorothiazide (HYDRODIURIL) 25 MG tablet TAKE 1 TABLET BY MOUTH EVERY DAY 90 tablet 2   Omega-3 Fatty Acids (FISH OIL) 1000 MG CAPS Take 1,000 mg by mouth daily.     vitamin E 1000 UNIT capsule Take 1 capsule by mouth daily.     No current facility-administered medications on file prior to visit.    Review of  Systems  Constitutional:  Positive for fatigue. Negative for activity change, appetite change, fever and unexpected weight change.  HENT:  Negative for congestion, ear pain, rhinorrhea, sinus pressure and sore throat.   Eyes:  Negative for pain, redness and visual disturbance.  Respiratory:  Positive for cough, chest tightness and wheezing. Negative for shortness of breath.   Cardiovascular:  Negative for chest pain and palpitations.  Gastrointestinal:  Negative for abdominal pain, blood in stool,  constipation and diarrhea.  Endocrine: Negative for polydipsia and polyuria.  Genitourinary:  Negative for dysuria, frequency and urgency.  Musculoskeletal:  Negative for arthralgias, back pain and myalgias.  Skin:  Negative for pallor and rash.  Allergic/Immunologic: Negative for environmental allergies.  Neurological:  Negative for dizziness, syncope and headaches.  Hematological:  Negative for adenopathy. Does not bruise/bleed easily.  Psychiatric/Behavioral:  Positive for sleep disturbance. Negative for decreased concentration and dysphoric mood. The patient is not nervous/anxious.        Objective:   Physical Exam Constitutional:      General: She is not in acute distress.    Appearance: Normal appearance. She is well-developed and normal weight. She is not ill-appearing or diaphoretic.  HENT:     Head: Normocephalic and atraumatic.     Right Ear: Tympanic membrane, ear canal and external ear normal.     Left Ear: Tympanic membrane, ear canal and external ear normal.     Nose: Nose normal. No congestion.     Mouth/Throat:     Mouth: Mucous membranes are moist.     Pharynx: Oropharynx is clear. No posterior oropharyngeal erythema.  Eyes:     General: No scleral icterus.    Extraocular Movements: Extraocular movements intact.     Conjunctiva/sclera: Conjunctivae normal.     Pupils: Pupils are equal, round, and reactive to light.  Neck:     Thyroid: No thyromegaly.     Vascular: No carotid bruit or JVD.  Cardiovascular:     Rate and Rhythm: Normal rate and regular rhythm.     Pulses: Normal pulses.     Heart sounds: Normal heart sounds.     No gallop.  Pulmonary:     Effort: Pulmonary effort is normal. No respiratory distress.     Breath sounds: Wheezing and rhonchi present.     Comments: Diffusely distant bs Few scattered rhonchi  Wheeze only on forced /end exp   No rales or crackles  Chest:     Chest wall: No tenderness.  Abdominal:     General: Bowel sounds are  normal. There is no distension or abdominal bruit.     Palpations: Abdomen is soft. There is no mass.     Tenderness: There is no abdominal tenderness.     Hernia: No hernia is present.  Genitourinary:    Comments: Declined breast exam Has exam/US planned tomorrow  Musculoskeletal:        General: No tenderness. Normal range of motion.     Cervical back: Normal range of motion and neck supple. No rigidity. No muscular tenderness.     Right lower leg: No edema.     Left lower leg: No edema.     Comments: Borderline kyphosis   Lymphadenopathy:     Cervical: No cervical adenopathy.  Skin:    General: Skin is warm and dry.     Coloration: Skin is not pale.     Findings: No erythema or rash.     Comments: Solar lentigines diffusely Some sks  Neurological:     Mental Status: She is alert. Mental status is at baseline.     Cranial Nerves: No cranial nerve deficit.     Motor: No abnormal muscle tone.     Coordination: Coordination normal.     Gait: Gait normal.     Deep Tendon Reflexes: Reflexes are normal and symmetric. Reflexes normal.  Psychiatric:        Mood and Affect: Mood normal.        Cognition and Memory: Cognition and memory normal.           Assessment & Plan:   Problem List Items Addressed This Visit       Cardiovascular and Mediastinum   HYPERTENSION, BENIGN ESSENTIAL   Blood pressure is not optimal today  She is dealing with bronchitis / sleep issues  Continues hydrochlorothiazide 25 mg daily   BP: (!) 144/90  Follow up in 2-3 wk for re check  May need to add therapy       Relevant Orders   CBC with Differential/Platelet   Comprehensive metabolic panel   Lipid Panel   TSH     Respiratory   Acute bronchitis   Reviewed UC notes from late January-was treatment with augmentin and albuterol mdi  (pt declined prednisone) Still some wheezing and shortness of breath No cough  No formal dx of copd in past   Reassuring exam Cxr today  Prescription  prednisone 30 mg taper (aware of side effects) Plan follow up 2-3 wk   Strongly encouraged to consider cessation of e cig/ vaping      Relevant Orders   DG Chest 2 View     Musculoskeletal and Integument   Osteoporosis   Declines treatment Open to another dexa Ordered for July  Discussed fall prevention, supplements and exercise for bone density  No falls or fractures Is smoker former/ e cig now         Relevant Orders   DG Bone Density     Other   Vitamin D deficiency   D level today  Has osteoporosis  Takes ca and D      Relevant Orders   VITAMIN D 25 Hydroxy (Vit-D Deficiency, Fractures)   Sleep disorder   Trouble falling asleep  Will follow up to discu in 2-3 wk        Routine general medical examination at a health care facility - Primary   Reviewed health habits including diet and exercise and skin cancer prevention Reviewed appropriate screening tests for age  Also reviewed health mt list, fam hx and immunization status , as well as social and family history   See HPI Labs reviewed and ordered Health Maintenance  Topic Date Due   Screening for Lung Cancer  Never done   COVID-19 Vaccine (5 - 2024-25 season) 03/13/2025*   Medicare Annual Wellness Visit  02/09/2025   Mammogram  02/18/2025   Colon Cancer Screening  11/20/2025   DTaP/Tdap/Td vaccine (4 - Td or Tdap) 08/26/2030   Pneumonia Vaccine  Completed   Flu Shot  Completed   DEXA scan (bone density measurement)  Completed   Hepatitis C Screening  Completed   Zoster (Shingles) Vaccine  Completed   HPV Vaccine  Aged Out  *Topic was postponed. The date shown is not the original due date.   Diag breast imaging is scheduled tomorrow (declined breast exam)- has prior personal history of breast cancer  In 2026 may consider cologuard  Dexa ordered for July 2025 /  declines osteoporosis treatment so far Discussed fall prevention, supplements and exercise for bone density  PHQ 0 Labs ordered today  Utd  derm care      Prediabetes   A1c ordered  disc imp of low glycemic diet and wt loss to prevent DM2       Relevant Orders   Hemoglobin A1c   Hyperlipidemia   Disc goals for lipids and reasons to control them Rev last labs with pt Rev low sat fat diet in detail Labs today   Intol of any dose statin or zetia       History of breast cancer   Former smoker   Still uses e cig 3-4 puff per day  Discussed cessation  Declines lung cancer screening   Has not been formally tested for copd       Electronic cigarette use   Encouraged to cut down/ quit for health and breathing  Cxr today       Colon cancer screening   Colonsc 2016 At 10 y mark may consider cologuard       Anxiety   Continues lexapro  PHQ 0  Still having a lot of trouble sleeping  Will follow up in 2-3 wk to discuss further

## 2024-02-26 NOTE — Assessment & Plan Note (Signed)
 Colonsc 2016 At 10 y mark may consider cologuard

## 2024-02-26 NOTE — Patient Instructions (Addendum)
 Keep pedaling  Add some strength training to your routine, this is important for bone and brain health and can reduce your risk of falls and help your body use insulin properly and regulate weight  Light weights, exercise bands , and internet videos are a good way to start  Yoga (chair or regular), machines , floor exercises or a gym with machines are also good options   Labs today    Chest xray now  We will reach out with results   Take prednisone 30 mg taper for wheezing   Follow up in 2-3 weeks for blood pressure and re check lungs and discuss sleep    You have an order for:  []   2D Mammogram  []   3D Mammogram  [x]   Bone Density   You are due in July    Please call for appointment:   [x]   [facility]  [address] [place] [address]  [phone]  []   [facility])   [address]  [place], [place] [address]  [phone]  []   [facility]      [address] [place], [place]        [phone]         []   [facility]  [address] [place], [place]  [phone]  []  [facility]   [address]   [place], [place] [address]  [phone]  []  [facility]  [address] [place], [place] [address] [phone]    Make sure to wear two piece clothing  No lotions powders or deodorants the day of the appointment Make sure to bring picture ID and insurance card.  Bring list of medications you are currently taking including any supplements.   Schedule your screening mammogram through MyChart!   Select [place] imaging sites can now be scheduled through MyChart.  Log into your MyChart account.  Go to 'Visit' (or 'Appointments' if  on mobile App) --> Schedule an  Appointment  Under 'Select a Reason for  Visit' choose the Mammogram  Screening option.  Complete the pre-visit questions  and select the time and place that  best fits your schedule

## 2024-02-26 NOTE — Telephone Encounter (Signed)
 Please let referrals know I ordered stat CT chest for anywhere in [place]   I tried to send to the [id number] guil referral team but it says I am not signed into their pool so I cannot route  Thanks

## 2024-02-26 NOTE — Assessment & Plan Note (Signed)
 Declines treatment Open to another dexa Ordered for July  Discussed fall prevention, supplements and exercise for bone density  No falls or fractures Is smoker former/ e cig now

## 2024-02-27 ENCOUNTER — Ambulatory Visit
Admission: RE | Admit: 2024-02-27 | Discharge: 2024-02-27 | Disposition: A | Discharge status: Home / Self Care | Source: Ambulatory Visit | Attending: Family Medicine | Admitting: Family Medicine

## 2024-02-27 ENCOUNTER — Encounter: Payer: Self-pay | Admitting: Family Medicine

## 2024-02-27 DIAGNOSIS — R928 Other abnormal and inconclusive findings on diagnostic imaging of breast: Secondary | ICD-10-CM | POA: Diagnosis not present

## 2024-02-27 DIAGNOSIS — R92321 Mammographic fibroglandular density, right breast: Secondary | ICD-10-CM | POA: Diagnosis not present

## 2024-02-27 NOTE — Telephone Encounter (Signed)
 Imaging center has already tried to call pt once, they are aware of the referral

## 2024-02-28 ENCOUNTER — Ambulatory Visit
Admission: RE | Admit: 2024-02-28 | Discharge: 2024-02-28 | Disposition: A | Discharge status: Home / Self Care | Source: Ambulatory Visit | Attending: Family Medicine | Admitting: Family Medicine

## 2024-02-28 ENCOUNTER — Encounter: Payer: Self-pay | Admitting: Family Medicine

## 2024-02-28 DIAGNOSIS — R9389 Abnormal findings on diagnostic imaging of other specified body structures: Secondary | ICD-10-CM

## 2024-02-28 DIAGNOSIS — R918 Other nonspecific abnormal finding of lung field: Secondary | ICD-10-CM | POA: Diagnosis not present

## 2024-02-28 DIAGNOSIS — I700 Atherosclerosis of aorta: Secondary | ICD-10-CM | POA: Diagnosis not present

## 2024-02-28 DIAGNOSIS — R911 Solitary pulmonary nodule: Secondary | ICD-10-CM

## 2024-03-16 ENCOUNTER — Telehealth: Payer: Self-pay | Admitting: Radiation Oncology

## 2024-03-16 NOTE — Telephone Encounter (Signed)
 Patient left a voicemail to cancel her appointment with Dr. Rushie Chestnut. States her Mammo came back fine she remembers him saying if her mammo came back clear she would be released. Please call her if questions.

## 2024-03-18 ENCOUNTER — Encounter: Payer: Self-pay | Admitting: Family Medicine

## 2024-03-18 ENCOUNTER — Ambulatory Visit (INDEPENDENT_AMBULATORY_CARE_PROVIDER_SITE_OTHER): Admitting: Family Medicine

## 2024-03-18 ENCOUNTER — Other Ambulatory Visit: Payer: Self-pay | Admitting: Family Medicine

## 2024-03-18 VITALS — BP 122/70 | HR 67 | Temp 98.1°F | Ht 64.5 in | Wt 183.2 lb

## 2024-03-18 DIAGNOSIS — Z87891 Personal history of nicotine dependence: Secondary | ICD-10-CM

## 2024-03-18 DIAGNOSIS — F419 Anxiety disorder, unspecified: Secondary | ICD-10-CM

## 2024-03-18 DIAGNOSIS — I700 Atherosclerosis of aorta: Secondary | ICD-10-CM | POA: Insufficient documentation

## 2024-03-18 DIAGNOSIS — J42 Unspecified chronic bronchitis: Secondary | ICD-10-CM | POA: Diagnosis not present

## 2024-03-18 DIAGNOSIS — I2510 Atherosclerotic heart disease of native coronary artery without angina pectoris: Secondary | ICD-10-CM | POA: Diagnosis not present

## 2024-03-18 DIAGNOSIS — J449 Chronic obstructive pulmonary disease, unspecified: Secondary | ICD-10-CM | POA: Insufficient documentation

## 2024-03-18 DIAGNOSIS — I10 Essential (primary) hypertension: Secondary | ICD-10-CM | POA: Diagnosis not present

## 2024-03-18 DIAGNOSIS — J209 Acute bronchitis, unspecified: Secondary | ICD-10-CM | POA: Diagnosis not present

## 2024-03-18 DIAGNOSIS — E7800 Pure hypercholesterolemia, unspecified: Secondary | ICD-10-CM

## 2024-03-18 DIAGNOSIS — I70219 Atherosclerosis of native arteries of extremities with intermittent claudication, unspecified extremity: Secondary | ICD-10-CM | POA: Insufficient documentation

## 2024-03-18 MED ORDER — BUDESONIDE-FORMOTEROL FUMARATE 160-4.5 MCG/ACT IN AERO: 2.0000 | INHALATION_SPRAY | Freq: Two times a day (BID) | RESPIRATORY_TRACT | 3 refills | Status: DC

## 2024-03-18 NOTE — Assessment & Plan Note (Signed)
 Noted incidentally on her CT chest  Has high cholesterol and is intol of any statin or zetia so far No symptoms  Continues to vape nicotine   Ref to cardiology

## 2024-03-18 NOTE — Assessment & Plan Note (Signed)
 Currently vapes 6 mg nicotine 2-3 puffs tid  Disc in detail risks of smoking and possible outcomes including copd, vascular/ heart disease, cancer , respiratory and sinus infections as well as osteoporosis  Pt voices understanding  She is considering quitting/ not ready today   Has copd symptoms

## 2024-03-18 NOTE — Assessment & Plan Note (Addendum)
 Strongly suspect this given persistence of wheezing/tight chest even when not infected in a former smoker who still vapes Is using albuterol atc not  Prednisone helped temporarily    Has not had PFTs yet   I suspect an inhaled steroid/bronchodialator would help a lot on a schedule  Sent symbicort to pharmacy to try if covered and follow up 1 mo later  Encouraged strongly to quit vaping  Meds ordered this encounter  Medications   budesonide-formoterol (SYMBICORT) 160-4.5 MCG/ACT inhaler    Sig: Inhale 2 puffs into the lungs 2 (two) times daily.    Dispense:  1 each    Refill:  3

## 2024-03-18 NOTE — Assessment & Plan Note (Signed)
 While this is improved -still dealing with wheezing/tight chest and clear phlegm  Suspect this is chronic bronchitis/copd =worse post viral Encouraged strongly to consider quitting vaping   Would like to try inhaled steroid/long acting bronchodilator if covered Sent symbicort to pharm Meds ordered this encounter  Medications   budesonide-formoterol (SYMBICORT) 160-4.5 MCG/ACT inhaler    Sig: Inhale 2 puffs into the lungs 2 (two) times daily.    Dispense:  1 each    Refill:  3    Follow up in a month  Consider prednisone if needed Consider PFT in future

## 2024-03-18 NOTE — Patient Instructions (Addendum)
 I want to try a maintenance inhaler  Sent in symbicort to use 2 puffs twice daily (on a schedule)  Let's see if this is covered or something similar   Use albuterol as needed as well   Once you start the maintenance inhaler - follow up with me about a month later   Call if symptoms worsen    I put the referral in for cardiology  Please let us know if you don't hear in 1-2 weeks   Check out meditation app - Calm  Try some of the programs and give it time

## 2024-03-18 NOTE — Telephone Encounter (Signed)
 See message regarding symbicort Rx. It says:   Pharmacy comment: Alternative Requested:THE PRESCRIBED MEDICATION IS NOT COVERED BY INSURANCE. PLEASE CONSIDER CHANGING TO ONE OF THE SUGGESTED COVERED ALTERNATIVES.    Alt med is the [employer] that's pending they sent

## 2024-03-18 NOTE — Progress Notes (Signed)
 Subjective:    Patient ID: Erin Hinton, female    DOB: 28-Aug-1946, 78 y.o.   MRN: [id number]  HPI  Wt Readings from Last 3 Encounters:  03/18/24 183 lb 4 oz (83.1 kg)  02/26/24 184 lb 4 oz (83.6 kg)  02/10/24 176 lb (79.8 kg)   30.97 kg/m  Vitals:   03/18/24 1034  BP: 122/70  Pulse: 67  Temp: 98.1 F (36.7 C)  SpO2: 93%   Pt presents for follow up of HTN Also sleeping problems Re check lungs , bronchitis  Hyperlipidemia    HTN bp is stable today  No cp or palpitations or headaches or edema  No side effects to medicines  BP Readings from Last 3 Encounters:  03/18/24 122/70  02/26/24 (!) 144/90  06/13/23 (!) 150/80    Continues hydrochlorothiazide 25 mg daily   Lab Results  Component Value Date   NA 140 02/26/2024   K 4.1 02/26/2024   CO2 32 02/26/2024   GLUCOSE 98 02/26/2024   BUN 15 02/26/2024   CREATININE 0.76 02/26/2024   CALCIUM 10.2 02/26/2024   GFR 76.57 02/26/2024   GFRNONAA 88.56 11/28/2010     Sleep problems Trouble going to sleep more so than staying asleep  Has history of anxiety  Takes lexapro 20 mg daily  -takes in the evening   Usually goes to bed 11 pm-- can take up to three hours to go to sleep  (she gets up and does something and tries not to get on a screen)  Tries to read a book but vision has been a problem lately  Usually gets up 8:30 to 9:30   Thinks anxiety plays a role  Has never done talk therapy  Has never done meditation      Bronchitis Last visit 3/12 Acute bronchitis     Reviewed UC notes from late January-was treatment with augmentin and albuterol mdi  (pt declined prednisone) Still some wheezing and shortness of breath No cough  No formal dx of copd in past    Reassuring exam Cxr today  Prescription prednisone 30 mg taper (aware of side effects) Plan follow up 2-3 wk    Strongly encouraged to consider cessation of e cig/ vaping     Cxr noted IMPRESSION: Possible left apical nodular density is  noted. CT scan is recommended for further evaluation.   CT IMPRESSION: 1. Bilateral cervical ribs left greater than right, likely accounting for the density noted on chest radiography. 2. No acute findings. 3. Coronary and aortic Atherosclerosis (ICD10-I70.0).  Smoking status  Vaping - 2-3 puffs , 3 times daily  6 mg nicotine  Has felt the need to putting them down   Struggling with pnd  Clear mucous  Some cough - loose and bringing up clear mucous/ overall much improved    Uses albuterol for wheeze/tight chest and shortness of breath  Inhaler-uses every 4 hours  Was better on the prednisone     Hyperlipidemia Lab Results  Component Value Date   CHOL 261 (H) 02/26/2024   CHOL 188 02/20/2023   CHOL 257 (H) 01/30/2022   Lab Results  Component Value Date   HDL 74.10 02/26/2024   HDL 57.90 02/20/2023   HDL 74.30 01/30/2022   Lab Results  Component Value Date   LDLCALC 166 (H) 02/26/2024   LDLCALC 109 (H) 02/20/2023   LDLCALC 164 (H) 01/30/2022   Lab Results  Component Value Date   TRIG 106.0 02/26/2024   TRIG 102.0 02/20/2023  TRIG 92.0 01/30/2022   Lab Results  Component Value Date   CHOLHDL 4 02/26/2024   CHOLHDL 3 02/20/2023   CHOLHDL 3 01/30/2022   Lab Results  Component Value Date   LDLDIRECT 128.6 11/28/2010   LDLDIRECT 152.1 11/24/2009   LDLDIRECT 138.9 11/23/2008   Intol to any dose of statin or zetia  Going up   No red meat No fatty foods   Interested in cardiology visit    Patient Active Problem List   Diagnosis Date Noted   Coronary atherosclerosis 03/18/2024   Aortic atherosclerosis (HCC) 03/18/2024   COPD (chronic obstructive pulmonary disease) (HCC) 03/18/2024   Acute bronchitis 02/26/2024   Sleep disorder 02/26/2024   Electronic cigarette use 02/26/2024   Nodular radiologic density 02/26/2024   Neck mass 06/04/2023   History of breast cancer 12/13/2020   Vitamin D deficiency 09/19/2020   Mitral prolapse 02/11/2019    Abnormal EKG 02/10/2019   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   Family history of heart disease 07/15/2017   Prediabetes 07/15/2017   Anxiety 07/15/2017   Preoperative cardiovascular examination 04/07/2014   Colon cancer screening 03/09/2013   Routine general medical examination at a health care facility 01/30/2012   HYPERTENSION, BENIGN ESSENTIAL 02/01/2010   Osteoporosis 11/23/2008   Former smoker 10/21/2007   Hyperlipidemia 09/29/2007   Mitral valve disease 09/29/2007   FIBROCYSTIC BREAST DISEASE 09/29/2007   Allergy 09/29/2007   Past Medical History:  Diagnosis Date   Allergy    Anxiety    Breast cancer (HCC) 1996   Right breast, Dr Renaee Munda, DCIS, lumpectomy only   Cancer (HCC)    skin   Cataract    DCIS (ductal carcinoma in situ) 03/02/2019   Left, 11 mm high-grade DCIS, 10 mm margins. ER positive: 51-90%   Depression    Heart murmur    in her 30's and it is not a problem now   Hyperlipidemia    Hyperlipidemia    Hypertension    Osteopenia    Personal history of radiation therapy    Shortness of breath dyspnea    due to excercise only   Past Surgical History:  Procedure Laterality Date   BREAST BIOPSY Left 01/27/2019   Affirm Bx- Coil Clip- HIGH-GRADE DUCTAL CARCINOMA IN SITU, WITH COMEDONECROSIS.   BREAST EXCISIONAL BIOPSY Right 01/2002   neg. no lymph node involvement   BREAST EXCISIONAL BIOPSY Left 03/02/2019   lumpectomy DCIS   BREAST LUMPECTOMY Left 03/02/2019   left lumpectomy dcis with NL   BREAST LUMPECTOMY Left 03/02/2019   Procedure: BREAST WIDE EXCISION LEFT, WITH WIRE LOCATION;  Surgeon: Earline Mayotte, MD;  Location: [facility];  Service: General;  Laterality: Left;   BREAST SURGERY  1998   breast biopsy DCIS   CATARACT EXTRACTION W/PHACO Right 05/01/2016   Procedure: CATARACT EXTRACTION PHACO AND INTRAOCULAR LENS PLACEMENT (IOC);  Surgeon: Galen Manila, MD;  Location: [facility];  Service: Ophthalmology;  Laterality:  Right;  [place] 00:53 AP% 21.7 CDE 11.50 fluid pack lot # [id number]   CATARACT EXTRACTION W/PHACO Left 06/05/2016   Procedure: CATARACT EXTRACTION PHACO AND INTRAOCULAR LENS PLACEMENT (IOC);  Surgeon: Galen Manila, MD;  Location: [facility];  Service: Ophthalmology;  Laterality: Left;  [place] 01:04 AP% 22.2 CDE 14.34 fluid pack lot # [id number]   COLONOSCOPY WITH PROPOFOL N/A 11/21/2015   Procedure: COLONOSCOPY WITH PROPOFOL;  Surgeon: Wallace Cullens, MD;  Location: [facility];  Service: Gastroenterology;  Laterality: N/A;   EYE SURGERY  Cataract   SKIN BIOPSY  01/01/2023   Upper Back   Social History   Tobacco Use   Smoking status: Former    Current packs/day: 0.00    Average packs/day: 0.5 packs/day for 45.0 years (22.5 ttl pk-yrs)    Types: Cigarettes, E-cigarettes    Start date: 01/21/1971    Quit date: 01/22/2016    Years since quitting: 8.1    Passive exposure: Past   Smokeless tobacco: Never   Tobacco comments:    also uses e-cigaretts on occasion  Vaping Use   Vaping status: Some Days   Substances: Nicotine, Flavoring  Substance Use Topics   Alcohol use: No   Drug use: No   Family History  Problem Relation Age of Onset   Ovarian cancer Mother    Heart disease Sister    Lung cancer Sister    Lymphoma Father    Heart disease Father        MI   Hypertension Father    Cancer Father    Alcohol abuse Brother    Lung cancer Brother    Breast cancer Sister 24   Heart disease Sister    Heart attack Sister    Breast cancer Daughter 85       Loleta Dicker, LCIS   Breast cancer Paternal Aunt 43   Colon cancer Neg Hx    Allergies  Allergen Reactions   Alendronate Sodium     GI pain   Atorvastatin     Muscle pain   Crestor [Rosuvastatin Calcium]     Muscle pain   Evista [Raloxifene] Other (See Comments)    Breast tenderness   Rosuvastatin Other (See Comments)   Sertraline Hcl     Did not help, made anxiety worse   Pseudoephedrine Palpitations   Current Outpatient  Medications on File Prior to Visit  Medication Sig Dispense Refill   acetaminophen (TYLENOL) 500 MG tablet Take 500 mg by mouth every 6 (six) hours as needed (for pain.).     albuterol (VENTOLIN HFA) 108 (90 Base) MCG/ACT inhaler Inhale 2 puffs into the lungs every 4 (four) hours as needed for wheezing or shortness of breath. 18 g 3   Ascorbic Acid (VITAMIN C WITH ROSE HIPS) 1000 MG tablet Take 1,000 mg by mouth daily.     Calcium Carb-Cholecalciferol (CALCIUM 600 + D PO) Take 1 capsule by mouth daily.     Calcium-Magnesium-Zinc (CAL-MAG-ZINC PO) Take 1 tablet by mouth daily.     Cholecalciferol (VITAMIN D) 50 MCG (2000 UT) tablet Take 2,000 Units by mouth daily.     escitalopram (LEXAPRO) 20 MG tablet TAKE 1 TABLET BY MOUTH EVERY DAY 90 tablet 2   fluticasone (FLONASE) 50 MCG/ACT nasal spray Place 2 sprays into both nostrils daily as needed for allergies. 48 mL 0   hydrochlorothiazide (HYDRODIURIL) 25 MG tablet TAKE 1 TABLET BY MOUTH EVERY DAY 90 tablet 2   Omega-3 Fatty Acids (FISH OIL) 1000 MG CAPS Take 1,000 mg by mouth daily.     vitamin E 1000 UNIT capsule Take 1 capsule by mouth daily.     No current facility-administered medications on file prior to visit.    Review of Systems  Constitutional:  Positive for fatigue. Negative for activity change, appetite change, fever and unexpected weight change.  HENT:  Negative for congestion, ear pain, rhinorrhea, sinus pressure and sore throat.   Eyes:  Negative for pain, redness and visual disturbance.  Respiratory:  Positive for chest tightness, shortness of  breath and wheezing. Negative for cough, choking and stridor.   Cardiovascular:  Negative for chest pain and palpitations.  Gastrointestinal:  Negative for abdominal pain, blood in stool, constipation and diarrhea.  Endocrine: Negative for polydipsia and polyuria.  Genitourinary:  Negative for dysuria, frequency and urgency.  Musculoskeletal:  Negative for arthralgias, back pain and  myalgias.  Skin:  Negative for pallor and rash.  Allergic/Immunologic: Negative for environmental allergies.  Neurological:  Negative for dizziness, syncope and headaches.  Hematological:  Negative for adenopathy. Does not bruise/bleed easily.  Psychiatric/Behavioral:  Negative for decreased concentration and dysphoric mood. The patient is not nervous/anxious.        Objective:   Physical Exam Constitutional:      General: She is not in acute distress.    Appearance: Normal appearance. She is well-developed. She is obese. She is not ill-appearing or diaphoretic.  HENT:     Head: Normocephalic and atraumatic.     Mouth/Throat:     Mouth: Mucous membranes are moist.     Pharynx: Oropharynx is clear.     Comments: Clear pnd  Eyes:     Conjunctiva/sclera: Conjunctivae normal.     Pupils: Pupils are equal, round, and reactive to light.  Neck:     Thyroid: No thyromegaly.     Vascular: No carotid bruit or JVD.  Cardiovascular:     Rate and Rhythm: Normal rate and regular rhythm.     Heart sounds: Normal heart sounds.     No gallop.  Pulmonary:     Effort: Pulmonary effort is normal. No respiratory distress.     Breath sounds: No stridor. Wheezing present. No rhonchi or rales.     Comments: Wheeze on forced exp  No prolonged exp time  Upper airway sounds / clear with cough    Abdominal:     General: There is no distension or abdominal bruit.     Palpations: Abdomen is soft.  Musculoskeletal:     Cervical back: Normal range of motion and neck supple.     Right lower leg: No edema.     Left lower leg: No edema.  Lymphadenopathy:     Cervical: No cervical adenopathy.  Skin:    General: Skin is warm and dry.     Coloration: Skin is not pale.     Findings: No rash.  Neurological:     Mental Status: She is alert.     Cranial Nerves: No cranial nerve deficit.     Coordination: Coordination normal.     Deep Tendon Reflexes: Reflexes are normal and symmetric. Reflexes normal.   Psychiatric:        Mood and Affect: Mood normal.     Comments: Mood is good today Candidly discusses symptoms and stressors             Assessment & Plan:   Problem List Items Addressed This Visit       Cardiovascular and Mediastinum   HYPERTENSION, BENIGN ESSENTIAL   Blood pressure is improved this visit bp in fair control at this time  BP Readings from Last 1 Encounters:  03/18/24 122/70   No changes needed Most recent labs reviewed  Disc lifstyle change with low sodium diet and exercise  Continues hydrochlorothiazide 25 mg daily        Coronary atherosclerosis   Noted incidentally on her CT chest  Has high cholesterol and is intol of any statin or zetia so far No symptoms  Continues to vape nicotine  Ref to cardiology      Relevant Orders   Ambulatory referral to Cardiology   Aortic atherosclerosis (HCC)   Noted incidentally on her CT chest  Has high cholesterol and is intol of any statin or zetia so far No symptoms  Continues to vape nicotine   Ref to cardiology      Relevant Orders   Ambulatory referral to Cardiology     Respiratory   COPD (chronic obstructive pulmonary disease) (HCC)   Strongly suspect this given persistence of wheezing/tight chest even when not infected in a former smoker who still vapes Is using albuterol atc not  Prednisone helped temporarily    Has not had PFTs yet   I suspect an inhaled steroid/bronchodialator would help a lot on a schedule  Sent symbicort to pharmacy to try if covered and follow up 1 mo later  Encouraged strongly to quit vaping  Meds ordered this encounter  Medications   budesonide-formoterol (SYMBICORT) 160-4.5 MCG/ACT inhaler    Sig: Inhale 2 puffs into the lungs 2 (two) times daily.    Dispense:  1 each    Refill:  3         Relevant Medications   budesonide-formoterol (SYMBICORT) 160-4.5 MCG/ACT inhaler   Acute bronchitis - Primary   While this is improved -still dealing with  wheezing/tight chest and clear phlegm  Suspect this is chronic bronchitis/copd =worse post viral Encouraged strongly to consider quitting vaping   Would like to try inhaled steroid/long acting bronchodilator if covered Sent symbicort to pharm Meds ordered this encounter  Medications   budesonide-formoterol (SYMBICORT) 160-4.5 MCG/ACT inhaler    Sig: Inhale 2 puffs into the lungs 2 (two) times daily.    Dispense:  1 each    Refill:  3    Follow up in a month  Consider prednisone if needed Consider PFT in future           Other   Hyperlipidemia   Disc goals for lipids and reasons to control them Rev last labs with pt Rev low sat fat diet in detail Intol of statins and zetia   LDL 160s  Has aortic and coronary atherosclerosis on CT  Ref to cardiology for help  ? If pcyk9 would be affordable option       Relevant Orders   Ambulatory referral to Cardiology   Former smoker   Currently vapes 6 mg nicotine 2-3 puffs tid  Disc in detail risks of smoking and possible outcomes including copd, vascular/ heart disease, cancer , respiratory and sinus infections as well as osteoporosis  Pt voices understanding  She is considering quitting/ not ready today   Has copd symptoms       Anxiety   This is likely causing difficulty falling asleep Continues lexapro 20   Does not want medication for sleep  May consider mental health counseling in future Discussed option of meditation for sleep=encouraged to try app like calm and practice it  Will update if not improved Also discussed sleep hygiene and caffeine avoidance 9she is good)

## 2024-03-18 NOTE — Assessment & Plan Note (Addendum)
 This is likely causing difficulty falling asleep Continues lexapro 20   Does not want medication for sleep  May consider mental health counseling in future Discussed option of meditation for sleep=encouraged to try app like calm and practice it  Will update if not improved Also discussed sleep hygiene and caffeine avoidance 9she is good)

## 2024-03-18 NOTE — Assessment & Plan Note (Signed)
 Disc goals for lipids and reasons to control them Rev last labs with pt Rev low sat fat diet in detail Intol of statins and zetia   LDL 160s  Has aortic and coronary atherosclerosis on CT  Ref to cardiology for help  ? If pcyk9 would be affordable option

## 2024-03-18 NOTE — Assessment & Plan Note (Signed)
 Blood pressure is improved this visit bp in fair control at this time  BP Readings from Last 1 Encounters:  03/18/24 122/70   No changes needed Most recent labs reviewed  Disc lifstyle change with low sodium diet and exercise  Continues hydrochlorothiazide 25 mg daily

## 2024-03-25 ENCOUNTER — Ambulatory Visit: Payer: PPO | Admitting: Radiation Oncology

## 2024-03-27 ENCOUNTER — Other Ambulatory Visit (HOSPITAL_COMMUNITY): Payer: Self-pay

## 2024-03-27 ENCOUNTER — Telehealth: Payer: Self-pay | Admitting: Pharmacy Technician

## 2024-03-27 ENCOUNTER — Ambulatory Visit: Attending: Medical | Admitting: Medical

## 2024-03-27 ENCOUNTER — Encounter: Payer: Self-pay | Admitting: Medical

## 2024-03-27 VITALS — BP 130/77 | HR 63 | Ht 65.0 in | Wt 184.4 lb

## 2024-03-27 DIAGNOSIS — R252 Cramp and spasm: Secondary | ICD-10-CM | POA: Diagnosis not present

## 2024-03-27 DIAGNOSIS — I700 Atherosclerosis of aorta: Secondary | ICD-10-CM | POA: Diagnosis not present

## 2024-03-27 DIAGNOSIS — R0602 Shortness of breath: Secondary | ICD-10-CM

## 2024-03-27 DIAGNOSIS — E782 Mixed hyperlipidemia: Secondary | ICD-10-CM

## 2024-03-27 DIAGNOSIS — I2510 Atherosclerotic heart disease of native coronary artery without angina pectoris: Secondary | ICD-10-CM

## 2024-03-27 MED ORDER — REPATHA SURECLICK 140 MG/ML ~~LOC~~ SOAJ: 140.0000 mg | SUBCUTANEOUS | 3 refills | Status: DC

## 2024-03-27 MED ORDER — ASPIRIN 81 MG PO TBEC: 81.0000 mg | DELAYED_RELEASE_TABLET | Freq: Every day | ORAL | Status: AC

## 2024-03-27 NOTE — Telephone Encounter (Signed)
 Pharmacy Patient Advocate Encounter  Received notification from [facility] [employer] that Prior Authorization for repatha has been APPROVED from 03/27/24 to 09/23/24. Spoke to pharmacy to process.Copay is $117.50 for 90 days .    PA #/Case ID/Reference #: [id number]

## 2024-03-27 NOTE — Progress Notes (Signed)
  Cardiology Office Note:  .   Date:  03/27/2024  ID:  Erin Hinton, DOB 1946-03-28, MRN [id number] PCP: Judy Pimple, MD  [facility] Providers Cardiologist:  None {  History of Present Illness: .   Erin Hinton is a 78 y.o. female with a h/o smoking, HTN, breast cancer who is being seen for follow-up for SOB and leg cramps.   Echo in 2022 showed LVEF, no significant valvular abnormalities. She was last seen 2022 for pre-op reporting SOB.   Today, the patient reports shortness of breath. She was started on an inhaler and this helped. She also started getting leg cramps. She has been taking ibuprofen and heat. She denies chest pain. She denies lower leg edema, orthopnea, or pnd. No lightheadedness or dizziness. No palpitations or heart racing.   Studies Reviewed: Marland Kitchen   EKG Interpretation Date/Time:  Friday March 27 2024 09:32:33 EDT Ventricular Rate:  63 PR Interval:  128 QRS Duration:  74 QT Interval:  394 QTC Calculation: 403 R Axis:   70  Text Interpretation: Normal sinus rhythm Possible Left atrial enlargement Nonspecific ST abnormality When compared with ECG of 09-Feb-2019 12:09, T wave inversion no longer evident in Inferior leads Confirmed by Fransico Michael, Bayli Quesinberry ([id number]) on 03/27/2024 9:45:25 AM    Echo 2020 1. The left ventricle has a visually estimated ejection fraction of. The  cavity size was mildly dilated. Left ventricular diastolic parameters were  normal.   2. Inferior basal hypokinesis Overall EF preserved 55%.   3. The right ventricle has normal systolic function. The cavity was  normal. There is no increase in right ventricular wall thickness.   4. The mitral valve is normal in structure. Mild thickening of the mitral  valve leaflet.   5. The tricuspid valve is normal in structure.   6. The aortic valve is tricuspid Mild thickening of the aortic valve Mild  calcification of the aortic valve.   7. The pulmonic valve was normal in structure.   8. The  aortic root is normal in size and structure.        Physical Exam:   VS:  BP 130/77 (BP Location: Left Arm, Patient Position: Sitting)   Pulse 63   Ht 5\' 5"  (1.651 m)   Wt 184 lb 6.4 oz (83.6 kg)   SpO2 97%   BMI 30.69 kg/m    Wt Readings from Last 3 Encounters:  03/27/24 184 lb 6.4 oz (83.6 kg)  03/18/24 183 lb 4 oz (83.1 kg)  02/26/24 184 lb 4 oz (83.6 kg)    GEN: Well nourished, well developed in no acute distress NECK: No JVD; No carotid bruits CARDIAC: RRR, no murmurs, rubs, gallops RESPIRATORY:  Clear to auscultation without rales, wheezing or rhonchi  ABDOMEN: Soft, non-tender, non-distended EXTREMITIES:  No edema; No deformity   ASSESSMENT AND PLAN: .    Leg cramps Patient reports leg cramps for the last month or so. No symptoms at rest. Pulse distant on exam. I will order ABIs. Chest CT shows coronary and aortic calcifications. I will start ASA 81mg  daily and Repatha. Recent labs stable.  SOB Seems to be better with Advair. I will update an echocardiogram. She is euvolemic on exam.   HLD LDL 166, total chol 261, TG 106, HDL 74. She is intolerant to statins.  I will start repatha.       Dispo: Follow-up in 1 month  Signed, Riyanna Crutchley David Stall, PA-C

## 2024-03-27 NOTE — Patient Instructions (Signed)
 Medication Instructions:  Your physician recommends the following medication changes.  START TAKING: Repatha 140mg /ml: inject into the skin every 14 days Aspirin 81 mg by mouth daily  *If you need a refill on your cardiac medications before your next appointment, please call your pharmacy*  Lab Work: No labs ordered today   Testing/Procedures: Your physician has requested that you have an echocardiogram. Echocardiography is a painless test that uses sound waves to create images of your heart. It provides your doctor with information about the size and shape of your heart and how well your heart's chambers and valves are working.   You may receive an ultrasound enhancing agent through an IV if needed to better visualize your heart during the echo. This procedure takes approximately one hour.  There are no restrictions for this procedure.  This will take place at [address] [facility] [address]) [address], [place] [address]  Please note: We ask at that you not bring children with you during ultrasound (echo/ vascular) testing. Due to room size and safety concerns, children are not allowed in the ultrasound rooms during exams. Our front office staff cannot provide observation of children in our lobby area while testing is being conducted. An adult accompanying a patient to their appointment will only be allowed in the ultrasound room at the discretion of the ultrasound technician under special circumstances. We apologize for any inconvenience.   Your physician has requested that you have an ankle brachial index (ABI). During this test an ultrasound and blood pressure cuff are used to evaluate the arteries that supply the arms and legs with blood.  Allow thirty minutes for this exam.  There are no restrictions or special instructions.  This will take place at [address] [facility] [address]) [address], [place] [address]  Please note: We ask at that you not bring children with you  during ultrasound (echo/ vascular) testing. Due to room size and safety concerns, children are not allowed in the ultrasound rooms during exams. Our front office staff cannot provide observation of children in our lobby area while testing is being conducted. An adult accompanying a patient to their appointment will only be allowed in the ultrasound room at the discretion of the ultrasound technician under special circumstances. We apologize for any inconvenience.   Follow-Up: At [facility], you and your health needs are our priority.  As part of our continuing mission to provide you with exceptional heart care, our providers are all part of one team.  This team includes your primary Cardiologist (physician) and Advanced Practice Providers or APPs (Physician Assistants and Nurse Practitioners) who all work together to provide you with the care you need, when you need it.  Your next appointment:   1 month(s)  Provider:   Cadence Fransico Michael, PA-C  We recommend signing up for the patient portal called "MyChart".  Sign up information is provided on this After Visit Summary.  MyChart is used to connect with patients for Virtual Visits (Telemedicine).  Patients are able to view lab/test results, encounter notes, upcoming appointments, etc.  Non-urgent messages can be sent to your provider as well.   To learn more about what you can do with MyChart, go to [email or web address].

## 2024-04-06 DIAGNOSIS — L905 Scar conditions and fibrosis of skin: Secondary | ICD-10-CM | POA: Diagnosis not present

## 2024-04-06 DIAGNOSIS — C44719 Basal cell carcinoma of skin of left lower limb, including hip: Secondary | ICD-10-CM | POA: Diagnosis not present

## 2024-04-13 ENCOUNTER — Ambulatory Visit: Attending: Medical | Admitting: Medical

## 2024-04-13 ENCOUNTER — Encounter: Payer: Self-pay | Admitting: Medical

## 2024-04-13 VITALS — BP 150/76 | HR 75 | Ht 65.0 in | Wt 186.4 lb

## 2024-04-13 DIAGNOSIS — R0602 Shortness of breath: Secondary | ICD-10-CM

## 2024-04-13 DIAGNOSIS — M79605 Pain in left leg: Secondary | ICD-10-CM | POA: Diagnosis not present

## 2024-04-13 DIAGNOSIS — E782 Mixed hyperlipidemia: Secondary | ICD-10-CM

## 2024-04-13 DIAGNOSIS — R252 Cramp and spasm: Secondary | ICD-10-CM | POA: Diagnosis not present

## 2024-04-13 DIAGNOSIS — M545 Low back pain, unspecified: Secondary | ICD-10-CM

## 2024-04-13 DIAGNOSIS — M79604 Pain in right leg: Secondary | ICD-10-CM

## 2024-04-13 NOTE — Progress Notes (Signed)
  Cardiology Office Note:  .   Date:  04/13/2024  ID:  Erin Hinton, DOB 1946-11-29, MRN [id number] PCP: Clemens Curt, MD  [facility] Providers Cardiologist:  None     History of Present Illness: .   Erin Hinton is a 78 y.o. female  with a h/o smoking, HTN, breast cancer who is being seen for follow-up for SOB and leg pain/cramps.    Echo in 2022 showed LVEF, no significant valvular abnormalities.   The patient was last seen 03/2024 reporting leg cramps. ABIs and echo were ordered.   Today, the patient reports back, buttox and leg pain in the back. She suspects BP is high due to pain. She is taking ibuprofen for the pain. She denies chest and and shortness of breath.    Studies Reviewed: .        Echo 2020 1. The left ventricle has a visually estimated ejection fraction of. The  cavity size was mildly dilated. Left ventricular diastolic parameters were  normal.   2. Inferior basal hypokinesis Overall EF preserved 55%.   3. The right ventricle has normal systolic function. The cavity was  normal. There is no increase in right ventricular wall thickness.   4. The mitral valve is normal in structure. Mild thickening of the mitral  valve leaflet.   5. The tricuspid valve is normal in structure.   6. The aortic valve is tricuspid Mild thickening of the aortic valve Mild  calcification of the aortic valve.   7. The pulmonic valve was normal in structure.   8. The aortic root is normal in size and structure.         Physical Exam:   VS:  BP (!) 150/76 (BP Location: Left Arm, Patient Position: Sitting, Cuff Size: Normal)   Pulse 75   Ht 5\' 5"  (1.651 m)   Wt 186 lb 6.4 oz (84.6 kg)   SpO2 95%   BMI 31.02 kg/m    Wt Readings from Last 3 Encounters:  04/13/24 186 lb 6.4 oz (84.6 kg)  03/27/24 184 lb 6.4 oz (83.6 kg)  03/18/24 183 lb 4 oz (83.1 kg)    GEN: Well nourished, well developed in no acute distress NECK: No JVD; No carotid bruits CARDIAC: RRR, no  murmurs, rubs, gallops RESPIRATORY:  Clear to auscultation without rales, wheezing or rhonchi  ABDOMEN: Soft, non-tender, non-distended EXTREMITIES:  No edema; No deformity   ASSESSMENT AND PLAN: .    Back pain Leg pain/cramps The patient reports pain in the back, buttox, and legs that is worse with standing. She describes it as a sharp shooting pain. Prior chest CT showed coronary and aortic calcifications. ABIs have been ordered. Continue ASA 81mg  daily and Repatha . I recommended she undergo ABI testing. I also recommended she see PCP to further investigate orthopedic reasons for pain.   SOB Improved with Advair. Echo has been ordered.   HLD LDL 166. Continue Repatha . Can repeat lipids at follow-up.     Dispo: follow-up in 4 months  Signed, Senay Sistrunk Rebekah Canada, PA-C

## 2024-04-13 NOTE — Patient Instructions (Signed)
 Medication Instructions:  Your Physician recommend you continue on your current medication as directed.    *If you need a refill on your cardiac medications before your next appointment, please call your pharmacy*  Lab Work: No labs ordered today  If you have labs (blood work) drawn today and your tests are completely normal, you will receive your results only by: MyChart Message (if you have MyChart) OR A paper copy in the mail If you have any lab test that is abnormal or we need to change your treatment, we will call you to review the results.  Testing/Procedures: No test ordered today   Follow-Up: At [facility], you and your health needs are our priority.  As part of our continuing mission to provide you with exceptional heart care, our providers are all part of one team.  This team includes your primary Cardiologist (physician) and Advanced Practice Providers or APPs (Physician Assistants and Nurse Practitioners) who all work together to provide you with the care you need, when you need it.  Your next appointment:   4 month(s)  Provider:   Toribio Frees, PA-C

## 2024-04-20 DIAGNOSIS — D0462 Carcinoma in situ of skin of left upper limb, including shoulder: Secondary | ICD-10-CM | POA: Diagnosis not present

## 2024-04-24 ENCOUNTER — Ambulatory Visit: Attending: Medical

## 2024-04-24 ENCOUNTER — Ambulatory Visit (INDEPENDENT_AMBULATORY_CARE_PROVIDER_SITE_OTHER)

## 2024-04-24 DIAGNOSIS — R252 Cramp and spasm: Secondary | ICD-10-CM | POA: Diagnosis not present

## 2024-04-24 DIAGNOSIS — R0602 Shortness of breath: Secondary | ICD-10-CM | POA: Diagnosis not present

## 2024-04-24 LAB — ECHOCARDIOGRAM COMPLETE
AR max vel: 2.83 cm2
AV Area VTI: 2.62 cm2
AV Area mean vel: 2.77 cm2
AV Mean grad: 4 mmHg
AV Peak grad: 7.5 mmHg
Ao pk vel: 1.37 m/s
Area-P 1/2: 3.53 cm2
S' Lateral: 3.23 cm
Single Plane A4C EF: 50.2 %

## 2024-04-26 LAB — VAS US ABI WITH/WO TBI
Left ABI: 1.01
Right ABI: 0.77

## 2024-04-28 ENCOUNTER — Ambulatory Visit: Payer: Self-pay

## 2024-04-29 ENCOUNTER — Other Ambulatory Visit: Payer: Self-pay

## 2024-04-29 ENCOUNTER — Telehealth: Payer: Self-pay | Admitting: Cardiovascular Disease

## 2024-04-29 DIAGNOSIS — M545 Low back pain, unspecified: Secondary | ICD-10-CM

## 2024-04-29 DIAGNOSIS — I700 Atherosclerosis of aorta: Secondary | ICD-10-CM

## 2024-04-29 DIAGNOSIS — M79604 Pain in right leg: Secondary | ICD-10-CM

## 2024-04-29 NOTE — Telephone Encounter (Signed)
 Called patient, advised of abnormal ABI- needing LEA and CT ABDOMEN PELVIS ordered.   Ordered scans below, also ordered BMET in case needed for CT- last labs 02/2024.  Patient verbalized understanding, aware schedulers will call to get scheduled.

## 2024-04-29 NOTE — Telephone Encounter (Signed)
 Patient returned RN's call regarding results.

## 2024-05-01 ENCOUNTER — Ambulatory Visit: Admitting: Medical

## 2024-05-05 ENCOUNTER — Other Ambulatory Visit: Payer: Self-pay | Admitting: Family Medicine

## 2024-05-07 NOTE — Telephone Encounter (Signed)
 Spoke with scheduling and scheduled CT ANGIO ABDOMENT/PELVIS, they state patient needs labwork before this test. Please address. Thank you

## 2024-05-11 ENCOUNTER — Other Ambulatory Visit: Payer: Self-pay | Admitting: Family Medicine

## 2024-05-13 ENCOUNTER — Ambulatory Visit: Attending: Medical

## 2024-05-13 ENCOUNTER — Other Ambulatory Visit: Payer: Self-pay | Admitting: *Deleted

## 2024-05-13 DIAGNOSIS — I700 Atherosclerosis of aorta: Secondary | ICD-10-CM | POA: Diagnosis not present

## 2024-05-13 DIAGNOSIS — M545 Low back pain, unspecified: Secondary | ICD-10-CM

## 2024-05-13 DIAGNOSIS — M79604 Pain in right leg: Secondary | ICD-10-CM | POA: Diagnosis not present

## 2024-05-13 DIAGNOSIS — M79605 Pain in left leg: Secondary | ICD-10-CM

## 2024-05-13 NOTE — Telephone Encounter (Signed)
 Ariza, Pilar V  You; Lorriane Rote, LPN1 hour ago (9:39 AM)   PA We had an opening patient coming in today 11am

## 2024-05-14 ENCOUNTER — Ambulatory Visit: Payer: Self-pay | Admitting: Medical

## 2024-05-14 DIAGNOSIS — M79604 Pain in right leg: Secondary | ICD-10-CM

## 2024-05-14 DIAGNOSIS — I774 Celiac artery compression syndrome: Secondary | ICD-10-CM

## 2024-05-14 LAB — BASIC METABOLIC PANEL WITH GFR
BUN/Creatinine Ratio: 19 (ref 12–28)
BUN: 15 mg/dL (ref 8–27)
CO2: 24 mmol/L (ref 20–29)
Calcium: 10.3 mg/dL (ref 8.7–10.3)
Chloride: 103 mmol/L (ref 96–106)
Creatinine, Ser: 0.8 mg/dL (ref 0.57–1.00)
Glucose: 99 mg/dL (ref 70–99)
Potassium: 4.3 mmol/L (ref 3.5–5.2)
Sodium: 144 mmol/L (ref 134–144)
eGFR: 77 mL/min/{1.73_m2} (ref >59)

## 2024-05-19 ENCOUNTER — Ambulatory Visit
Admission: RE | Admit: 2024-05-19 | Discharge: 2024-05-19 | Disposition: A | Discharge status: Home / Self Care | Source: Ambulatory Visit | Attending: Medical | Admitting: Medical

## 2024-05-19 DIAGNOSIS — M79606 Pain in leg, unspecified: Secondary | ICD-10-CM | POA: Diagnosis not present

## 2024-05-19 DIAGNOSIS — I70201 Unspecified atherosclerosis of native arteries of extremities, right leg: Secondary | ICD-10-CM | POA: Diagnosis not present

## 2024-05-19 DIAGNOSIS — M79604 Pain in right leg: Secondary | ICD-10-CM | POA: Diagnosis not present

## 2024-05-19 DIAGNOSIS — M545 Low back pain, unspecified: Secondary | ICD-10-CM | POA: Insufficient documentation

## 2024-05-19 DIAGNOSIS — K5730 Diverticulosis of large intestine without perforation or abscess without bleeding: Secondary | ICD-10-CM | POA: Diagnosis not present

## 2024-05-19 DIAGNOSIS — N281 Cyst of kidney, acquired: Secondary | ICD-10-CM | POA: Diagnosis not present

## 2024-05-19 DIAGNOSIS — I700 Atherosclerosis of aorta: Secondary | ICD-10-CM | POA: Insufficient documentation

## 2024-05-19 DIAGNOSIS — M79605 Pain in left leg: Secondary | ICD-10-CM | POA: Insufficient documentation

## 2024-05-19 MED ORDER — IOHEXOL 350 MG/ML SOLN
100.0000 mL | Freq: Once | INTRAVENOUS | Status: AC | PRN
  Administered 2024-05-19: 100 mL via INTRAVENOUS

## 2024-06-10 DIAGNOSIS — I708 Atherosclerosis of other arteries: Secondary | ICD-10-CM | POA: Insufficient documentation

## 2024-06-10 NOTE — Progress Notes (Signed)
 MRN : [id number]  Erin Hinton is a 78 y.o. ([birth year]) female who presents with chief complaint of check circulation.  History of Present Illness:   The patient is seen for evaluation of painful lower extremities and diminished pulses.  This was a finding based on an abnormal ABI.  Subsequently a CT of the abdomen and pelvis was obtained which demonstrates a high-grade stenosis in the right external iliac artery.  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  Patient notes the pain is always associated with activity and is very consistent day today. Typically, the pain occurs at less than one block, progress is as activity continues to the point that the patient must stop walking. Resting including standing still for several minutes allows the patient to walk a similar distance before being forced to stop again. Uneven terrain and inclines shorten the distance. The pain has been progressive over the past several years. The patient denies any abrupt changes in claudication symptoms.  The patient states the inability to walk is causing problems with daily activities.  The patient is describing some rest pain but no dangling of an extremity off the side of the bed during the night for relief. No open wounds or sores at this time. No prior interventions or surgeries.  No history of back problems or DJD of the lumbar sacral spine.   The patient's blood pressure has been stable and relatively well controlled. The patient denies amaurosis fugax or recent TIA symptoms. There are no recent neurological changes noted. The patient denies history of DVT, PE or superficial thrombophlebitis. The patient denies recent episodes of angina or shortness of breath.    CT scan dated May 19, 2024 is reviewed by me personally.  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  This is a long segment occlusion.   SMA is quite prominent and well collateralized.  IMA is patent as well.  Additionally there is a 75 to 80% stenosis of the right external iliac artery.  No outpatient medications have been marked as taking for the 06/11/24 encounter (Appointment) with Jama, Cordella MATSU, MD.    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 ER positive: 51-90%   Depression    Heart murmur    in her 30's and it is not a problem now   Hyperlipidemia    Hyperlipidemia    Hypertension    Osteopenia    Personal history of radiation therapy    Shortness of breath dyspnea    due to excercise only    Past Surgical History:  Procedure Laterality Date   BREAST BIOPSY Left 01/27/2019   Affirm Bx- Coil Clip- HIGH-GRADE DUCTAL CARCINOMA IN SITU, WITH COMEDONECROSIS.   BREAST EXCISIONAL BIOPSY Right 01/2002   neg. no lymph node involvement  BREAST EXCISIONAL BIOPSY Left 03/02/2019   lumpectomy DCIS   BREAST LUMPECTOMY Left 03/02/2019   left lumpectomy dcis with NL   BREAST LUMPECTOMY Left 03/02/2019   Procedure: BREAST WIDE EXCISION LEFT, WITH WIRE LOCATION;  Surgeon: Dessa Reyes ORN, MD;  Location: [facility] ORS;  Service: General;  Laterality: Left;   BREAST SURGERY  1998   breast biopsy DCIS   CATARACT EXTRACTION W/PHACO Right 05/01/2016   Procedure: CATARACT EXTRACTION PHACO AND INTRAOCULAR LENS PLACEMENT (IOC);  Surgeon: Elsie Carmine, MD;  Location: [facility] ORS;  Service: Ophthalmology;  Laterality: Right;  US  00:53 AP% 21.7 CDE 11.50 fluid pack lot # [id number]   CATARACT EXTRACTION W/PHACO Left 06/05/2016   Procedure: CATARACT EXTRACTION PHACO AND INTRAOCULAR LENS PLACEMENT (IOC);  Surgeon: Elsie Carmine, MD;  Location: [facility] ORS;  Service: Ophthalmology;  Laterality: Left;  US   01:04 AP% 22.2 CDE 14.34 fluid pack lot # [id number]   COLONOSCOPY WITH PROPOFOL  N/A 11/21/2015   Procedure: COLONOSCOPY WITH PROPOFOL ;  Surgeon: Deward CINDERELLA [place], MD;  Location: [facility];  Service: Gastroenterology;  Laterality: N/A;   EYE SURGERY     Cataract   SKIN BIOPSY  01/01/2023   Upper Back    Social History Social History   Tobacco Use   Smoking status: Former    Current packs/day: 0.00    Average packs/day: 0.5 packs/day for 45.0 years (22.5 ttl pk-yrs)    Types: Cigarettes, E-cigarettes    Start date: 01/21/1971    Quit date: 01/22/2016    Years since quitting: 8.3    Passive exposure: Past   Smokeless tobacco: Never   Tobacco comments:    also uses e-cigaretts on occasion  Vaping Use   Vaping status: Some Days   Substances: Nicotine, Flavoring  Substance Use Topics   Alcohol use: No   Drug use: No    Family History Family History  Problem Relation Age of Onset   Ovarian cancer Mother    Heart disease Sister    Lung cancer Sister    Lymphoma Father    Heart disease Father        MI   Hypertension Father    Cancer Father    Alcohol abuse Brother    Lung cancer Brother    Breast cancer Sister 49   Heart disease Sister    Heart attack Sister    Breast cancer Daughter 27       Charleen Ee, LCIS   Breast cancer Paternal Aunt 3   Colon cancer Neg Hx     Allergies  Allergen Reactions   Alendronate Sodium     GI pain   Atorvastatin     Muscle pain   Crestor  [Rosuvastatin  Calcium ]     Muscle pain   Evista [Raloxifene] Other (See Comments)    Breast tenderness   Rosuvastatin  Other (See Comments)   Sertraline Hcl     Did not help, made anxiety worse   Pseudoephedrine Palpitations     REVIEW OF SYSTEMS (Negative unless checked)  Constitutional: [] Weight loss  [] Fever  [] Chills Cardiac: [] Chest pain   [] Chest pressure   [] Palpitations   [] Shortness of breath when laying flat   [] Shortness of breath with exertion. Vascular:  [x] Pain in legs with  walking   [] Pain in legs at rest  [] History of DVT   [] Phlebitis   [] Swelling in legs   [] Varicose veins   [] Non-healing ulcers Pulmonary:   [] Uses home oxygen   [] Productive cough   [] Hemoptysis   [] Wheeze  []   COPD   [] Asthma Neurologic:  [] Dizziness   [] Seizures   [] History of stroke   [] History of TIA  [] Aphasia   [] Vissual changes   [] Weakness or numbness in arm   [] Weakness or numbness in leg Musculoskeletal:   [] Joint swelling   [] Joint pain   [] Low back pain Hematologic:  [] Easy bruising  [] Easy bleeding   [] Hypercoagulable state   [] Anemic Gastrointestinal:  [] Diarrhea   [] Vomiting  [] Gastroesophageal reflux/heartburn   [] Difficulty swallowing. Genitourinary:  [] Chronic kidney disease   [] Difficult urination  [] Frequent urination   [] Blood in urine Skin:  [] Rashes   [] Ulcers  Psychological:  [] History of anxiety   []  History of major depression.  Physical Examination  There were no vitals filed for this visit. There is no height or weight on file to calculate BMI. Gen: WD/WN, NAD Head: [place]/AT, No temporalis wasting.  Ear/Nose/Throat: Hearing grossly intact, nares w/o erythema or drainage Eyes: PER, EOMI, sclera nonicteric.  Neck: Supple, no masses.  No bruit or JVD.  Pulmonary:  Good air movement, no audible wheezing, no use of accessory muscles.  Cardiac: RRR, normal S1, S2, no Murmurs. Vascular:  mild trophic changes, no open wounds Vessel Right Left  Radial Palpable Palpable  PT Not Palpable Not Palpable  DP Not Palpable Not Palpable  Gastrointestinal: soft, non-distended. No guarding/no peritoneal signs.  Musculoskeletal: M/S 5/5 throughout.  No visible deformity.  Neurologic: CN 2-12 intact. Pain and light touch intact in extremities.  Symmetrical.  Speech is fluent. Motor exam as listed above. Psychiatric: Judgment intact, Mood & affect appropriate for pt's clinical situation. Dermatologic: No rashes or ulcers noted.  No changes consistent with cellulitis.   CBC Lab  Results  Component Value Date   WBC 6.1 02/26/2024   HGB 14.0 02/26/2024   HCT 41.9 02/26/2024   MCV 99.3 02/26/2024   PLT 232.0 02/26/2024    BMET    Component Value Date/Time   NA 144 05/13/2024 1046   K 4.3 05/13/2024 1046   CL 103 05/13/2024 1046   CO2 24 05/13/2024 1046   GLUCOSE 99 05/13/2024 1046   GLUCOSE 98 02/26/2024 1121   BUN 15 05/13/2024 1046   CREATININE 0.80 05/13/2024 1046   CALCIUM  10.3 05/13/2024 1046   GFRNONAA 88.56 11/28/2010 0821   GFRAA 94 11/23/2008 0841   CrCl cannot be calculated (Patient's most recent lab result is older than the maximum 21 days allowed.).  COAG No results found for: INR, PROTIME  Radiology CT ANGIO ABDOMEN PELVIS  W & WO CONTRAST Result Date: 05/19/2024 EXAMINATION: CT ANGIO ABDOMEN PELVIS  W & WO CONTRAST CLINICAL INDICATION: Female, 78 years old. abnormal ABI, leg pain TECHNIQUE: Axial CTA of the abdomen and pelvis with and without 100 cc Omnipaque  300 intravenous contrast. Multiplanar and 3D reformations provided. Unless otherwise specified, incidental thyroid , adrenal, renal lesions do not require dedicated imaging follow up. Additionally, any mentioned pulmonary nodules do not require dedicated imaging follow-up based on the Fleischner guidelines unless otherwise specified. Coronary calcifications are not identified unless otherwise specified. COMPARISON: FINDINGS: The abdominal aorta is normal in caliber. Scattered atherosclerotic changes are present. The celiac artery origin is occluded The SMA is patent. The renal arteries are patent. The IMA is patent. The common iliac arteries, internal and external iliac arteries appear patent other than approximate 75% stenosis of the distal right external iliac artery. The common femoral arteries and visualized portions of the superficial and deep femoral arteries are patent. There are subsegmental atelectatic changes in the  lung bases. The heart is normal in size. The liver appears normal.  The gallbladder is normal. The spleen is normal. Pancreas appears normal. The adrenals are normal. Bilateral renal cysts are seen. Urinary bladder is normal. The uterus appears normal. There is colonic diverticulosis. Appendix is normal. Large and small bowel loops are otherwise within normal limits. No free fluid or adenopathy. There are degenerative changes of the spine and bony pelvis. IMPRESSION: Diffuse atherosclerotic changes with occlusion of the celiac artery and approximately 75% stenosis of the right external iliac artery. Nonvascular findings as above. DOSE REDUCTION: This exam was performed according to our departmental dose-optimization program which includes automated exposure control, adjustment of the mA and/or kV according to patient size and/or use of iterative reconstruction technique. Electronically signed by: [place] Engel MD 05/19/2024 05:26 PM EDT RP Workstation: [id number]   VAS US  LOWER EXTREMITY ARTERIAL DUPLEX Result Date: 05/13/2024 LOWER EXTREMITY ARTERIAL DUPLEX STUDY Patient Name:  MAYCIE LUERA  Date of Exam:   05/13/2024 Medical Rec #: [id number]           Accession #:    [id number] Date of Birth: 10/18/1946           Patient Gender: F Patient Age:   23 years Exam Location:  [place] Procedure:      VAS US  LOWER EXTREMITY ARTERIAL DUPLEX Referring Phys: CADENCE FURTH --------------------------------------------------------------------------------  Indications: Peripheral artery disease, and Abnormal ABI's on 04/24/24. High Risk Factors: Hypertension, hyperlipidemia, past history of smoking. Other Factors: Patient comolains of bilateral hip and leg pain with ambulation                and at rest.  Current ABI: Right-0.77, Left 1.01 Performing Technologist: Alan Greenhouse RDMS, RVT, RDCS  Examination Guidelines: A complete evaluation includes B-mode imaging, spectral Doppler, color Doppler, and power Doppler as needed of all accessible portions of each vessel. Bilateral testing is  considered an integral part of a complete examination. Limited examinations for reoccurring indications may be performed as noted.  +-----------+--------+-----+---------------+--------+--------+ RIGHT      PSV cm/sRatioStenosis       WaveformComments +-----------+--------+-----+---------------+--------+--------+ CFA Prox   237          50-74% stenosisbiphasic         +-----------+--------+-----+---------------+--------+--------+ CFA Distal 155                         biphasic         +-----------+--------+-----+---------------+--------+--------+ DFA        139                         biphasic         +-----------+--------+-----+---------------+--------+--------+ SFA Prox   128                         biphasic         +-----------+--------+-----+---------------+--------+--------+ SFA Mid    298          50-74% stenosisbiphasic         +-----------+--------+-----+---------------+--------+--------+ SFA Distal 78                          biphasic         +-----------+--------+-----+---------------+--------+--------+ POP Prox   52  biphasic         +-----------+--------+-----+---------------+--------+--------+ POP Distal 56                          biphasic         +-----------+--------+-----+---------------+--------+--------+ TP Trunk   63                          biphasic         +-----------+--------+-----+---------------+--------+--------+ ATA Prox   34                          biphasic         +-----------+--------+-----+---------------+--------+--------+ ATA Distal 43                          biphasic         +-----------+--------+-----+---------------+--------+--------+ PTA Prox   45                          biphasic         +-----------+--------+-----+---------------+--------+--------+ PTA Distal 33                          biphasic          +-----------+--------+-----+---------------+--------+--------+ PERO Prox  32                          biphasic         +-----------+--------+-----+---------------+--------+--------+ PERO Distal47                          biphasic         +-----------+--------+-----+---------------+--------+--------+ A focal velocity elevation of 298 cm/s was obtained at mid SFA with a VR of 2.3. Findings are characteristic of 50-74% stenosis.  +-----------+--------+-----+---------------+---------+--------+ LEFT       PSV cm/sRatioStenosis       Waveform Comments +-----------+--------+-----+---------------+---------+--------+ CFA Prox   171                         triphasic         +-----------+--------+-----+---------------+---------+--------+ CFA Distal 153                         triphasic         +-----------+--------+-----+---------------+---------+--------+ DFA        124                         biphasic          +-----------+--------+-----+---------------+---------+--------+ SFA Prox   121                         biphasic          +-----------+--------+-----+---------------+---------+--------+ SFA Mid    256          50-74% stenosisbiphasic          +-----------+--------+-----+---------------+---------+--------+ SFA Distal 121                                           +-----------+--------+-----+---------------+---------+--------+ POP Prox  92                          biphasic          +-----------+--------+-----+---------------+---------+--------+ POP Distal 54                          biphasic          +-----------+--------+-----+---------------+---------+--------+ TP Trunk   51                          biphasic          +-----------+--------+-----+---------------+---------+--------+ ATA Prox   38                          biphasic          +-----------+--------+-----+---------------+---------+--------+ ATA Distal 90                           biphasic          +-----------+--------+-----+---------------+---------+--------+ PTA Prox   48                          biphasic          +-----------+--------+-----+---------------+---------+--------+ PTA Distal 22                          biphasic          +-----------+--------+-----+---------------+---------+--------+ PERO Prox  29                          biphasic          +-----------+--------+-----+---------------+---------+--------+ PERO Distal65                          biphasic          +-----------+--------+-----+---------------+---------+--------+ A focal velocity elevation of 256 cm/s was obtained at mid SFA with a VR of 2.0. Findings are characteristic of 50-74% stenosis.  Summary: Right: 50-74% stenosis noted in the common femoral artery. 50-74% stenosis noted in the superficial femoral artery. Left: 50-74% stenosis noted in the superficial femoral artery.  See table(s) above for measurements and observations. Electronically signed by Maude Emmer MD on 05/13/2024 at 12:52:32 PM.    Final      Assessment/Plan 1. Atherosclerosis of native artery of right lower extremity with rest pain (HCC) Recommend:  The patient has evidence of severe atherosclerotic changes of both lower extremities with rest pain that is associated with preulcerative changes and impending tissue loss of the right foot.  This represents a limb threatening ischemia and places the patient at the risk for right limb loss.  Patient should undergo angiography of the right lower extremity with the hope for intervention for limb salvage.  The risks and benefits as well as the alternative therapies was discussed in detail with the patient.  All questions were answered.  Patient agrees to proceed with right lower extremity angiography.  The patient will follow up with me in the office after the procedure.   ICD Codes: I70.229    Atherosclerotic occlusive disease with rest pain   CPT  codes: 62778   stent placement iliac artery [id number]   introduction catheter below diaphragm third order  2. 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  The patient denies lifestyle limiting changes at this point in time.  In fact, she has gained weight.  Given the lack of symptoms no intervention is warranted at this time.   No further invasive studies, angiography or surgery at this time  The patient should continue walking and begin a more formal exercise program.   The patient should continue antiplatelet therapy and aggressive treatment of the lipid abnormalities  Patient should undergo noninvasive studies as ordered. The patient will follow up with me after the studies.   3. HYPERTENSION, BENIGN ESSENTIAL Continue antihypertensive medications as already ordered, these medications have been reviewed and there are no changes at this time.  4. Atherosclerosis of native coronary artery of native heart without angina pectoris Continue cardiac and antihypertensive medications as already ordered and reviewed, no changes at this time.  Continue statin as ordered and reviewed, no changes at this time  Nitrates PRN for chest pain  5. Chronic bronchitis, unspecified chronic bronchitis type (HCC) Continue pulmonary medications and aerosols as already ordered, these medications have been reviewed and there are no changes at this time.     [name], MD  06/10/2024 12:57 PM

## 2024-06-11 ENCOUNTER — Encounter (INDEPENDENT_AMBULATORY_CARE_PROVIDER_SITE_OTHER): Payer: Self-pay | Admitting: Vascular Surgery

## 2024-06-11 ENCOUNTER — Ambulatory Visit (INDEPENDENT_AMBULATORY_CARE_PROVIDER_SITE_OTHER): Admitting: Vascular Surgery

## 2024-06-11 VITALS — BP 170/69 | HR 79 | Ht 65.0 in | Wt 188.4 lb

## 2024-06-11 DIAGNOSIS — I708 Atherosclerosis of other arteries: Secondary | ICD-10-CM

## 2024-06-11 DIAGNOSIS — I10 Essential (primary) hypertension: Secondary | ICD-10-CM | POA: Diagnosis not present

## 2024-06-11 DIAGNOSIS — I2510 Atherosclerotic heart disease of native coronary artery without angina pectoris: Secondary | ICD-10-CM | POA: Diagnosis not present

## 2024-06-11 DIAGNOSIS — I70221 Atherosclerosis of native arteries of extremities with rest pain, right leg: Secondary | ICD-10-CM | POA: Diagnosis not present

## 2024-06-11 DIAGNOSIS — J42 Unspecified chronic bronchitis: Secondary | ICD-10-CM | POA: Diagnosis not present

## 2024-06-14 ENCOUNTER — Encounter (INDEPENDENT_AMBULATORY_CARE_PROVIDER_SITE_OTHER): Payer: Self-pay | Admitting: Vascular Surgery

## 2024-06-14 DIAGNOSIS — I70229 Atherosclerosis of native arteries of extremities with rest pain, unspecified extremity: Secondary | ICD-10-CM | POA: Insufficient documentation

## 2024-06-18 ENCOUNTER — Telehealth (INDEPENDENT_AMBULATORY_CARE_PROVIDER_SITE_OTHER): Payer: Self-pay

## 2024-06-18 NOTE — Telephone Encounter (Signed)
 I spoke with the patient and she is scheduled with Dr. Jama for a right leg angio with intervention on 07/14/24 with a 9:00 am arrival time to the [facility]. Pre-procedure instructions were discussed and will be sent to Mychart and mailed.

## 2024-07-09 ENCOUNTER — Other Ambulatory Visit: Payer: Self-pay | Admitting: Family Medicine

## 2024-07-14 ENCOUNTER — Ambulatory Visit
Admission: RE | Admit: 2024-07-14 | Discharge: 2024-07-14 | Disposition: A | Discharge status: Home / Self Care | Attending: Vascular Surgery | Admitting: Vascular Surgery

## 2024-07-14 ENCOUNTER — Encounter: Payer: Self-pay | Admitting: Vascular Surgery

## 2024-07-14 ENCOUNTER — Other Ambulatory Visit: Payer: Self-pay

## 2024-07-14 ENCOUNTER — Encounter
Admission: RE | Disposition: A | Discharge status: Home / Self Care | Payer: Self-pay | Source: Home / Self Care | Attending: Vascular Surgery

## 2024-07-14 DIAGNOSIS — I2510 Atherosclerotic heart disease of native coronary artery without angina pectoris: Secondary | ICD-10-CM | POA: Insufficient documentation

## 2024-07-14 DIAGNOSIS — I708 Atherosclerosis of other arteries: Secondary | ICD-10-CM | POA: Insufficient documentation

## 2024-07-14 DIAGNOSIS — I10 Essential (primary) hypertension: Secondary | ICD-10-CM | POA: Insufficient documentation

## 2024-07-14 DIAGNOSIS — I70229 Atherosclerosis of native arteries of extremities with rest pain, unspecified extremity: Secondary | ICD-10-CM

## 2024-07-14 DIAGNOSIS — J42 Unspecified chronic bronchitis: Secondary | ICD-10-CM | POA: Insufficient documentation

## 2024-07-14 DIAGNOSIS — Z79899 Other long term (current) drug therapy: Secondary | ICD-10-CM | POA: Diagnosis not present

## 2024-07-14 DIAGNOSIS — I70221 Atherosclerosis of native arteries of extremities with rest pain, right leg: Secondary | ICD-10-CM | POA: Diagnosis not present

## 2024-07-14 DIAGNOSIS — Z87891 Personal history of nicotine dependence: Secondary | ICD-10-CM | POA: Diagnosis not present

## 2024-07-14 DIAGNOSIS — Z0000 Encounter for general adult medical examination without abnormal findings: Secondary | ICD-10-CM

## 2024-07-14 DIAGNOSIS — I70223 Atherosclerosis of native arteries of extremities with rest pain, bilateral legs: Secondary | ICD-10-CM | POA: Insufficient documentation

## 2024-07-14 HISTORY — PX: LOWER EXTREMITY INTERVENTION: CATH118252

## 2024-07-14 HISTORY — PX: LOWER EXTREMITY ANGIOGRAPHY: CATH118251

## 2024-07-14 LAB — CREATININE, SERUM
Creatinine, Ser: 0.82 mg/dL (ref 0.44–1.00)
GFR, Estimated: >60 mL/min (ref >60)

## 2024-07-14 LAB — BUN: BUN: 18 mg/dL (ref 8–23)

## 2024-07-14 SURGERY — LOWER EXTREMITY INTERVENTION
Anesthesia: Moderate Sedation | Site: Leg Lower | Laterality: Right

## 2024-07-14 MED ORDER — DIPHENHYDRAMINE HCL 50 MG/ML IJ SOLN: 50.0000 mg | Freq: Once | INTRAMUSCULAR | Status: DC | PRN

## 2024-07-14 MED ORDER — MIDAZOLAM HCL 2 MG/ML PO SYRP: 8.0000 mg | ORAL_SOLUTION | Freq: Once | ORAL | Status: DC | PRN

## 2024-07-14 MED ORDER — FAMOTIDINE 20 MG PO TABS: 40.0000 mg | ORAL_TABLET | Freq: Once | ORAL | Status: DC | PRN

## 2024-07-14 MED ORDER — FENTANYL CITRATE (PF) 100 MCG/2ML IJ SOLN
INTRAMUSCULAR | Status: DC | PRN
  Administered 2024-07-14: 25 ug via INTRAVENOUS
  Administered 2024-07-14: 50 ug via INTRAVENOUS

## 2024-07-14 MED ORDER — FENTANYL CITRATE (PF) 100 MCG/2ML IJ SOLN
INTRAMUSCULAR | Status: AC
Start: 2024-07-14 — End: 2024-07-14
  Filled 2024-07-14: qty 2

## 2024-07-14 MED ORDER — LABETALOL HCL 5 MG/ML IV SOLN
INTRAVENOUS | Status: AC
Start: 2024-07-14 — End: 2024-07-14
  Filled 2024-07-14: qty 4

## 2024-07-14 MED ORDER — HYDRALAZINE HCL 20 MG/ML IJ SOLN
INTRAMUSCULAR | Status: AC
  Filled 2024-07-14: qty 1

## 2024-07-14 MED ORDER — CLOPIDOGREL BISULFATE 75 MG PO TABS
ORAL_TABLET | ORAL | Status: AC
Start: 2024-07-14 — End: 2024-07-14
  Filled 2024-07-14: qty 4

## 2024-07-14 MED ORDER — HEPARIN (PORCINE) IN NACL 1000-0.9 UT/500ML-% IV SOLN
INTRAVENOUS | Status: DC | PRN
  Administered 2024-07-14: 1000 mL

## 2024-07-14 MED ORDER — MIDAZOLAM HCL 2 MG/2ML IJ SOLN
INTRAMUSCULAR | Status: DC | PRN
Start: 2024-07-14 — End: 2024-07-14
  Administered 2024-07-14: 2 mg via INTRAVENOUS
  Administered 2024-07-14: 1 mg via INTRAVENOUS

## 2024-07-14 MED ORDER — HYDRALAZINE HCL 20 MG/ML IJ SOLN
INTRAMUSCULAR | Status: DC | PRN
  Administered 2024-07-14: 10 mg via INTRAVENOUS

## 2024-07-14 MED ORDER — CLOPIDOGREL BISULFATE 300 MG PO TABS
300.0000 mg | ORAL_TABLET | ORAL | Status: AC
  Administered 2024-07-14: 300 mg via ORAL

## 2024-07-14 MED ORDER — LABETALOL HCL 5 MG/ML IV SOLN: 10.0000 mg | INTRAVENOUS | Status: DC | PRN

## 2024-07-14 MED ORDER — SODIUM CHLORIDE 0.9 % IV SOLN: INTRAVENOUS | Status: DC

## 2024-07-14 MED ORDER — SODIUM CHLORIDE 0.9 % IV SOLN: 250.0000 mL | INTRAVENOUS | Status: DC | PRN

## 2024-07-14 MED ORDER — SODIUM CHLORIDE 0.9% FLUSH: 3.0000 mL | INTRAVENOUS | Status: DC | PRN

## 2024-07-14 MED ORDER — OXYCODONE HCL 5 MG PO TABS
5.0000 mg | ORAL_TABLET | ORAL | Status: DC | PRN
  Administered 2024-07-14: 5 mg via ORAL

## 2024-07-14 MED ORDER — MIDAZOLAM HCL 5 MG/5ML IJ SOLN
INTRAMUSCULAR | Status: AC
  Filled 2024-07-14: qty 5

## 2024-07-14 MED ORDER — METHYLPREDNISOLONE SODIUM SUCC 125 MG IJ SOLR: 125.0000 mg | Freq: Once | INTRAMUSCULAR | Status: DC | PRN

## 2024-07-14 MED ORDER — CLOPIDOGREL BISULFATE 75 MG PO TABS: 75.0000 mg | ORAL_TABLET | Freq: Every day | ORAL | 5 refills | Status: DC

## 2024-07-14 MED ORDER — LIDOCAINE HCL (PF) 1 % IJ SOLN
INTRAMUSCULAR | Status: DC | PRN
  Administered 2024-07-14: 10 mL

## 2024-07-14 MED ORDER — SODIUM CHLORIDE 0.9% FLUSH: 3.0000 mL | Freq: Two times a day (BID) | INTRAVENOUS | Status: DC

## 2024-07-14 MED ORDER — HEPARIN SODIUM (PORCINE) 1000 UNIT/ML IJ SOLN
INTRAMUSCULAR | Status: DC | PRN
  Administered 2024-07-14: 6000 [IU] via INTRAVENOUS

## 2024-07-14 MED ORDER — OXYCODONE HCL 5 MG PO TABS
ORAL_TABLET | ORAL | Status: AC
Start: 2024-07-14 — End: 2024-07-14
  Filled 2024-07-14: qty 1

## 2024-07-14 MED ORDER — ONDANSETRON HCL 4 MG/2ML IJ SOLN: 4.0000 mg | Freq: Four times a day (QID) | INTRAMUSCULAR | Status: DC | PRN

## 2024-07-14 MED ORDER — MORPHINE SULFATE (PF) 2 MG/ML IV SOLN: 2.0000 mg | INTRAVENOUS | Status: DC | PRN

## 2024-07-14 MED ORDER — LABETALOL HCL 5 MG/ML IV SOLN
INTRAVENOUS | Status: DC | PRN
  Administered 2024-07-14: 10 mg via INTRAVENOUS

## 2024-07-14 MED ORDER — CEFAZOLIN SODIUM-DEXTROSE 2-4 GM/100ML-% IV SOLN
2.0000 g | INTRAVENOUS | Status: AC
  Administered 2024-07-14: 2 g via INTRAVENOUS

## 2024-07-14 MED ORDER — IODIXANOL 320 MG/ML IV SOLN
INTRAVENOUS | Status: DC | PRN
  Administered 2024-07-14: 75 mL

## 2024-07-14 MED ORDER — HYDRALAZINE HCL 20 MG/ML IJ SOLN: 5.0000 mg | INTRAMUSCULAR | Status: DC | PRN

## 2024-07-14 MED ORDER — ACETAMINOPHEN 325 MG PO TABS: 650.0000 mg | ORAL_TABLET | ORAL | Status: DC | PRN

## 2024-07-14 MED ORDER — HEPARIN SODIUM (PORCINE) 1000 UNIT/ML IJ SOLN
INTRAMUSCULAR | Status: AC
  Filled 2024-07-14: qty 10

## 2024-07-14 MED ORDER — CEFAZOLIN SODIUM-DEXTROSE 2-4 GM/100ML-% IV SOLN
INTRAVENOUS | Status: AC
  Filled 2024-07-14: qty 100

## 2024-07-14 SURGICAL SUPPLY — 23 items
BALLOON LUTONIX 5X150X130 (BALLOONS) IMPLANT
BALLOON LUTONIX DCB 7X60X130 (BALLOONS) IMPLANT
BALLOON ULTRVRSE 150X40X2.5 (BALLOONS) IMPLANT
BALLOON ULTRVRSE 2.5X80X150 (BALLOONS) IMPLANT
CATH BEACON 5 .038 100 VERT TP (CATHETERS) IMPLANT
CATH PIG 70CM (CATHETERS) IMPLANT
COVER PROBE ULTRASOUND 5X96 (MISCELLANEOUS) IMPLANT
DEVICE PRESTO INFLATION (MISCELLANEOUS) IMPLANT
DEVICE STARCLOSE SE CLOSURE (Vascular Products) IMPLANT
DEVICE VASC CLSR CELT ART 6 (Vascular Products) IMPLANT
GLIDEWIRE ADV .035X260CM (WIRE) IMPLANT
GOWN STRL REUS W/ TWL LRG LVL3 (GOWN DISPOSABLE) ×1 IMPLANT
NDL ENTRY 21GA 7CM ECHOTIP (NEEDLE) IMPLANT
NEEDLE ENTRY 21GA 7CM ECHOTIP (NEEDLE) ×1 IMPLANT
PACK ANGIOGRAPHY (CUSTOM PROCEDURE TRAY) ×1 IMPLANT
SET INTRO CAPELLA COAXIAL (SET/KITS/TRAYS/PACK) IMPLANT
SHEATH ANL2 6FRX45 HC (SHEATH) IMPLANT
SHEATH BRITE TIP 6FRX11 (SHEATH) IMPLANT
STENT LIFESTENT 5F 6X150X135 (Permanent Stent) IMPLANT
STENT LIFESTENT 5F 7X60X135 (Permanent Stent) IMPLANT
TUBING CONTRAST HIGH PRESS 72 (TUBING) IMPLANT
WIRE COMMAND ST ANG 014 300 (WIRE) IMPLANT
WIRE J 3MM .035X145CM (WIRE) IMPLANT

## 2024-07-14 NOTE — Interval H&P Note (Signed)
 History and Physical Interval Note:  07/14/2024 9:56 AM  Erin Hinton  has presented today for surgery, with the diagnosis of RLE Angio w intervention    ASO w rest pain.  The various methods of treatment have been discussed with the patient and family. After consideration of risks, benefits and other options for treatment, the patient has consented to  Procedure(s): LOWER EXTREMITY INTERVENTION (Right) Lower Extremity Angiography (Right) as a surgical intervention.  The patient's history has been reviewed, patient examined, no change in status, stable for surgery.  I have reviewed the patient's chart and labs.  Questions were answered to the patient's satisfaction.     [name]

## 2024-07-14 NOTE — H&P (View-Only) (Signed)
 MRN : [id number]  Erin Hinton is a 78 y.o. (1946-02-20) female who presents with chief complaint of check circulation.  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  She was last seen in the office on 06/11/2024.  Her initial evaluation was based on an abnormal ABI.  Subsequently a CT of the abdomen and pelvis was obtained which demonstrates a high-grade stenosis in the right external iliac artery.  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   Patient notes the pain is always associated with activity and is very consistent day today. Typically, the pain occurs at less than one block, progress is as activity continues to the point that the patient must stop walking. Resting including standing still for several minutes allows the patient to walk a similar distance before being forced to stop again. Uneven terrain and inclines shorten the distance. The pain has been progressive over the past several years. The patient denies any abrupt changes in claudication symptoms.  The patient states the inability to walk is causing problems with daily activities.   The patient is describing some rest pain but no dangling of an extremity off the side of the bed during the night for relief. No open wounds or sores at this time. No prior interventions or surgeries.   No history of back problems or DJD of the lumbar sacral spine.    The patient's blood pressure has been stable and relatively well controlled. The patient denies amaurosis fugax or recent TIA symptoms. There are no recent neurological changes noted. The patient denies history of DVT, PE or superficial thrombophlebitis. The patient denies recent episodes of angina or shortness of breath.      CT scan dated  May 19, 2024 is reviewed by me personally.  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  This is a long segment occlusion.  SMA is quite prominent and well collateralized.  IMA is patent as well.  Additionally there is a 75 to 80% stenosis of the right external iliac artery.    Current Meds  Medication Sig   acetaminophen  (TYLENOL ) 500 MG tablet Take 500 mg by mouth every 6 (six) hours as needed (for pain.).   albuterol  (VENTOLIN  HFA) 108 (90 Base) MCG/ACT inhaler Inhale 2 puffs into the lungs every 4 (four) hours as needed for wheezing or shortness of breath.   Ascorbic Acid (VITAMIN C WITH ROSE HIPS) 1000 MG tablet Take 1,000 mg by mouth daily.   Calcium  Carb-Cholecalciferol (CALCIUM  600 + D PO) Take 1 capsule by mouth daily.   Calcium -Magnesium-Zinc (CAL-MAG-ZINC PO) Take 1 tablet by mouth daily.   Cholecalciferol (VITAMIN D ) 50 MCG (2000 UT) tablet Take 2,000 Units by mouth daily.   escitalopram  (LEXAPRO ) 20 MG tablet TAKE 1 TABLET BY MOUTH EVERY DAY   fluticasone  (FLONASE ) 50 MCG/ACT nasal spray Place 2 sprays into both nostrils daily as needed for allergies.   fluticasone -salmeterol (WIXELA INHUB) 250-50 MCG/ACT AEPB INHALE 1 PUFF INTO  THE LUNGS IN THE MORNING AND AT BEDTIME.   hydrochlorothiazide  (HYDRODIURIL ) 25 MG tablet TAKE 1 TABLET BY MOUTH EVERY DAY   Omega-3 Fatty Acids (FISH OIL) 1000 MG CAPS Take 1,000 mg by mouth daily.   vitamin E 1000 UNIT capsule Take 1 capsule by mouth daily.    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 ER positive: 51-90%   Depression    Heart murmur    in her 30's and it is not a problem now   Hyperlipidemia    Hyperlipidemia    Hypertension    Osteopenia    Personal history of radiation therapy    Shortness of  breath dyspnea    due to excercise only    Past Surgical History:  Procedure Laterality Date   BREAST BIOPSY Left 01/27/2019   Affirm Bx- Coil Clip- HIGH-GRADE DUCTAL CARCINOMA IN SITU, WITH COMEDONECROSIS.   BREAST EXCISIONAL BIOPSY Right 01/2002   neg. no lymph node involvement   BREAST EXCISIONAL BIOPSY Left 03/02/2019   lumpectomy DCIS   BREAST LUMPECTOMY Left 03/02/2019   left lumpectomy dcis with NL   BREAST LUMPECTOMY Left 03/02/2019   Procedure: BREAST WIDE EXCISION LEFT, WITH WIRE LOCATION;  Surgeon: Dessa Reyes ORN, MD;  Location: [facility] ORS;  Service: General;  Laterality: Left;   BREAST SURGERY  1998   breast biopsy DCIS   CATARACT EXTRACTION W/PHACO Right 05/01/2016   Procedure: CATARACT EXTRACTION PHACO AND INTRAOCULAR LENS PLACEMENT (IOC);  Surgeon: Elsie Carmine, MD;  Location: [facility] ORS;  Service: Ophthalmology;  Laterality: Right;  US  00:53 AP% 21.7 CDE 11.50 fluid pack lot # [id number]   CATARACT EXTRACTION W/PHACO Left 06/05/2016   Procedure: CATARACT EXTRACTION PHACO AND INTRAOCULAR LENS PLACEMENT (IOC);  Surgeon: Elsie Carmine, MD;  Location: [facility] ORS;  Service: Ophthalmology;  Laterality: Left;  US  01:04 AP% 22.2 CDE 14.34 fluid pack lot # [id number]   COLONOSCOPY WITH PROPOFOL  N/A 11/21/2015   Procedure: COLONOSCOPY WITH PROPOFOL ;  Surgeon: Deward CINDERELLA Piedmont, MD;  Location: [facility]COPY;  Service: Gastroenterology;  Laterality: N/A;   EYE SURGERY     Cataract   SKIN BIOPSY  01/01/2023   Upper Back    Social History Social History   Tobacco Use   Smoking status: Former    Current packs/day: 0.00    Average packs/day: 0.5 packs/day for 45.0 years (22.5 ttl pk-yrs)    Types: Cigarettes, E-cigarettes    Start date: 01/21/1971    Quit date: 01/22/2016    Years since quitting: 8.4    Passive exposure: Past   Smokeless tobacco: Never   Tobacco comments:    also uses e-cigaretts on occasion  Vaping Use   Vaping status: Some Days   Substances: Nicotine,  Flavoring  Substance Use Topics   Alcohol use: No   Drug use: No    Family History Family History  Problem Relation Age of Onset   Ovarian cancer Mother    Heart disease Sister    Lung cancer Sister    Lymphoma Father    Heart disease Father        MI   Hypertension Father    Cancer Father    Alcohol abuse Brother  Lung cancer Brother    Breast cancer Sister 70   Heart disease Sister    Heart attack Sister    Breast cancer Daughter 64       Charleen Ee, LCIS   Breast cancer Paternal Aunt 60   Colon cancer Neg Hx     Allergies  Allergen Reactions   Alendronate Sodium     GI pain   Atorvastatin     Muscle pain   Crestor  [Rosuvastatin  Calcium ]     Muscle pain   Evista [Raloxifene] Other (See Comments)    Breast tenderness   Rosuvastatin  Other (See Comments)   Sertraline Hcl     Did not help, made anxiety worse   Pseudoephedrine Palpitations     REVIEW OF SYSTEMS (Negative unless checked)  Constitutional: [] Weight loss  [] Fever  [] Chills Cardiac: [] Chest pain   [] Chest pressure   [] Palpitations   [] Shortness of breath when laying flat   [] Shortness of breath with exertion. Vascular:  [x] Pain in legs with walking   [] Pain in legs at rest  [] History of DVT   [] Phlebitis   [] Swelling in legs   [] Varicose veins   [] Non-healing ulcers Pulmonary:   [] Uses home oxygen   [] Productive cough   [] Hemoptysis   [] Wheeze  [] COPD   [] Asthma Neurologic:  [] Dizziness   [] Seizures   [] History of stroke   [] History of TIA  [] Aphasia   [] Vissual changes   [] Weakness or numbness in arm   [] Weakness or numbness in leg Musculoskeletal:   [] Joint swelling   [] Joint pain   [] Low back pain Hematologic:  [] Easy bruising  [] Easy bleeding   [] Hypercoagulable state   [] Anemic Gastrointestinal:  [] Diarrhea   [] Vomiting  [] Gastroesophageal reflux/heartburn   [] Difficulty swallowing. Genitourinary:  [] Chronic kidney disease   [] Difficult urination  [] Frequent urination   [] Blood in urine Skin:   [] Rashes   [] Ulcers  Psychological:  [] History of anxiety   []  History of major depression.  Physical Examination  Vitals:   07/14/24 0900  BP: (!) 166/78  Resp: (!) 64  Temp: (P) 98.5 F (36.9 C)  TempSrc: (P) Temporal  SpO2: 97%  Weight: 86 kg  Height: 5' 5 (1.651 m)   Body mass index is 31.53 kg/m. Gen: WD/WN, NAD Head: [place]/AT, No temporalis wasting.  Ear/Nose/Throat: Hearing grossly intact, nares w/o erythema or drainage Eyes: PER, EOMI, sclera nonicteric.  Neck: Supple, no masses.  No bruit or JVD.  Pulmonary:  Good air movement, no audible wheezing, no use of accessory muscles.  Cardiac: RRR, normal S1, S2, no Murmurs. Vascular:  mild trophic changes, no open wounds Vessel Right Left  Radial Palpable Palpable  PT Not Palpable Not Palpable  DP Not Palpable Not Palpable  Gastrointestinal: soft, non-distended. No guarding/no peritoneal signs.  Musculoskeletal: M/S 5/5 throughout.  No visible deformity.  Neurologic: CN 2-12 intact. Pain and light touch intact in extremities.  Symmetrical.  Speech is fluent. Motor exam as listed above. Psychiatric: Judgment intact, Mood & affect appropriate for pt's clinical situation. Dermatologic: No rashes or ulcers noted.  No changes consistent with cellulitis.   CBC Lab Results  Component Value Date   WBC 6.1 02/26/2024   HGB 14.0 02/26/2024   HCT 41.9 02/26/2024   MCV 99.3 02/26/2024   PLT 232.0 02/26/2024    BMET    Component Value Date/Time   NA 144 05/13/2024 1046   K 4.3 05/13/2024 1046   CL 103 05/13/2024 1046   CO2 24 05/13/2024 1046  GLUCOSE 99 05/13/2024 1046   GLUCOSE 98 02/26/2024 1121   BUN 18 07/14/2024 0907   BUN 15 05/13/2024 1046   CREATININE 0.82 07/14/2024 0907   CALCIUM  10.3 05/13/2024 1046   GFRNONAA >60 07/14/2024 0907   GFRAA 94 11/23/2008 0841   Estimated Creatinine Clearance: 64.2 mL/min (by C-G formula based on SCr of 0.82 mg/dL).  COAG No results found for: INR,  PROTIME  Radiology No results found.   Assessment/Plan 1. Atherosclerosis of native artery of right lower extremity with rest pain (HCC) Recommend:   The patient has evidence of severe atherosclerotic changes of both lower extremities with rest pain that is associated with preulcerative changes and impending tissue loss of the right foot.  This represents a limb threatening ischemia and places the patient at the risk for right limb loss.   Patient should undergo angiography of the right lower extremity with the hope for intervention for limb salvage.  The risks and benefits as well as the alternative therapies was discussed in detail with the patient.  All questions were answered.  Patient agrees to proceed with right lower extremity angiography.   The patient will follow up with me in the office after the procedure.    ICD Codes: I70.229    Atherosclerotic occlusive disease with rest pain     CPT codes: 62778   stent placement iliac artery 36247   introduction catheter below diaphragm third order    2. 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  The patient denies lifestyle limiting changes at this point in time.  In fact, she has gained weight.  Given the lack of symptoms no intervention is warranted at this time.    No further invasive studies, angiography or surgery at this time   The patient should continue walking and begin a more formal exercise program.   The patient should continue antiplatelet therapy and aggressive treatment of the lipid abnormalities   Patient should undergo noninvasive studies as ordered. The patient will follow up with me after the studies.    3. HYPERTENSION, BENIGN ESSENTIAL Continue antihypertensive medications as already ordered, these medications have been reviewed and there are no changes at this time.   4.  Atherosclerosis of native coronary artery of native heart without angina pectoris Continue cardiac and antihypertensive medications as already ordered and reviewed, no changes at this time.   Continue statin as ordered and reviewed, no changes at this time   Nitrates PRN for chest pain   5. Chronic bronchitis, unspecified chronic bronchitis type (HCC) Continue pulmonary medications and aerosols as already ordered, these medications have been reviewed and there are no changes at this time.       [name], MD  07/14/2024 9:54 AM

## 2024-07-14 NOTE — Progress Notes (Signed)
 MRN : [id number]  Erin Hinton is a 78 y.o. (1946-02-20) female who presents with chief complaint of check circulation.  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  She was last seen in the office on 06/11/2024.  Her initial evaluation was based on an abnormal ABI.  Subsequently a CT of the abdomen and pelvis was obtained which demonstrates a high-grade stenosis in the right external iliac artery.  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   Patient notes the pain is always associated with activity and is very consistent day today. Typically, the pain occurs at less than one block, progress is as activity continues to the point that the patient must stop walking. Resting including standing still for several minutes allows the patient to walk a similar distance before being forced to stop again. Uneven terrain and inclines shorten the distance. The pain has been progressive over the past several years. The patient denies any abrupt changes in claudication symptoms.  The patient states the inability to walk is causing problems with daily activities.   The patient is describing some rest pain but no dangling of an extremity off the side of the bed during the night for relief. No open wounds or sores at this time. No prior interventions or surgeries.   No history of back problems or DJD of the lumbar sacral spine.    The patient's blood pressure has been stable and relatively well controlled. The patient denies amaurosis fugax or recent TIA symptoms. There are no recent neurological changes noted. The patient denies history of DVT, PE or superficial thrombophlebitis. The patient denies recent episodes of angina or shortness of breath.      CT scan dated  May 19, 2024 is reviewed by me personally.  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  This is a long segment occlusion.  SMA is quite prominent and well collateralized.  IMA is patent as well.  Additionally there is a 75 to 80% stenosis of the right external iliac artery.    Current Meds  Medication Sig   acetaminophen  (TYLENOL ) 500 MG tablet Take 500 mg by mouth every 6 (six) hours as needed (for pain.).   albuterol  (VENTOLIN  HFA) 108 (90 Base) MCG/ACT inhaler Inhale 2 puffs into the lungs every 4 (four) hours as needed for wheezing or shortness of breath.   Ascorbic Acid (VITAMIN C WITH ROSE HIPS) 1000 MG tablet Take 1,000 mg by mouth daily.   Calcium  Carb-Cholecalciferol (CALCIUM  600 + D PO) Take 1 capsule by mouth daily.   Calcium -Magnesium-Zinc (CAL-MAG-ZINC PO) Take 1 tablet by mouth daily.   Cholecalciferol (VITAMIN D ) 50 MCG (2000 UT) tablet Take 2,000 Units by mouth daily.   escitalopram  (LEXAPRO ) 20 MG tablet TAKE 1 TABLET BY MOUTH EVERY DAY   fluticasone  (FLONASE ) 50 MCG/ACT nasal spray Place 2 sprays into both nostrils daily as needed for allergies.   fluticasone -salmeterol (WIXELA INHUB) 250-50 MCG/ACT AEPB INHALE 1 PUFF INTO  THE LUNGS IN THE MORNING AND AT BEDTIME.   hydrochlorothiazide  (HYDRODIURIL ) 25 MG tablet TAKE 1 TABLET BY MOUTH EVERY DAY   Omega-3 Fatty Acids (FISH OIL) 1000 MG CAPS Take 1,000 mg by mouth daily.   vitamin E 1000 UNIT capsule Take 1 capsule by mouth daily.    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 ER positive: 51-90%   Depression    Heart murmur    in her 30's and it is not a problem now   Hyperlipidemia    Hyperlipidemia    Hypertension    Osteopenia    Personal history of radiation therapy    Shortness of  breath dyspnea    due to excercise only    Past Surgical History:  Procedure Laterality Date   BREAST BIOPSY Left 01/27/2019   Affirm Bx- Coil Clip- HIGH-GRADE DUCTAL CARCINOMA IN SITU, WITH COMEDONECROSIS.   BREAST EXCISIONAL BIOPSY Right 01/2002   neg. no lymph node involvement   BREAST EXCISIONAL BIOPSY Left 03/02/2019   lumpectomy DCIS   BREAST LUMPECTOMY Left 03/02/2019   left lumpectomy dcis with NL   BREAST LUMPECTOMY Left 03/02/2019   Procedure: BREAST WIDE EXCISION LEFT, WITH WIRE LOCATION;  Surgeon: Dessa Reyes ORN, MD;  Location: [facility] ORS;  Service: General;  Laterality: Left;   BREAST SURGERY  1998   breast biopsy DCIS   CATARACT EXTRACTION W/PHACO Right 05/01/2016   Procedure: CATARACT EXTRACTION PHACO AND INTRAOCULAR LENS PLACEMENT (IOC);  Surgeon: Elsie Carmine, MD;  Location: [facility] ORS;  Service: Ophthalmology;  Laterality: Right;  US  00:53 AP% 21.7 CDE 11.50 fluid pack lot # [id number]   CATARACT EXTRACTION W/PHACO Left 06/05/2016   Procedure: CATARACT EXTRACTION PHACO AND INTRAOCULAR LENS PLACEMENT (IOC);  Surgeon: Elsie Carmine, MD;  Location: [facility] ORS;  Service: Ophthalmology;  Laterality: Left;  US  01:04 AP% 22.2 CDE 14.34 fluid pack lot # [id number]   COLONOSCOPY WITH PROPOFOL  N/A 11/21/2015   Procedure: COLONOSCOPY WITH PROPOFOL ;  Surgeon: Deward CINDERELLA Piedmont, MD;  Location: [facility] ENDOSCOPY;  Service: Gastroenterology;  Laterality: N/A;   EYE SURGERY     Cataract   SKIN BIOPSY  01/01/2023   Upper Back    Social History Social History   Tobacco Use   Smoking status: Former    Current packs/day: 0.00    Average packs/day: 0.5 packs/day for 45.0 years (22.5 ttl pk-yrs)    Types: Cigarettes, E-cigarettes    Start date: 01/21/1971    Quit date: 01/22/2016    Years since quitting: 8.4    Passive exposure: Past   Smokeless tobacco: Never   Tobacco comments:    also uses e-cigaretts on occasion  Vaping Use   Vaping status: Some Days   Substances: Nicotine,  Flavoring  Substance Use Topics   Alcohol use: No   Drug use: No    Family History Family History  Problem Relation Age of Onset   Ovarian cancer Mother    Heart disease Sister    Lung cancer Sister    Lymphoma Father    Heart disease Father        MI   Hypertension Father    Cancer Father    Alcohol abuse Brother  Lung cancer Brother    Breast cancer Sister 70   Heart disease Sister    Heart attack Sister    Breast cancer Daughter 64       Charleen Ee, LCIS   Breast cancer Paternal Aunt 60   Colon cancer Neg Hx     Allergies  Allergen Reactions   Alendronate Sodium     GI pain   Atorvastatin     Muscle pain   Crestor  [Rosuvastatin  Calcium ]     Muscle pain   Evista [Raloxifene] Other (See Comments)    Breast tenderness   Rosuvastatin  Other (See Comments)   Sertraline Hcl     Did not help, made anxiety worse   Pseudoephedrine Palpitations     REVIEW OF SYSTEMS (Negative unless checked)  Constitutional: [] Weight loss  [] Fever  [] Chills Cardiac: [] Chest pain   [] Chest pressure   [] Palpitations   [] Shortness of breath when laying flat   [] Shortness of breath with exertion. Vascular:  [x] Pain in legs with walking   [] Pain in legs at rest  [] History of DVT   [] Phlebitis   [] Swelling in legs   [] Varicose veins   [] Non-healing ulcers Pulmonary:   [] Uses home oxygen   [] Productive cough   [] Hemoptysis   [] Wheeze  [] COPD   [] Asthma Neurologic:  [] Dizziness   [] Seizures   [] History of stroke   [] History of TIA  [] Aphasia   [] Vissual changes   [] Weakness or numbness in arm   [] Weakness or numbness in leg Musculoskeletal:   [] Joint swelling   [] Joint pain   [] Low back pain Hematologic:  [] Easy bruising  [] Easy bleeding   [] Hypercoagulable state   [] Anemic Gastrointestinal:  [] Diarrhea   [] Vomiting  [] Gastroesophageal reflux/heartburn   [] Difficulty swallowing. Genitourinary:  [] Chronic kidney disease   [] Difficult urination  [] Frequent urination   [] Blood in urine Skin:   [] Rashes   [] Ulcers  Psychological:  [] History of anxiety   []  History of major depression.  Physical Examination  Vitals:   07/14/24 0900  BP: (!) 166/78  Resp: (!) 64  Temp: (P) 98.5 F (36.9 C)  TempSrc: (P) Temporal  SpO2: 97%  Weight: 86 kg  Height: 5' 5 (1.651 m)   Body mass index is 31.53 kg/m. Gen: WD/WN, NAD Head: [place]/AT, No temporalis wasting.  Ear/Nose/Throat: Hearing grossly intact, nares w/o erythema or drainage Eyes: PER, EOMI, sclera nonicteric.  Neck: Supple, no masses.  No bruit or JVD.  Pulmonary:  Good air movement, no audible wheezing, no use of accessory muscles.  Cardiac: RRR, normal S1, S2, no Murmurs. Vascular:  mild trophic changes, no open wounds Vessel Right Left  Radial Palpable Palpable  PT Not Palpable Not Palpable  DP Not Palpable Not Palpable  Gastrointestinal: soft, non-distended. No guarding/no peritoneal signs.  Musculoskeletal: M/S 5/5 throughout.  No visible deformity.  Neurologic: CN 2-12 intact. Pain and light touch intact in extremities.  Symmetrical.  Speech is fluent. Motor exam as listed above. Psychiatric: Judgment intact, Mood & affect appropriate for pt's clinical situation. Dermatologic: No rashes or ulcers noted.  No changes consistent with cellulitis.   CBC Lab Results  Component Value Date   WBC 6.1 02/26/2024   HGB 14.0 02/26/2024   HCT 41.9 02/26/2024   MCV 99.3 02/26/2024   PLT 232.0 02/26/2024    BMET    Component Value Date/Time   NA 144 05/13/2024 1046   K 4.3 05/13/2024 1046   CL 103 05/13/2024 1046   CO2 24 05/13/2024 1046  GLUCOSE 99 05/13/2024 1046   GLUCOSE 98 02/26/2024 1121   BUN 18 07/14/2024 0907   BUN 15 05/13/2024 1046   CREATININE 0.82 07/14/2024 0907   CALCIUM  10.3 05/13/2024 1046   GFRNONAA >60 07/14/2024 0907   GFRAA 94 11/23/2008 0841   Estimated Creatinine Clearance: 64.2 mL/min (by C-G formula based on SCr of 0.82 mg/dL).  COAG No results found for: INR,  PROTIME  Radiology No results found.   Assessment/Plan 1. Atherosclerosis of native artery of right lower extremity with rest pain (HCC) Recommend:   The patient has evidence of severe atherosclerotic changes of both lower extremities with rest pain that is associated with preulcerative changes and impending tissue loss of the right foot.  This represents a limb threatening ischemia and places the patient at the risk for right limb loss.   Patient should undergo angiography of the right lower extremity with the hope for intervention for limb salvage.  The risks and benefits as well as the alternative therapies was discussed in detail with the patient.  All questions were answered.  Patient agrees to proceed with right lower extremity angiography.   The patient will follow up with me in the office after the procedure.    ICD Codes: I70.229    Atherosclerotic occlusive disease with rest pain     CPT codes: 62778   stent placement iliac artery 36247   introduction catheter below diaphragm third order    2. 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  The patient denies lifestyle limiting changes at this point in time.  In fact, she has gained weight.  Given the lack of symptoms no intervention is warranted at this time.    No further invasive studies, angiography or surgery at this time   The patient should continue walking and begin a more formal exercise program.   The patient should continue antiplatelet therapy and aggressive treatment of the lipid abnormalities   Patient should undergo noninvasive studies as ordered. The patient will follow up with me after the studies.    3. HYPERTENSION, BENIGN ESSENTIAL Continue antihypertensive medications as already ordered, these medications have been reviewed and there are no changes at this time.   4.  Atherosclerosis of native coronary artery of native heart without angina pectoris Continue cardiac and antihypertensive medications as already ordered and reviewed, no changes at this time.   Continue statin as ordered and reviewed, no changes at this time   Nitrates PRN for chest pain   5. Chronic bronchitis, unspecified chronic bronchitis type (HCC) Continue pulmonary medications and aerosols as already ordered, these medications have been reviewed and there are no changes at this time.       [name], MD  07/14/2024 9:54 AM

## 2024-07-14 NOTE — Op Note (Signed)
 [facility]  Percutaneous Study/Intervention Procedural Note   Date of Surgery: 07/14/2024  Surgeon:  [name]  Pre-operative Diagnosis: Atherosclerotic occlusive disease bilateral lower extremities with right lower extremity with rest pain and lifestyle limiting claudication.  Post-operative diagnosis:  Same  Procedure(s) Performed:             1.  Introduction catheter into right lower extremity 3rd order catheter placement               2.    Contrast injection right lower extremity for distal runoff             3.  Percutaneous transluminal angioplasty and stent placement right superficial femoral artery to 5 mm             4.  Percutaneous transluminal angioplasty right posterior tibial artery to 2.5 mm             5.  Percutaneous transluminal angioplasty and stent placement right external iliac artery to 7 mm.               6.  Star close closure left common femoral arteriotomy  Anesthesia: Conscious sedation was administered under my direct supervision by the interventional radiology RN. IV Versed  plus fentanyl  were utilized. Continuous ECG, pulse oximetry and blood pressure was monitored throughout the entire procedure.  Conscious sedation was for a total of 74 minutes.  Sheath: 6 [place] Ansell left common femoral retrograde  Contrast: 75 cc  Fluoroscopy Time: 10.8 minutes  Indications:  Erin Hinton presents with increasing pain of the right lower extremity.  She notes that there is increasing pain at rest as well as a profound shortening of her claudication distance.  Physical examination as well as noninvasive studies demonstrate severe atherosclerotic occlusive disease.  This suggests the patient is having limb threatening ischemia.  Angiography with hope for intervention is recommended.  The risks and benefits are reviewed all questions answered patient agrees to proceed.  Procedure:   Erin Hinton is a 78 y.o. y.o. female who was  identified and appropriate procedural time out was performed.  The patient was then placed supine on the table and prepped and draped in the usual sterile fashion.    Ultrasound was placed in the sterile sleeve and the left groin was evaluated the left common femoral artery was echolucent and pulsatile indicating patency.  Image was recorded for the permanent record and under real-time visualization a microneedle was inserted into the common femoral artery followed by the microwire and then the micro-sheath.  A J-wire was then advanced through the micro-sheath and a  5 [place] sheath was then inserted over a J-wire. J-wire was then advanced and a 5 French pigtail catheter was positioned at the level of T12.  AP projection of the aorta was then obtained. Pigtail catheter was repositioned to above the bifurcation and a LAO view of the pelvis was obtained.  Subsequently a pigtail catheter with an Advantage wire was used to cross the aortic bifurcation.  The catheter and wire were advanced down into the right distal external iliac artery. Oblique view of the femoral bifurcation was then obtained and subsequently the wire was reintroduced and the pigtail catheter negotiated into the SFA representing third order catheter placement. Distal runoff was then performed.  6000 units of heparin  was then given and allowed to circulate for several minutes.  A 6 French Ansell sheath was advanced up and over the bifurcation and positioned in  the distal common iliac artery.  Magnified imaging through the sheath is then obtained to the distal external iliac artery.  After appropriate measurements a 7 mm x 60 mm life stent is deployed.  It is then postdilated using a 7 mm x 60 mm Lutonix drug-eluting balloon.  The balloon was then repositioned proximally and a second angioplasty was performed.  Inflations were to approximately 10 atm for 1 minute.  Follow-up imaging demonstrated less than 10% residual stenosis.  The sheath's  dilator was reintroduced and under fluoroscopic guidance the sheath was advanced so that the tip of the sheath was now in the proximal SFA.  Magnified imaging was then performed of the SFA lesion after appropriate measurements a 6 mm x 150 mm life stent was deployed extending from Hunter's canal proximally.  It was then postdilated with a 5 mm x 150 mm Lutonix drug-eluting balloon inflated to 10 atm for 1 full minute.  Follow-up imaging demonstrated less than 10% residual stenosis throughout the SFA.  Popliteal arteries widely patent.    A vertebral catheter was then advanced over the wire down into the below-knee popliteal where magnified imaging of the trifurcation is performed.  Using the vertebral catheter and a command wire the wire and catheter were negotiated into the posterior tibial artery.  Magnified imaging of the posterior tibial artery was then obtained and a 2.5 mm x 100 mm Ultraverse balloon was advanced across the lesion inflated to 12 atm for 2 full minutes.  Follow-up imaging demonstrated less than 10% residual stenosis with preservation of the distal runoff.  After review of these images the sheath is pulled into the left external iliac oblique of the common femoral is obtained the Clementeen was then exchanged for a 11 cm 6 French sheath.  A Celt device was then attempted to pass through the sheath but it would not advance.  Upon removal of the sheath was clearly being deformed by the Celt device.  A second Celt device was opened onto the field but it 2 became stuck in the sheath and would not advance.  I exchanged the 11 cm sheath to a brand-new sheath but the second Celt device did the exact same thing and deformed the sheath.  At this point I introduced the J-wire and a Star close device deployed without. There no immediate Complications.  Findings:  The abdominal aorta is opacified with a bolus injection contrast. Renal arteries are single and widely patent without evidence of  hemodynamically significant stenosis.  The aorta itself has diffuse disease but no hemodynamically significant lesions. The common and external iliac arteries are widely patent on the left.  On the right the common iliac artery is widely patent.  The external iliac artery demonstrates diffuse disease beginning from its origin with a focal greater than 90% stenosis approximately 1 cm proximal to the circumflex vessels.  The right common femoral is widely patent as is the profunda femoris.  The SFA does indeed have a significant stenosis of greater than 90% in its midportion with diffuse greater than 50% disease throughout a segment of approximately 120 mm.  The popliteal is patent and free of hemodynamically significant stenosis and the trifurcation is diseased with greater than 80% narrowing of the anterior tibial just distal to its origin.  The tibioperoneal trunk and peroneal is free of hemodynamically significant stenosis throughout its course, however the peroneal is quite small by the time it reaches the ankle and does not contribute significantly to the foot.  The  posterior tibial demonstrates a subtotal occlusion extending over approximately 60 to 80 mm in length just distal to its origin.  Beyond this lesion the posterior tibial is quite nice measuring 2-1/2 to 3 mm and filling the plantar arteries which are also quite large which fills the pedal arch in its entirety.    Following angioplasty posterior tibial now is in-line flow and looks quite nice with less than 10% residual stenosis. Angioplasty and stent placement of the SFA just proximal to Hunter's canal yields an excellent result with less than 10% residual stenosis.  Angioplasty and stent placement of the external iliac artery yields an excellent result with less than 10% residual stenosis  Summary: Successful recanalization right lower extremity for limb salvage                           Disposition: Patient was taken to the recovery room  in stable condition having tolerated the procedure well.  Cordella Ajene Carchi 07/14/2024,11:26 AM

## 2024-07-18 ENCOUNTER — Encounter (INDEPENDENT_AMBULATORY_CARE_PROVIDER_SITE_OTHER): Payer: Self-pay

## 2024-07-20 ENCOUNTER — Other Ambulatory Visit (INDEPENDENT_AMBULATORY_CARE_PROVIDER_SITE_OTHER): Payer: Self-pay | Admitting: Nurse Practitioner

## 2024-07-20 MED ORDER — HYDROCODONE-ACETAMINOPHEN 5-325 MG PO TABS: 1.0000 | ORAL_TABLET | Freq: Four times a day (QID) | ORAL | 0 refills | Status: DC | PRN

## 2024-07-20 NOTE — Telephone Encounter (Signed)
 Hydrocodone  sent for the patient

## 2024-07-23 ENCOUNTER — Encounter (INDEPENDENT_AMBULATORY_CARE_PROVIDER_SITE_OTHER): Payer: Self-pay

## 2024-07-23 NOTE — Telephone Encounter (Signed)
 We can see if there is available time to move her up sooner

## 2024-07-24 ENCOUNTER — Other Ambulatory Visit (INDEPENDENT_AMBULATORY_CARE_PROVIDER_SITE_OTHER): Payer: Self-pay | Admitting: Nurse Practitioner

## 2024-07-24 ENCOUNTER — Telehealth (INDEPENDENT_AMBULATORY_CARE_PROVIDER_SITE_OTHER): Payer: Self-pay

## 2024-07-24 MED ORDER — OXYCODONE HCL 5 MG PO CAPS: 5.0000 mg | ORAL_CAPSULE | Freq: Four times a day (QID) | ORAL | 0 refills | Status: DC | PRN

## 2024-07-24 NOTE — Telephone Encounter (Signed)
 Patient has been notified with medical recommendations and verbalized understanding

## 2024-07-24 NOTE — Telephone Encounter (Signed)
 I have sent in oxycodone 

## 2024-07-24 NOTE — Telephone Encounter (Signed)
 Patient left a message stating that she having a lot pain with left leg and the Hydrocodone  is not helping with pain relief. Patient states that she is not having no pain with right leg. Patient is requesting for something different to be called into pharmacy. Patient had right le angio 07/14/24. Please Advise

## 2024-07-27 ENCOUNTER — Other Ambulatory Visit (INDEPENDENT_AMBULATORY_CARE_PROVIDER_SITE_OTHER): Payer: Self-pay | Admitting: Vascular Surgery

## 2024-07-27 DIAGNOSIS — Z9889 Other specified postprocedural states: Secondary | ICD-10-CM

## 2024-07-27 DIAGNOSIS — I739 Peripheral vascular disease, unspecified: Secondary | ICD-10-CM

## 2024-07-28 ENCOUNTER — Encounter (INDEPENDENT_AMBULATORY_CARE_PROVIDER_SITE_OTHER): Payer: Self-pay | Admitting: Vascular Surgery

## 2024-07-28 ENCOUNTER — Other Ambulatory Visit (INDEPENDENT_AMBULATORY_CARE_PROVIDER_SITE_OTHER)

## 2024-07-28 ENCOUNTER — Ambulatory Visit (INDEPENDENT_AMBULATORY_CARE_PROVIDER_SITE_OTHER): Admitting: Vascular Surgery

## 2024-07-28 VITALS — BP 142/91 | HR 73 | Resp 16 | Ht 65.0 in | Wt 186.8 lb

## 2024-07-28 DIAGNOSIS — Z9889 Other specified postprocedural states: Secondary | ICD-10-CM

## 2024-07-28 DIAGNOSIS — I739 Peripheral vascular disease, unspecified: Secondary | ICD-10-CM | POA: Diagnosis not present

## 2024-07-28 DIAGNOSIS — I70222 Atherosclerosis of native arteries of extremities with rest pain, left leg: Secondary | ICD-10-CM | POA: Diagnosis not present

## 2024-07-28 DIAGNOSIS — I10 Essential (primary) hypertension: Secondary | ICD-10-CM

## 2024-07-28 DIAGNOSIS — J42 Unspecified chronic bronchitis: Secondary | ICD-10-CM | POA: Diagnosis not present

## 2024-07-28 NOTE — H&P (View-Only) (Signed)
 Subjective:    Patient ID: Erin Hinton, female    DOB: 1946/11/18, 78 y.o.   MRN: [id number] Chief Complaint  Patient presents with   Follow-up    2 week follow up + ABI     Erin Hinton is a 78 year old female who returns to clinic today 2 weeks postoperative from:  Procedure(s) Performed:             1.  Introduction catheter into right lower extremity 3rd order catheter placement               2.    Contrast injection right lower extremity for distal runoff             3.  Percutaneous transluminal angioplasty and stent placement right superficial femoral artery to 5 mm             4.  Percutaneous transluminal angioplasty right posterior tibial artery to 2.5 mm             5.  Percutaneous transluminal angioplasty and stent placement right external iliac artery to 7 mm.               6.  Star close closure left common femoral arteriotomy   Patients Pre-operative Diagnosis was Atherosclerotic occlusive disease bilateral lower extremities with right lower extremity with rest pain and lifestyle limiting claudication.  She endorses that her right lower extremity is doing very well.  She does not have the cramping or the pain to her right leg that she wants had.  It is no longer life limiting.  However she now has the same symptoms on the left side.  She endorses the same thigh pain that starts in the back of her leg that radiates up through her hip on that left side.  It is lifestyle limiting as she is having pain at rest as well as ambulation.  She is no longer able to walk more than 300 feet without having to stop and rest due to her left leg pain.  Patient underwent arterial duplex ultrasounds of her bilateral lower extremities today.  Today's right ABI was 0.99, previous ABI was 0.77 Today's left ABI is 0.74, previous ABI was 1.01.  Patient was noted to have triphasic waveforms in her right lower extremity today while she had biphasic waveforms at the PTA and monophasic  waveforms at her dorsalis pedis.      Review of Systems  Constitutional: Negative.   Musculoskeletal:  Positive for gait problem.       Patient's right lower extremity pain has been relieved.  Patient's left lower extremity now painful at rest as well as while ambulating.  All other systems reviewed and are negative.      Objective:   Physical Exam Constitutional:      Appearance: Normal appearance. She is normal weight.  HENT:     Head: Normocephalic.  Eyes:     Pupils: Pupils are equal, round, and reactive to light.  Cardiovascular:     Rate and Rhythm: Normal rate and regular rhythm.     Pulses: Normal pulses.     Heart sounds: Normal heart sounds.  Pulmonary:     Effort: Pulmonary effort is normal.     Breath sounds: Normal breath sounds.  Abdominal:     General: Abdomen is flat. Bowel sounds are normal.     Palpations: Abdomen is soft.  Musculoskeletal:        General: Tenderness present.  Comments: Patient noted to have pain to her left thigh starting in the back of her leg radiating up through her left hip.  Skin:    General: Skin is warm and dry.     Capillary Refill: Capillary refill takes 2 to 3 seconds.  Neurological:     General: No focal deficit present.     Mental Status: She is alert and oriented to person, place, and time. Mental status is at baseline.     Motor: Weakness present.  Psychiatric:        Mood and Affect: Mood normal.        Behavior: Behavior normal.        Thought Content: Thought content normal.        Judgment: Judgment normal.     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 ER positive: 51-90%   Depression    Heart murmur    in her 30's and it is not a problem now   Hyperlipidemia    Hyperlipidemia    Hypertension    Osteopenia    Personal history of radiation therapy    Shortness of breath dyspnea    due to excercise only    Social History   Socioeconomic History   Marital status: Widowed    Spouse name: Not on file   Number of children: Not on file   Years of education: Not on file   Highest education level: Some college, no degree  Occupational History   Not on file  Tobacco Use   Smoking status: Former    Current packs/day: 0.00    Average packs/day: 0.5 packs/day for 45.0 years (22.5 ttl pk-yrs)    Types: Cigarettes, E-cigarettes    Start date: 01/21/1971    Quit date: 01/22/2016    Years since quitting: 8.5    Passive exposure: Past   Smokeless tobacco: Never   Tobacco comments:    also uses e-cigaretts on occasion  Vaping Use   Vaping status: Some Days   Substances: Nicotine, Flavoring  Substance and Sexual Activity   Alcohol use: No   Drug use: No   Sexual activity: Not Currently    Birth control/protection: None  Other Topics Concern   Not on file  Social History Narrative   Not on file   Social Drivers of Health   Financial Resource Strain: Low Risk  (02/22/2024)   Overall Financial Resource Strain (CARDIA)    Difficulty of Paying Living Expenses: Not hard at all  Food Insecurity: No Food Insecurity (02/22/2024)   Hunger Vital Sign    Worried About Running Out of Food in the Last Year: Never true    Ran Out of Food in the Last Year: Never true  Transportation Needs: No Transportation Needs (02/22/2024)   PRAPARE - Administrator, Civil Service (Medical): No    Lack of Transportation (Non-Medical): No  Physical Activity: Insufficiently Active (02/22/2024)  Exercise Vital Sign    Days of Exercise per Week: 2 days    Minutes of Exercise per Session: 20 min  Stress:  Stress Concern Present (02/22/2024)   [employer] of Occupational Health - Occupational Stress Questionnaire    Feeling of Stress : To some extent  Social Connections: Moderately Isolated (02/22/2024)   Social Connection and Isolation Panel    Frequency of Communication with Friends and Family: More than three times a week    Frequency of Social Gatherings with Friends and Family: Twice a week    Attends Religious Services: More than 4 times per year    Active Member of [employer] or Organizations: No    Attends Banker Meetings: Never    Marital Status: Widowed  Intimate Partner Violence: Not At Risk (02/10/2024)   Humiliation, Afraid, Rape, and Kick questionnaire    Fear of Current or Ex-Partner: No    Emotionally Abused: No    Physically Abused: No    Sexually Abused: No    Past Surgical History:  Procedure Laterality Date   BREAST BIOPSY Left 01/27/2019   Affirm Bx- Coil Clip- HIGH-GRADE DUCTAL CARCINOMA IN SITU, WITH COMEDONECROSIS.   BREAST EXCISIONAL BIOPSY Right 01/2002   neg. no lymph node involvement   BREAST EXCISIONAL BIOPSY Left 03/02/2019   lumpectomy DCIS   BREAST LUMPECTOMY Left 03/02/2019   left lumpectomy dcis with NL   BREAST LUMPECTOMY Left 03/02/2019   Procedure: BREAST WIDE EXCISION LEFT, WITH WIRE LOCATION;  Surgeon: Dessa Reyes ORN, MD;  Location: [facility];  Service: General;  Laterality: Left;   BREAST SURGERY  1998   breast biopsy DCIS   CATARACT EXTRACTION W/PHACO Right 05/01/2016   Procedure: CATARACT EXTRACTION PHACO AND INTRAOCULAR LENS PLACEMENT (IOC);  Surgeon: Elsie Carmine, MD;  Location: [facility];  Service: Ophthalmology;  Laterality: Right;  US  00:53 AP% 21.7 CDE 11.50 fluid pack lot # [id number]   CATARACT EXTRACTION W/PHACO Left 06/05/2016   Procedure: CATARACT EXTRACTION PHACO AND INTRAOCULAR LENS PLACEMENT (IOC);  Surgeon: Elsie Carmine, MD;  Location: [facility];  Service: Ophthalmology;  Laterality: Left;  US  01:04 AP%  22.2 CDE 14.34 fluid pack lot # [id number]   COLONOSCOPY WITH PROPOFOL  N/A 11/21/2015   Procedure: COLONOSCOPY WITH PROPOFOL ;  Surgeon: Deward CINDERELLA Piedmont, MD;  Location: [facility];  Service: Gastroenterology;  Laterality: N/A;   EYE SURGERY     Cataract   LOWER EXTREMITY ANGIOGRAPHY Right 07/14/2024   Procedure: Lower Extremity Angiography;  Surgeon: Jama Cordella MATSU, MD;  Location: [facility];  Service: Cardiovascular;  Laterality: Right;   LOWER EXTREMITY INTERVENTION Right 07/14/2024   Procedure: LOWER EXTREMITY INTERVENTION;  Surgeon: Jama Cordella MATSU, MD;  Location: [facility];  Service: Cardiovascular;  Laterality: Right;   SKIN BIOPSY  01/01/2023   Upper Back    Family History  Problem Relation Age of Onset   Ovarian cancer Mother    Heart disease Sister    Lung cancer Sister    Lymphoma Father    Heart disease Father        MI   Hypertension Father    Cancer Father    Alcohol abuse Brother    Lung cancer Brother    Breast cancer Sister 69   Heart disease Sister    Heart attack Sister    Breast cancer Daughter 64       Charleen Ee, LCIS   Breast cancer Paternal Aunt 55   Colon cancer Neg Hx  Allergies  Allergen Reactions   Alendronate Sodium     GI pain   Atorvastatin     Muscle pain   Crestor  [Rosuvastatin  Calcium ]     Muscle pain   Evista [Raloxifene] Other (See Comments)    Breast tenderness   Rosuvastatin  Other (See Comments)   Sertraline Hcl     Did not help, made anxiety worse   Pseudoephedrine Palpitations       Latest Ref Rng & Units 02/26/2024   11:21 AM 02/20/2023   11:06 AM 01/30/2022   11:24 AM  CBC  WBC 4.0 - 10.5 K/uL 6.1  5.4  5.1   Hemoglobin 12.0 - 15.0 g/dL 85.9  86.5  86.6   Hematocrit 36.0 - 46.0 % 41.9  40.3  40.0   Platelets 150.0 - 400.0 K/uL 232.0  235.0  223.0       CMP     Component Value Date/Time   NA 144 05/13/2024 1046   K 4.3 05/13/2024 1046   CL 103 05/13/2024 1046   CO2 24 05/13/2024 1046    GLUCOSE 99 05/13/2024 1046   GLUCOSE 98 02/26/2024 1121   BUN 18 07/14/2024 0907   BUN 15 05/13/2024 1046   CREATININE 0.82 07/14/2024 0907   CALCIUM  10.3 05/13/2024 1046   PROT 7.0 02/26/2024 1121   ALBUMIN 4.6 02/26/2024 1121   AST 20 02/26/2024 1121   ALT 15 02/26/2024 1121   ALKPHOS 62 02/26/2024 1121   BILITOT 0.5 02/26/2024 1121   GFR 76.57 02/26/2024 1121   EGFR 77 05/13/2024 1046   GFRNONAA >60 07/14/2024 0907     No results found.     Assessment & Plan:   1. Atherosclerosis of native artery of left lower extremity with rest pain (HCC) (Primary) Recommend:  The patient is status post successful angiogram with intervention.  The patient reports that the claudication symptoms and leg pain has improved.   The patient denies lifestyle limiting changes at this point in time.  No further invasive studies, angiography or surgery at this time to her right lower extremity. The patient should continue walking and begin a more formal exercise program.  The patient should continue antiplatelet therapy and aggressive treatment of the lipid abnormalities  Continued surveillance is indicated as atherosclerosis is likely to progress with time.    Patient should undergo noninvasive studies as ordered. The patient will follow up with me to review the studies.   However  Recommend:  The patient has evidence of severe atherosclerotic changes of both lower extremities with rest pain that is associated with preulcerative changes and impending tissue loss of the left foot.  This represents a limb threatening ischemia and places the patient at the risk for left limb loss.  Patient should undergo angiography of the left lower extremity with the hope for intervention for limb salvage.  The risks and benefits as well as the alternative therapies was discussed in detail with the patient.  All questions were answered.  Patient agrees to proceed with left lower extremity angiography.  The  patient will follow up with me in the office after the procedure.      2. HYPERTENSION, BENIGN ESSENTIAL Continue antihypertensive medications as already ordered, these medications have been reviewed and there are no changes at this time.  3. Chronic bronchitis, unspecified chronic bronchitis type (HCC) Continue pulmonary medications and aerosols as already ordered, these medications have been reviewed and there are no changes at this time.    Current Outpatient Medications  on File Prior to Visit  Medication Sig Dispense Refill   acetaminophen  (TYLENOL ) 500 MG tablet Take 500 mg by mouth every 6 (six) hours as needed (for pain.).     albuterol  (VENTOLIN  HFA) 108 (90 Base) MCG/ACT inhaler Inhale 2 puffs into the lungs every 4 (four) hours as needed for wheezing or shortness of breath. 18 g 3   Ascorbic Acid (VITAMIN C WITH ROSE HIPS) 1000 MG tablet Take 1,000 mg by mouth daily.     aspirin  EC 81 MG tablet Take 1 tablet (81 mg total) by mouth daily. Swallow whole.     Calcium  Carb-Cholecalciferol (CALCIUM  600 + D PO) Take 1 capsule by mouth daily.     Calcium -Magnesium-Zinc (CAL-MAG-ZINC PO) Take 1 tablet by mouth daily.     Cholecalciferol (VITAMIN D ) 50 MCG (2000 UT) tablet Take 2,000 Units by mouth daily.     clopidogrel  (PLAVIX ) 75 MG tablet Take 1 tablet (75 mg total) by mouth daily. 30 tablet 5   escitalopram  (LEXAPRO ) 20 MG tablet TAKE 1 TABLET BY MOUTH EVERY DAY 90 tablet 2   Evolocumab  (REPATHA  SURECLICK) 140 MG/ML SOAJ Inject 140 mg into the skin every 14 (fourteen) days. 6 mL 3   fluticasone  (FLONASE ) 50 MCG/ACT nasal spray Place 2 sprays into both nostrils daily as needed for allergies. 48 mL 1   fluticasone -salmeterol (WIXELA INHUB) 250-50 MCG/ACT AEPB INHALE 1 PUFF INTO THE LUNGS IN THE MORNING AND AT BEDTIME. 60 each 2   hydrochlorothiazide  (HYDRODIURIL ) 25 MG tablet TAKE 1 TABLET BY MOUTH EVERY DAY 90 tablet 2   HYDROcodone -acetaminophen  (NORCO/VICODIN) 5-325 MG tablet Take 1  tablet by mouth every 6 (six) hours as needed for moderate pain (pain score 4-6). (Patient not taking: Reported on 07/28/2024) 30 tablet 0   Omega-3 Fatty Acids (FISH OIL) 1000 MG CAPS Take 1,000 mg by mouth daily.     oxycodone  (OXY-IR) 5 MG capsule Take 1 capsule (5 mg total) by mouth every 6 (six) hours as needed. 20 capsule 0   vitamin E 1000 UNIT capsule Take 1 capsule by mouth daily.     No current facility-administered medications on file prior to visit.    There are no Patient Instructions on file for this visit. No follow-ups on file.   Gwendlyn JONELLE Shank, NP

## 2024-07-28 NOTE — Progress Notes (Signed)
 Subjective:    Patient ID: Erin Hinton, female    DOB: December 06, 1946, 78 y.o.   MRN: [id number] Chief Complaint  Patient presents with   Follow-up    2 week follow up + ABI     Erin Hinton is a 78 year old female who returns to clinic today 2 weeks postoperative from:  Procedure(s) Performed:             1.  Introduction catheter into right lower extremity 3rd order catheter placement               2.    Contrast injection right lower extremity for distal runoff             3.  Percutaneous transluminal angioplasty and stent placement right superficial femoral artery to 5 mm             4.  Percutaneous transluminal angioplasty right posterior tibial artery to 2.5 mm             5.  Percutaneous transluminal angioplasty and stent placement right external iliac artery to 7 mm.               6.  Star close closure left common femoral arteriotomy   Patients Pre-operative Diagnosis was Atherosclerotic occlusive disease bilateral lower extremities with right lower extremity with rest pain and lifestyle limiting claudication.  She endorses that her right lower extremity is doing very well.  She does not have the cramping or the pain to her right leg that she wants had.  It is no longer life limiting.  However she now has the same symptoms on the left side.  She endorses the same thigh pain that starts in the back of her leg that radiates up through her hip on that left side.  It is lifestyle limiting as she is having pain at rest as well as ambulation.  She is no longer able to walk more than 300 feet without having to stop and rest due to her left leg pain.  Patient underwent arterial duplex ultrasounds of her bilateral lower extremities today.  Today's right ABI was 0.99, previous ABI was 0.77 Today's left ABI is 0.74, previous ABI was 1.01.  Patient was noted to have triphasic waveforms in her right lower extremity today while she had biphasic waveforms at the PTA and monophasic  waveforms at her dorsalis pedis.      Review of Systems  Constitutional: Negative.   Musculoskeletal:  Positive for gait problem.       Patient's right lower extremity pain has been relieved.  Patient's left lower extremity now painful at rest as well as while ambulating.  All other systems reviewed and are negative.      Objective:   Physical Exam Constitutional:      Appearance: Normal appearance. She is normal weight.  HENT:     Head: Normocephalic.  Eyes:     Pupils: Pupils are equal, round, and reactive to light.  Cardiovascular:     Rate and Rhythm: Normal rate and regular rhythm.     Pulses: Normal pulses.     Heart sounds: Normal heart sounds.  Pulmonary:     Effort: Pulmonary effort is normal.     Breath sounds: Normal breath sounds.  Abdominal:     General: Abdomen is flat. Bowel sounds are normal.     Palpations: Abdomen is soft.  Musculoskeletal:        General: Tenderness present.  Comments: Patient noted to have pain to her left thigh starting in the back of her leg radiating up through her left hip.  Skin:    General: Skin is warm and dry.     Capillary Refill: Capillary refill takes 2 to 3 seconds.  Neurological:     General: No focal deficit present.     Mental Status: She is alert and oriented to person, place, and time. Mental status is at baseline.     Motor: Weakness present.  Psychiatric:        Mood and Affect: Mood normal.        Behavior: Behavior normal.        Thought Content: Thought content normal.        Judgment: Judgment normal.     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 ER positive: 51-90%   Depression    Heart murmur    in her 30's and it is not a problem now   Hyperlipidemia    Hyperlipidemia    Hypertension    Osteopenia    Personal history of radiation therapy    Shortness of breath dyspnea    due to excercise only    Social History   Socioeconomic History   Marital status: Widowed    Spouse name: Not on file   Number of children: Not on file   Years of education: Not on file   Highest education level: Some college, no degree  Occupational History   Not on file  Tobacco Use   Smoking status: Former    Current packs/day: 0.00    Average packs/day: 0.5 packs/day for 45.0 years (22.5 ttl pk-yrs)    Types: Cigarettes, E-cigarettes    Start date: 01/21/1971    Quit date: 01/22/2016    Years since quitting: 8.5    Passive exposure: Past   Smokeless tobacco: Never   Tobacco comments:    also uses e-cigaretts on occasion  Vaping Use   Vaping status: Some Days   Substances: Nicotine, Flavoring  Substance and Sexual Activity   Alcohol use: No   Drug use: No   Sexual activity: Not Currently    Birth control/protection: None  Other Topics Concern   Not on file  Social History Narrative   Not on file   Social Drivers of Health   Financial Resource Strain: Low Risk  (02/22/2024)   Overall Financial Resource Strain (CARDIA)    Difficulty of Paying Living Expenses: Not hard at all  Food Insecurity: No Food Insecurity (02/22/2024)   Hunger Vital Sign    Worried About Running Out of Food in the Last Year: Never true    Ran Out of Food in the Last Year: Never true  Transportation Needs: No Transportation Needs (02/22/2024)   PRAPARE - Administrator, Civil Service (Medical): No    Lack of Transportation (Non-Medical): No  Physical Activity: Insufficiently Active (02/22/2024)  Exercise Vital Sign    Days of Exercise per Week: 2 days    Minutes of Exercise per Session: 20 min  Stress:  Stress Concern Present (02/22/2024)   [employer] of Occupational Health - Occupational Stress Questionnaire    Feeling of Stress : To some extent  Social Connections: Moderately Isolated (02/22/2024)   Social Connection and Isolation Panel    Frequency of Communication with Friends and Family: More than three times a week    Frequency of Social Gatherings with Friends and Family: Twice a week    Attends Religious Services: More than 4 times per year    Active Member of [employer] or Organizations: No    Attends Banker Meetings: Never    Marital Status: Widowed  Intimate Partner Violence: Not At Risk (02/10/2024)   Humiliation, Afraid, Rape, and Kick questionnaire    Fear of Current or Ex-Partner: No    Emotionally Abused: No    Physically Abused: No    Sexually Abused: No    Past Surgical History:  Procedure Laterality Date   BREAST BIOPSY Left 01/27/2019   Affirm Bx- Coil Clip- HIGH-GRADE DUCTAL CARCINOMA IN SITU, WITH COMEDONECROSIS.   BREAST EXCISIONAL BIOPSY Right 01/2002   neg. no lymph node involvement   BREAST EXCISIONAL BIOPSY Left 03/02/2019   lumpectomy DCIS   BREAST LUMPECTOMY Left 03/02/2019   left lumpectomy dcis with NL   BREAST LUMPECTOMY Left 03/02/2019   Procedure: BREAST WIDE EXCISION LEFT, WITH WIRE LOCATION;  Surgeon: Dessa Reyes ORN, MD;  Location: [facility] ORS;  Service: General;  Laterality: Left;   BREAST SURGERY  1998   breast biopsy DCIS   CATARACT EXTRACTION W/PHACO Right 05/01/2016   Procedure: CATARACT EXTRACTION PHACO AND INTRAOCULAR LENS PLACEMENT (IOC);  Surgeon: Elsie Carmine, MD;  Location: [facility];  Service: Ophthalmology;  Laterality: Right;  US  00:53 AP% 21.7 CDE 11.50 fluid pack lot # [id number]   CATARACT EXTRACTION W/PHACO Left 06/05/2016   Procedure: CATARACT EXTRACTION PHACO AND INTRAOCULAR LENS PLACEMENT (IOC);  Surgeon: Elsie Carmine, MD;  Location: [facility];  Service: Ophthalmology;  Laterality: Left;  US  01:04 AP%  22.2 CDE 14.34 fluid pack lot # [id number]   COLONOSCOPY WITH PROPOFOL  N/A 11/21/2015   Procedure: COLONOSCOPY WITH PROPOFOL ;  Surgeon: Deward CINDERELLA Piedmont, MD;  Location: [facility] ENDOSCOPY;  Service: Gastroenterology;  Laterality: N/A;   EYE SURGERY     Cataract   LOWER EXTREMITY ANGIOGRAPHY Right 07/14/2024   Procedure: Lower Extremity Angiography;  Surgeon: Jama Cordella MATSU, MD;  Location: [facility] INVAS[facility];  Service: Cardiovascular;  Laterality: Right;   LOWER EXTREMITY INTERVENTION Right 07/14/2024   Procedure: LOWER EXTREMITY INTERVENTION;  Surgeon: Jama Cordella MATSU, MD;  Location: [facility] CV [facility];  Service: Cardiovascular;  Laterality: Right;   SKIN BIOPSY  01/01/2023   Upper Back    Family History  Problem Relation Age of Onset   Ovarian cancer Mother    Heart disease Sister    Lung cancer Sister    Lymphoma Father    Heart disease Father        MI   Hypertension Father    Cancer Father    Alcohol abuse Brother    Lung cancer Brother    Breast cancer Sister 69   Heart disease Sister    Heart attack Sister    Breast cancer Daughter 64       Charleen Ee, LCIS   Breast cancer Paternal Aunt 55   Colon cancer Neg Hx  Allergies  Allergen Reactions   Alendronate Sodium     GI pain   Atorvastatin     Muscle pain   Crestor  [Rosuvastatin  Calcium ]     Muscle pain   Evista [Raloxifene] Other (See Comments)    Breast tenderness   Rosuvastatin  Other (See Comments)   Sertraline Hcl     Did not help, made anxiety worse   Pseudoephedrine Palpitations       Latest Ref Rng & Units 02/26/2024   11:21 AM 02/20/2023   11:06 AM 01/30/2022   11:24 AM  CBC  WBC 4.0 - 10.5 K/uL 6.1  5.4  5.1   Hemoglobin 12.0 - 15.0 g/dL 85.9  86.5  86.6   Hematocrit 36.0 - 46.0 % 41.9  40.3  40.0   Platelets 150.0 - 400.0 K/uL 232.0  235.0  223.0       CMP     Component Value Date/Time   NA 144 05/13/2024 1046   K 4.3 05/13/2024 1046   CL 103 05/13/2024 1046   CO2 24 05/13/2024 1046    GLUCOSE 99 05/13/2024 1046   GLUCOSE 98 02/26/2024 1121   BUN 18 07/14/2024 0907   BUN 15 05/13/2024 1046   CREATININE 0.82 07/14/2024 0907   CALCIUM  10.3 05/13/2024 1046   PROT 7.0 02/26/2024 1121   ALBUMIN 4.6 02/26/2024 1121   AST 20 02/26/2024 1121   ALT 15 02/26/2024 1121   ALKPHOS 62 02/26/2024 1121   BILITOT 0.5 02/26/2024 1121   GFR 76.57 02/26/2024 1121   EGFR 77 05/13/2024 1046   GFRNONAA >60 07/14/2024 0907     No results found.     Assessment & Plan:   1. Atherosclerosis of native artery of left lower extremity with rest pain (HCC) (Primary) Recommend:  The patient is status post successful angiogram with intervention.  The patient reports that the claudication symptoms and leg pain has improved.   The patient denies lifestyle limiting changes at this point in time.  No further invasive studies, angiography or surgery at this time to her right lower extremity. The patient should continue walking and begin a more formal exercise program.  The patient should continue antiplatelet therapy and aggressive treatment of the lipid abnormalities  Continued surveillance is indicated as atherosclerosis is likely to progress with time.    Patient should undergo noninvasive studies as ordered. The patient will follow up with me to review the studies.   However  Recommend:  The patient has evidence of severe atherosclerotic changes of both lower extremities with rest pain that is associated with preulcerative changes and impending tissue loss of the left foot.  This represents a limb threatening ischemia and places the patient at the risk for left limb loss.  Patient should undergo angiography of the left lower extremity with the hope for intervention for limb salvage.  The risks and benefits as well as the alternative therapies was discussed in detail with the patient.  All questions were answered.  Patient agrees to proceed with left lower extremity angiography.  The  patient will follow up with me in the office after the procedure.      2. HYPERTENSION, BENIGN ESSENTIAL Continue antihypertensive medications as already ordered, these medications have been reviewed and there are no changes at this time.  3. Chronic bronchitis, unspecified chronic bronchitis type (HCC) Continue pulmonary medications and aerosols as already ordered, these medications have been reviewed and there are no changes at this time.    Current Outpatient Medications  on File Prior to Visit  Medication Sig Dispense Refill   acetaminophen  (TYLENOL ) 500 MG tablet Take 500 mg by mouth every 6 (six) hours as needed (for pain.).     albuterol  (VENTOLIN  HFA) 108 (90 Base) MCG/ACT inhaler Inhale 2 puffs into the lungs every 4 (four) hours as needed for wheezing or shortness of breath. 18 g 3   Ascorbic Acid (VITAMIN C WITH ROSE HIPS) 1000 MG tablet Take 1,000 mg by mouth daily.     aspirin  EC 81 MG tablet Take 1 tablet (81 mg total) by mouth daily. Swallow whole.     Calcium  Carb-Cholecalciferol (CALCIUM  600 + D PO) Take 1 capsule by mouth daily.     Calcium -Magnesium-Zinc (CAL-MAG-ZINC PO) Take 1 tablet by mouth daily.     Cholecalciferol (VITAMIN D ) 50 MCG (2000 UT) tablet Take 2,000 Units by mouth daily.     clopidogrel  (PLAVIX ) 75 MG tablet Take 1 tablet (75 mg total) by mouth daily. 30 tablet 5   escitalopram  (LEXAPRO ) 20 MG tablet TAKE 1 TABLET BY MOUTH EVERY DAY 90 tablet 2   Evolocumab  (REPATHA  SURECLICK) 140 MG/ML SOAJ Inject 140 mg into the skin every 14 (fourteen) days. 6 mL 3   fluticasone  (FLONASE ) 50 MCG/ACT nasal spray Place 2 sprays into both nostrils daily as needed for allergies. 48 mL 1   fluticasone -salmeterol (WIXELA INHUB) 250-50 MCG/ACT AEPB INHALE 1 PUFF INTO THE LUNGS IN THE MORNING AND AT BEDTIME. 60 each 2   hydrochlorothiazide  (HYDRODIURIL ) 25 MG tablet TAKE 1 TABLET BY MOUTH EVERY DAY 90 tablet 2   HYDROcodone -acetaminophen  (NORCO/VICODIN) 5-325 MG tablet Take 1  tablet by mouth every 6 (six) hours as needed for moderate pain (pain score 4-6). (Patient not taking: Reported on 07/28/2024) 30 tablet 0   Omega-3 Fatty Acids (FISH OIL) 1000 MG CAPS Take 1,000 mg by mouth daily.     oxycodone  (OXY-IR) 5 MG capsule Take 1 capsule (5 mg total) by mouth every 6 (six) hours as needed. 20 capsule 0   vitamin E 1000 UNIT capsule Take 1 capsule by mouth daily.     No current facility-administered medications on file prior to visit.    There are no Patient Instructions on file for this visit. No follow-ups on file.   Gwendlyn JONELLE Shank, NP

## 2024-07-30 ENCOUNTER — Other Ambulatory Visit (INDEPENDENT_AMBULATORY_CARE_PROVIDER_SITE_OTHER): Payer: Self-pay | Admitting: Nurse Practitioner

## 2024-07-30 LAB — VAS US ABI WITH/WO TBI
Left ABI: 0.74
Right ABI: 0.99

## 2024-07-30 MED ORDER — OXYCODONE HCL 5 MG PO CAPS: 5.0000 mg | ORAL_CAPSULE | Freq: Four times a day (QID) | ORAL | 0 refills | Status: DC | PRN

## 2024-07-30 NOTE — Telephone Encounter (Signed)
 She is asking about an update regarding Angio

## 2024-08-03 ENCOUNTER — Telehealth (INDEPENDENT_AMBULATORY_CARE_PROVIDER_SITE_OTHER): Payer: Self-pay

## 2024-08-03 NOTE — Telephone Encounter (Signed)
 Spoke with the patient and she is scheduled with Dr. Jama on 08/11/24 with a 1:00 pm arrival time to the [facility] for a LLE angio. Pre-procedure instructions were discussed and will be sent to Mychart and mailed.

## 2024-08-04 ENCOUNTER — Encounter (INDEPENDENT_AMBULATORY_CARE_PROVIDER_SITE_OTHER)

## 2024-08-04 ENCOUNTER — Ambulatory Visit (INDEPENDENT_AMBULATORY_CARE_PROVIDER_SITE_OTHER): Admitting: Nurse Practitioner

## 2024-08-11 ENCOUNTER — Other Ambulatory Visit: Payer: Self-pay

## 2024-08-11 ENCOUNTER — Encounter
Admission: RE | Disposition: A | Discharge status: Home / Self Care | Payer: Self-pay | Source: Home / Self Care | Attending: Vascular Surgery

## 2024-08-11 ENCOUNTER — Observation Stay
Admission: RE | Admit: 2024-08-11 | Discharge: 2024-08-12 | Disposition: A | Discharge status: Home / Self Care | Attending: Vascular Surgery | Admitting: Vascular Surgery

## 2024-08-11 ENCOUNTER — Encounter: Payer: Self-pay | Admitting: Vascular Surgery

## 2024-08-11 DIAGNOSIS — J42 Unspecified chronic bronchitis: Secondary | ICD-10-CM | POA: Diagnosis not present

## 2024-08-11 DIAGNOSIS — Z7982 Long term (current) use of aspirin: Secondary | ICD-10-CM | POA: Diagnosis not present

## 2024-08-11 DIAGNOSIS — Z87891 Personal history of nicotine dependence: Secondary | ICD-10-CM | POA: Insufficient documentation

## 2024-08-11 DIAGNOSIS — L97929 Non-pressure chronic ulcer of unspecified part of left lower leg with unspecified severity: Secondary | ICD-10-CM

## 2024-08-11 DIAGNOSIS — I700 Atherosclerosis of aorta: Secondary | ICD-10-CM | POA: Diagnosis not present

## 2024-08-11 DIAGNOSIS — I70222 Atherosclerosis of native arteries of extremities with rest pain, left leg: Principal | ICD-10-CM | POA: Insufficient documentation

## 2024-08-11 DIAGNOSIS — I70249 Atherosclerosis of native arteries of left leg with ulceration of unspecified site: Secondary | ICD-10-CM

## 2024-08-11 DIAGNOSIS — Z79899 Other long term (current) drug therapy: Secondary | ICD-10-CM | POA: Insufficient documentation

## 2024-08-11 DIAGNOSIS — I10 Essential (primary) hypertension: Secondary | ICD-10-CM | POA: Diagnosis not present

## 2024-08-11 DIAGNOSIS — I70229 Atherosclerosis of native arteries of extremities with rest pain, unspecified extremity: Principal | ICD-10-CM | POA: Diagnosis present

## 2024-08-11 HISTORY — PX: LOWER EXTREMITY INTERVENTION: CATH118252

## 2024-08-11 HISTORY — PX: LOWER EXTREMITY ANGIOGRAPHY: CATH118251

## 2024-08-11 LAB — CBC
HCT: 34 % — ABNORMAL LOW (ref 36.0–46.0)
Hemoglobin: 11.3 g/dL — ABNORMAL LOW (ref 12.0–15.0)
MCH: 33.8 pg (ref 26.0–34.0)
MCHC: 33.2 g/dL (ref 30.0–36.0)
MCV: 101.8 fL — ABNORMAL HIGH (ref 80.0–100.0)
Platelets: 198 K/uL (ref 150–400)
RBC: 3.34 MIL/uL — ABNORMAL LOW (ref 3.87–5.11)
RDW: 13 % (ref 11.5–15.5)
WBC: 13 K/uL — ABNORMAL HIGH (ref 4.0–10.5)
nRBC: 0 % (ref 0.0–0.2)

## 2024-08-11 LAB — CREATININE, SERUM
Creatinine, Ser: 0.89 mg/dL (ref 0.44–1.00)
GFR, Estimated: >60 mL/min (ref >60)

## 2024-08-11 LAB — BUN: BUN: 16 mg/dL (ref 8–23)

## 2024-08-11 SURGERY — LOWER EXTREMITY INTERVENTION
Anesthesia: Moderate Sedation | Site: Leg Lower | Laterality: Left

## 2024-08-11 MED ORDER — SODIUM CHLORIDE 0.9 % IV SOLN: INTRAVENOUS | Status: DC

## 2024-08-11 MED ORDER — ACETAMINOPHEN 650 MG RE SUPP: 650.0000 mg | Freq: Four times a day (QID) | RECTAL | Status: DC | PRN

## 2024-08-11 MED ORDER — SODIUM CHLORIDE 0.9% FLUSH: 3.0000 mL | INTRAVENOUS | Status: DC | PRN

## 2024-08-11 MED ORDER — IODIXANOL 320 MG/ML IV SOLN
INTRAVENOUS | Status: DC | PRN
  Administered 2024-08-11: 70 mL via INTRA_ARTERIAL

## 2024-08-11 MED ORDER — CHLORHEXIDINE GLUCONATE CLOTH 2 % EX PADS
6.0000 | MEDICATED_PAD | Freq: Every day | CUTANEOUS | Status: DC
  Administered 2024-08-12: 6 via TOPICAL

## 2024-08-11 MED ORDER — ESCITALOPRAM OXALATE 10 MG PO TABS
20.0000 mg | ORAL_TABLET | Freq: Every day | ORAL | Status: DC
  Administered 2024-08-11: 20 mg via ORAL
  Filled 2024-08-11 (×2): qty 2

## 2024-08-11 MED ORDER — HYDROCHLOROTHIAZIDE 25 MG PO TABS
25.0000 mg | ORAL_TABLET | Freq: Every day | ORAL | Status: DC
  Administered 2024-08-11 – 2024-08-12 (×2): 25 mg via ORAL
  Filled 2024-08-11 (×2): qty 1

## 2024-08-11 MED ORDER — ONDANSETRON HCL 4 MG/2ML IJ SOLN: 4.0000 mg | Freq: Four times a day (QID) | INTRAMUSCULAR | Status: DC | PRN

## 2024-08-11 MED ORDER — LABETALOL HCL 5 MG/ML IV SOLN: 10.0000 mg | INTRAVENOUS | Status: DC | PRN

## 2024-08-11 MED ORDER — MIDAZOLAM HCL 2 MG/ML PO SYRP: 8.0000 mg | ORAL_SOLUTION | Freq: Once | ORAL | Status: DC | PRN

## 2024-08-11 MED ORDER — ONDANSETRON HCL 4 MG/2ML IJ SOLN
INTRAMUSCULAR | Status: AC
  Filled 2024-08-11: qty 2

## 2024-08-11 MED ORDER — FLUTICASONE PROPIONATE 50 MCG/ACT NA SUSP: 2.0000 | Freq: Every day | NASAL | Status: DC | PRN

## 2024-08-11 MED ORDER — CEFAZOLIN SODIUM-DEXTROSE 2-4 GM/100ML-% IV SOLN
2.0000 g | INTRAVENOUS | Status: AC
Start: 2024-08-11 — End: 2024-08-12
  Administered 2024-08-11: 2 g via INTRAVENOUS

## 2024-08-11 MED ORDER — SODIUM CHLORIDE 0.9 % IV SOLN: INTRAVENOUS | Status: AC

## 2024-08-11 MED ORDER — HEPARIN SODIUM (PORCINE) 1000 UNIT/ML IJ SOLN
INTRAMUSCULAR | Status: AC
Start: 2024-08-11 — End: 2024-08-11
  Filled 2024-08-11: qty 10

## 2024-08-11 MED ORDER — CLOPIDOGREL BISULFATE 75 MG PO TABS
75.0000 mg | ORAL_TABLET | Freq: Every day | ORAL | Status: DC
  Administered 2024-08-11 – 2024-08-12 (×2): 75 mg via ORAL
  Filled 2024-08-11 (×2): qty 1

## 2024-08-11 MED ORDER — OXYCODONE HCL 5 MG PO TABS: 5.0000 mg | ORAL_TABLET | ORAL | Status: DC | PRN

## 2024-08-11 MED ORDER — SODIUM CHLORIDE 0.9 % IV SOLN: 250.0000 mL | INTRAVENOUS | Status: DC | PRN

## 2024-08-11 MED ORDER — DIPHENHYDRAMINE HCL 50 MG/ML IJ SOLN: 50.0000 mg | Freq: Once | INTRAMUSCULAR | Status: DC | PRN

## 2024-08-11 MED ORDER — ALBUTEROL SULFATE HFA 108 (90 BASE) MCG/ACT IN AERS: 2.0000 | INHALATION_SPRAY | RESPIRATORY_TRACT | Status: DC | PRN

## 2024-08-11 MED ORDER — SODIUM CHLORIDE 0.9% FLUSH
3.0000 mL | Freq: Two times a day (BID) | INTRAVENOUS | Status: DC
  Administered 2024-08-11 – 2024-08-12 (×2): 3 mL via INTRAVENOUS

## 2024-08-11 MED ORDER — LIDOCAINE HCL (PF) 1 % IJ SOLN
INTRAMUSCULAR | Status: DC | PRN
  Administered 2024-08-11: 10 mL via INTRADERMAL

## 2024-08-11 MED ORDER — MIDAZOLAM HCL 2 MG/2ML IJ SOLN
INTRAMUSCULAR | Status: DC | PRN
  Administered 2024-08-11: 2 mg via INTRAVENOUS
  Administered 2024-08-11 (×2): 1 mg via INTRAVENOUS

## 2024-08-11 MED ORDER — CEFAZOLIN SODIUM-DEXTROSE 2-4 GM/100ML-% IV SOLN
INTRAVENOUS | Status: AC
Start: 2024-08-11 — End: 2024-08-11
  Filled 2024-08-11: qty 100

## 2024-08-11 MED ORDER — ZOLPIDEM TARTRATE 5 MG PO TABS: 5.0000 mg | ORAL_TABLET | Freq: Every evening | ORAL | Status: DC | PRN

## 2024-08-11 MED ORDER — FLUTICASONE FUROATE-VILANTEROL 200-25 MCG/ACT IN AEPB
1.0000 | INHALATION_SPRAY | Freq: Every day | RESPIRATORY_TRACT | Status: DC
  Administered 2024-08-12: 1 via RESPIRATORY_TRACT
  Filled 2024-08-11: qty 28

## 2024-08-11 MED ORDER — HYDROMORPHONE HCL 1 MG/ML IJ SOLN: 1.0000 mg | Freq: Once | INTRAMUSCULAR | Status: DC | PRN

## 2024-08-11 MED ORDER — FAMOTIDINE 20 MG PO TABS: 40.0000 mg | ORAL_TABLET | Freq: Once | ORAL | Status: DC | PRN

## 2024-08-11 MED ORDER — HEPARIN (PORCINE) IN NACL 1000-0.9 UT/500ML-% IV SOLN
INTRAVENOUS | Status: DC | PRN
Start: 2024-08-11 — End: 2024-08-11
  Administered 2024-08-11: 1000 mL

## 2024-08-11 MED ORDER — FENTANYL CITRATE (PF) 100 MCG/2ML IJ SOLN
INTRAMUSCULAR | Status: DC | PRN
  Administered 2024-08-11: 25 ug via INTRAVENOUS
  Administered 2024-08-11: 12.5 ug via INTRAVENOUS
  Administered 2024-08-11: 50 ug via INTRAVENOUS

## 2024-08-11 MED ORDER — MIDAZOLAM HCL 5 MG/5ML IJ SOLN
INTRAMUSCULAR | Status: AC
  Filled 2024-08-11: qty 5

## 2024-08-11 MED ORDER — HEPARIN SODIUM (PORCINE) 1000 UNIT/ML IJ SOLN
INTRAMUSCULAR | Status: DC | PRN
  Administered 2024-08-11: 6000 [IU] via INTRAVENOUS

## 2024-08-11 MED ORDER — ALBUTEROL SULFATE (2.5 MG/3ML) 0.083% IN NEBU: 2.5000 mg | INHALATION_SOLUTION | RESPIRATORY_TRACT | Status: DC | PRN

## 2024-08-11 MED ORDER — ASPIRIN 81 MG PO TBEC
81.0000 mg | DELAYED_RELEASE_TABLET | Freq: Every day | ORAL | Status: DC
  Administered 2024-08-11 – 2024-08-12 (×2): 81 mg via ORAL
  Filled 2024-08-11 (×2): qty 1

## 2024-08-11 MED ORDER — HYDRALAZINE HCL 20 MG/ML IJ SOLN
INTRAMUSCULAR | Status: AC
  Filled 2024-08-11: qty 1

## 2024-08-11 MED ORDER — FENTANYL CITRATE (PF) 100 MCG/2ML IJ SOLN
INTRAMUSCULAR | Status: AC
  Filled 2024-08-11: qty 2

## 2024-08-11 MED ORDER — SORBITOL 70 % SOLN: 30.0000 mL | Freq: Every day | Status: DC | PRN

## 2024-08-11 MED ORDER — HYDRALAZINE HCL 20 MG/ML IJ SOLN
5.0000 mg | INTRAMUSCULAR | Status: DC | PRN
  Administered 2024-08-11: 5 mg via INTRAVENOUS

## 2024-08-11 MED ORDER — MORPHINE SULFATE (PF) 4 MG/ML IV SOLN: 4.0000 mg | INTRAVENOUS | Status: DC | PRN

## 2024-08-11 MED ORDER — POLYETHYLENE GLYCOL 3350 17 G PO PACK: 17.0000 g | PACK | Freq: Every day | ORAL | Status: DC | PRN

## 2024-08-11 MED ORDER — ACETAMINOPHEN 325 MG PO TABS: 650.0000 mg | ORAL_TABLET | Freq: Four times a day (QID) | ORAL | Status: DC | PRN

## 2024-08-11 MED ORDER — METHYLPREDNISOLONE SODIUM SUCC 125 MG IJ SOLR: 125.0000 mg | Freq: Once | INTRAMUSCULAR | Status: DC | PRN

## 2024-08-11 MED ORDER — ACETAMINOPHEN 325 MG PO TABS: 650.0000 mg | ORAL_TABLET | ORAL | Status: DC | PRN

## 2024-08-11 MED ORDER — MORPHINE SULFATE (PF) 2 MG/ML IV SOLN: 2.0000 mg | INTRAVENOUS | Status: DC | PRN

## 2024-08-11 SURGICAL SUPPLY — 21 items
BALLOON LUTONIX DCB 5X60X130 (BALLOONS) IMPLANT
BALLOON ULTRVRSE 2.5X300X150 (BALLOONS) IMPLANT
CATH BEACON 5 .038 100 VERT TP (CATHETERS) IMPLANT
CATH PIG 70CM (CATHETERS) IMPLANT
CATH SEEKER .035X135CM (CATHETERS) IMPLANT
COVER PROBE ULTRASOUND 5X96 (MISCELLANEOUS) IMPLANT
DEVICE PRESTO INFLATION (MISCELLANEOUS) IMPLANT
DEVICE STARCLOSE SE CLOSURE (Vascular Products) IMPLANT
GLIDEWIRE ADV .035X260CM (WIRE) IMPLANT
GOWN STRL REUS W/ TWL LRG LVL3 (GOWN DISPOSABLE) ×1 IMPLANT
NDL ENTRY 21GA 7CM ECHOTIP (NEEDLE) IMPLANT
NEEDLE ENTRY 21GA 7CM ECHOTIP (NEEDLE) ×1 IMPLANT
PACK ANGIOGRAPHY (CUSTOM PROCEDURE TRAY) ×1 IMPLANT
SET INTRO CAPELLA COAXIAL (SET/KITS/TRAYS/PACK) IMPLANT
SHEATH ANL2 6FRX45 HC (SHEATH) IMPLANT
SHEATH BRITE TIP 5FRX11 (SHEATH) IMPLANT
STENT LIFESTENT 5F 6X60X135 (Permanent Stent) IMPLANT
SYR MEDRAD MARK 7 150ML (SYRINGE) IMPLANT
TUBING CONTRAST HIGH PRESS 72 (TUBING) IMPLANT
WIRE G V18X300CM (WIRE) IMPLANT
WIRE J 3MM .035X145CM (WIRE) IMPLANT

## 2024-08-11 NOTE — Op Note (Signed)
 [facility]  Percutaneous Study/Intervention Procedural Note   Date of Surgery: 08/11/2024  Surgeon:  [name]  Pre-operative Diagnosis: Atherosclerotic occlusive disease bilateral lower extremities with left lower extremity with rest pain and ulceration.  Post-operative diagnosis:  Same  Procedure(s) Performed:             1.  Introduction catheter into left lower extremity 3rd order catheter placement               2.    Contrast injection left lower extremity for distal runoff             3.  Percutaneous transluminal angioplasty and stent placement mid left superficial femoral artery             4.  Percutaneous transluminal angioplasty left posterior tibial             5.  Star close closure right common femoral arteriotomy  Anesthesia: Conscious sedation was administered under my direct supervision by the interventional radiology RN. IV Versed  plus fentanyl  were utilized. Continuous ECG, pulse oximetry and blood pressure was monitored throughout the entire procedure.  Conscious sedation was for a total of 61.  Sheath: 6 [place] Ansell right common femoral retrograde  Contrast: 70 cc  Fluoroscopy Time: 8.7 minutes  Indications:  Erin Hinton presents with increasing pain of the left lower extremity.  Noninvasive studies as well as physical examination demonstrate severe atherosclerotic occlusive disease.  This suggests the patient is having limb threatening ischemia and is at increased risk for limb loss.  Angiography with the hope for intervention is recommended for limb salvage.  The risks and benefits are reviewed all questions answered patient agrees to proceed.  Procedure:   Erin Hinton is a 78 y.o. y.o. female who was identified and appropriate procedural time out was performed.  The patient was then placed supine on the table and prepped and draped in the usual sterile fashion.    Ultrasound was placed in the sterile sleeve and the  right groin was evaluated the right common femoral artery was echolucent and pulsatile indicating patency.  Image was recorded for the permanent record and under real-time visualization a microneedle was inserted into the common femoral artery followed by the microwire and then the micro-sheath.  A J-wire was then advanced through the micro-sheath and a  5 [place] sheath was then inserted over a J-wire. J-wire was then advanced and a 5 French pigtail catheter was positioned at the level of T12.  AP projection of the aorta was then obtained. Pigtail catheter was repositioned to above the bifurcation and a RAO view of the pelvis was obtained.  Subsequently a pigtail catheter with an Advantage wire was used to cross the aortic bifurcation.  The catheter and wire were advanced down into the left distal external iliac artery. Oblique view of the femoral bifurcation was then obtained and subsequently the wire was reintroduced and the pigtail catheter negotiated into the SFA representing third order catheter placement. Distal runoff was then performed.  6000 units of heparin  was then given and allowed to circulate for several minutes.  A 6 French Ansell sheath was advanced up and over the bifurcation and positioned in the femoral artery  KMP catheter and advantage Glidewire were then negotiated down into the distal popliteal.  A 6 mm x 60 mm life stent was then deployed across the 80% stenosis in the mid SFA and a 5 mm x 60 mm Lutonix drug-eluting  balloon was used to angioplasty the SFA.  The inflation was for 1 minute at 12 atm. Follow-up imaging demonstrated patency with less than 10% residual stenosis.  The detector was then positioned distally.  A Kumpe catheter and advantage wire were then negotiated into the posterior tibial artery which was occluded shortly after its origin all the way down to the ankle.  The wire and catheter were negotiated down below the level of the medial malleolus and hand-injection of  contrast verified intraluminal positioning.  A 2.5 mm x 300 mm Ultraverse balloon was advanced into the posterior tibial artery.  Inflation was to 12 atmospheres for 1 minute. Follow-up imaging demonstrated patency with less than 10% residual stenosis. Distal runoff was then reassessed and noted to be widely patent.    After review of these images the sheath is pulled into the right external iliac oblique of the common femoral is obtained and a Star close device deployed. There no immediate Complications.  Findings:  The abdominal aorta is opacified with a bolus injection contrast. Renal arteries are single and widely patent without evidence of hemodynamically significant stenosis.  The aorta itself has diffuse disease but no hemodynamically significant lesions. The common and external iliac arteries are widely patent bilaterally.  The right common femoral is patent as is the profunda femoris.  The SFA does indeed have a significant stenosis in its midportion of approximately 80%.  Distal to this SFA lesion the SFA is free of hemodynamically significant stenoses.  Popliteal artery is patent throughout its course.  Trifurcation is patent but the anterior tibial occludes shortly after its origin and remains occluded throughout its entire course.  There is no significant filling of the dorsalis pedis.  The tibioperoneal trunk and peroneal are widely patent and free of hemodynamically significant stenosis although the distal tibioperoneal trunk does have a 40% stenosis.  The posterior tibial occludes shortly after its origin.  It reconstitutes at the level of the ankle and fills the plantar arteries which fills the pedal arch.  Following angioplasty posterior tibial now is in-line flow and looks quite nice with less than 10% residual stenosis. Angioplasty and stent placement of the SFA in its midportion yields an excellent result with less than 10% residual stenosis.  Summary: Successful recanalization left  lower extremity for limb salvage                           Disposition: Patient was taken to the recovery room in stable condition having tolerated the procedure well.  Cordella Romolo Sieling 08/11/2024,4:17 PM

## 2024-08-11 NOTE — Progress Notes (Signed)
 Handoff received from Joni RN at [phone]. PT had had recurring hematoma when attempting to discharge at 1811 but pressure held by [facility] for 15 minutes and  was resolved.  During assessment at 1850 pt noted to have recurring swelling. Initiated pressure holding. During holding pressure, pt noted to have increased swelling - continued pressure. Switch to [place] RN holding pressure - this RN attempting to soften hematoma. During pressure holding pt had change in vitals and became nauseas - see flow sheets. Dr. Jama made aware of continued hematoma and pt change. Orders to cath pt and MD stated he would assess pt.  1930 hematoma soft again and pressure discontinued. PT's BP increased and nausea decreased. Dr. Jama assessed groin and applied pressure bandage. PT to 2C in stable condition.

## 2024-08-11 NOTE — Interval H&P Note (Signed)
 History and Physical Interval Note:  08/11/2024 4:26 PM  Erin Hinton  has presented today for surgery, with the diagnosis of LLE Angio   ASO w rest pain.  The various methods of treatment have been discussed with the patient and family. After consideration of risks, benefits and other options for treatment, the patient has consented to  Procedure(s): LOWER EXTREMITY INTERVENTION (Left) Lower Extremity Angiography (Left) as a surgical intervention.  The patient's history has been reviewed, patient examined, no change in status, stable for surgery.  I have reviewed the patient's chart and labs.  Questions were answered to the patient's satisfaction.     [name]

## 2024-08-11 NOTE — Interval H&P Note (Signed)
 History and Physical Interval Note:  08/11/2024 2:55 PM  Erin Hinton  has presented today for surgery, with the diagnosis of LLE Angio   ASO w rest pain.  The various methods of treatment have been discussed with the patient and family. After consideration of risks, benefits and other options for treatment, the patient has consented to  Procedure(s): LOWER EXTREMITY INTERVENTION (Left) Lower Extremity Angiography (Left) as a surgical intervention.  The patient's history has been reviewed, patient examined, no change in status, stable for surgery.  I have reviewed the patient's chart and labs.  Questions were answered to the patient's satisfaction.     [name]

## 2024-08-12 ENCOUNTER — Encounter: Payer: Self-pay | Admitting: Vascular Surgery

## 2024-08-12 DIAGNOSIS — I70222 Atherosclerosis of native arteries of extremities with rest pain, left leg: Secondary | ICD-10-CM | POA: Diagnosis not present

## 2024-08-12 LAB — CBC
HCT: 31.1 % — ABNORMAL LOW (ref 36.0–46.0)
Hemoglobin: 10.3 g/dL — ABNORMAL LOW (ref 12.0–15.0)
MCH: 33.9 pg (ref 26.0–34.0)
MCHC: 33.1 g/dL (ref 30.0–36.0)
MCV: 102.3 fL — ABNORMAL HIGH (ref 80.0–100.0)
Platelets: 185 K/uL (ref 150–400)
RBC: 3.04 MIL/uL — ABNORMAL LOW (ref 3.87–5.11)
RDW: 13.2 % (ref 11.5–15.5)
WBC: 6.8 K/uL (ref 4.0–10.5)
nRBC: 0 % (ref 0.0–0.2)

## 2024-08-12 NOTE — Progress Notes (Signed)
 Sent secure chat to Orvin Daring, NP and made her aware that foley was removed at 1030. she voided 100 cc around 1pm. Performed bladder scan for post void residual and bladder scan 0 mL. NP acknowledged and stated she'd place her discharge orders.

## 2024-08-12 NOTE — Care Management Obs Status (Signed)
 MEDICARE OBSERVATION STATUS NOTIFICATION   Patient Details  Name: Erin Hinton MRN: [id number] Date of Birth: Jan 19, 1946   Medicare Observation Status Notification Given:  Yes    Rojelio SHAUNNA Rattler 08/12/2024, 12:05 PM

## 2024-08-12 NOTE — Discharge Summary (Signed)
 [facility]    Discharge Summary    Patient ID:  Erin Hinton MRN: [id number] DOB/AGE: February 01, 1946 78 y.o.  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 ER positive: 51-90%   Depression    Heart murmur    in her 30's and it is not a problem now   Hyperlipidemia    Hyperlipidemia    Hypertension    Osteopenia    Personal history of radiation therapy    Shortness of breath dyspnea    due to excercise only    Procedure(s): LOWER EXTREMITY INTERVENTION Lower Extremity Angiography  Discharged Condition: good  HPI:  Erin Hinton is a 78 year old patient that presented to [facility] on 08/11/2024 to undergo a left lower extremity angiogram.  Left lower extremity angiogram was successful however there was concern for possible hematoma postintervention.  Due to this concern the patient was kept overnight for observation.  Overnight she has done well without any significant issue.  Groin is soft with bruising.  Hospital Course:  Erin Hinton is a 78 y.o. female is S/P Left lower extremity angiogram Extubated: POD # 1 Physical Exam:  Alert notes x3, no acute distress Face: Symmetrical.  Tongue is midline. Neck: Trachea is midline.  No swelling or bruising. Cardiovascular: Regular rate and rhythm Pulmonary: Clear to auscultation bilaterally Abdomen: Soft, nontender,  nondistended Right groin access: Clean dry and intact.  No swelling or drainage noted Left groin access: Clean dry and intact.  No swelling or drainage noted Left lower extremity: Thigh soft.  Calf soft.  Extremities warm distally toes.  Hard to palpate pedal pulses however the foot is warm is her good capillary refill. Right lower extremity: Thigh soft.  Calf soft.  Extremities warm distally toes.  Hard to palpate pedal pulses however the foot is warm is her good capillary refill. Neurological: No deficits noted   Post-op wounds:  healing well  Pt. Ambulating, voiding and taking PO diet without difficulty. Pt pain controlled with PO pain meds.  Labs:  As below  Complications: none  Consults:    Significant Diagnostic Studies: CBC Lab Results  Component Value Date   WBC 6.8 08/12/2024   HGB 10.3 (L) 08/12/2024   HCT 31.1 (L) 08/12/2024   MCV 102.3 (H) 08/12/2024   PLT 185 08/12/2024    BMET    Component Value Date/Time   NA 144 05/13/2024 1046   K 4.3 05/13/2024 1046   CL 103 05/13/2024 1046   CO2 24 05/13/2024 1046   GLUCOSE 99 05/13/2024 1046   GLUCOSE 98 02/26/2024 1121   BUN 16 08/11/2024 1401   BUN 15 05/13/2024 1046   CREATININE 0.89 08/11/2024 1401   CALCIUM  10.3 05/13/2024 1046   GFRNONAA >60 08/11/2024 1401   GFRAA 94 11/23/2008 0841   COAG No results found for: INR, PROTIME   Disposition:  Discharge to :Home Discharge Instructions  Call MD for:  redness, tenderness, or signs of infection (pain, swelling, bleeding, redness, odor or green/yellow discharge around incision site)   Complete by: As directed    Call MD for:  severe or increased pain, loss or decreased feeling  in affected limb(s)   Complete by: As directed    Call MD for:  temperature >100.5   Complete by: As directed    Discharge instructions   Complete by: As directed    Okay to shower after 36 hours.  Please remove the bandage from the right groin before showering and  replace with a Band-Aid daily as needed   Driving Restrictions   Complete by: As directed    No driving for 36 hours   Lifting restrictions   Complete by: As directed    No lifting for 1 week   No dressing needed   Complete by: As directed    Replace only if drainage present   Resume previous diet   Complete by: As directed       Allergies as of 08/12/2024       Reactions   Alendronate Sodium    GI pain   Atorvastatin    Muscle pain   Crestor  [rosuvastatin  Calcium ]    Muscle pain   Evista [raloxifene] Other (See Comments)   Breast tenderness   Rosuvastatin  Other (See Comments)   Sertraline Hcl    Did not help, made anxiety worse   Pseudoephedrine Palpitations        Medication List     TAKE these medications    acetaminophen  500 MG tablet Commonly known as: TYLENOL  Take 500 mg by mouth every 6 (six) hours as needed (for pain.).   albuterol  108 (90 Base) MCG/ACT inhaler Commonly known as: VENTOLIN  HFA Inhale 2 puffs into the lungs every 4 (four) hours as needed for wheezing or shortness of breath.   aspirin  EC 81 MG tablet Take 1 tablet (81 mg total) by mouth daily. Swallow whole.   CAL-MAG-ZINC PO Take 1 tablet by mouth daily.   CALCIUM  600 + D PO Take 1 capsule by mouth daily.   clopidogrel  75 MG tablet Commonly known as: Plavix  Take 1 tablet (75 mg total) by mouth daily.   escitalopram  20 MG tablet Commonly known as: LEXAPRO  TAKE 1 TABLET BY MOUTH EVERY DAY   Fish Oil 1000 MG Caps Take 1,000 mg by mouth daily.   fluticasone  50 MCG/ACT nasal spray Commonly known as: FLONASE  Place 2 sprays into both nostrils daily as needed for allergies.   hydrochlorothiazide  25 MG tablet Commonly known as: HYDRODIURIL  TAKE 1 TABLET BY MOUTH EVERY DAY   oxycodone  5 MG capsule Commonly known as: OXY-IR Take 1 capsule (5 mg total) by mouth every 6 (six) hours as needed.   Repatha  SureClick 140 MG/ML Soaj Generic drug: Evolocumab  Inject 140 mg into the  skin every 14 (fourteen) days.   vitamin C with rose hips 1000 MG tablet Take 1,000 mg by mouth daily.   Vitamin D  50 MCG (2000 UT) tablet Take 2,000 Units by mouth daily.   vitamin E 1000 UNIT capsule Take 1 capsule by mouth daily.   Wixela Inhub 250-50 MCG/ACT Aepb Generic drug: fluticasone -salmeterol INHALE 1 PUFF INTO THE LUNGS IN THE MORNING AND AT BEDTIME.       Verbal and written Discharge instructions given to the patient. Wound care per Discharge AVS  Follow-up Information     Schnier, Cordella MATSU, MD Follow up in 3 week(s).  Specialties: Vascular Surgery, Cardiology, Radiology, Vascular Surgery Why: follow up after procedure with an ABI Contact information: [address] [place] [address] [phone]                 Signed: Orvin FORBES Daring, NP  08/12/2024, 10:08 AM

## 2024-08-12 NOTE — Plan of Care (Signed)
  Problem: Cardiovascular: Goal: Ability to achieve and maintain adequate cardiovascular perfusion will improve Outcome: Progressing   Problem: Education: Goal: Knowledge of General Education information will improve Description: Including pain rating scale, medication(s)/side effects and non-pharmacologic comfort measures Outcome: Progressing   Problem: Clinical Measurements: Goal: Diagnostic test results will improve Outcome: Progressing   Problem: Nutrition: Goal: Adequate nutrition will be maintained Outcome: Progressing

## 2024-08-12 NOTE — TOC CM/SW Note (Signed)
 Transition of Care [facility]) - Inpatient Brief Assessment   Patient Details  Name: Erin Hinton MRN: [id number] Date of Birth: 1946-04-13  Transition of Care [facility]) CM/SW Contact:    Corean ONEIDA Haddock, RN Phone Number: 08/12/2024, 9:11 AM   Clinical Narrative:   Transition of Care [facility]) Screening Note   Patient Details  Name: Erin Hinton Date of Birth: 16-Jun-1946   Transition of Care [facility]) CM/SW Contact:    Corean ONEIDA Haddock, RN Phone Number: 08/12/2024, 9:11 AM    Transition of Care Department [facility]) has reviewed patient and no TOC needs have been identified at this time. . If new patient transition needs arise, please place a TOC consult.    Transition of Care Asessment: Insurance and Status: Insurance coverage has been reviewed Patient has primary care physician: Yes       Social Drivers of Health Review: SDOH reviewed no interventions necessary Readmission risk has been reviewed: Yes Transition of care needs: no transition of care needs at this time

## 2024-08-12 NOTE — Progress Notes (Signed)
 Discharge instructions given to and reviewed with patient. Patient verbalized understanding of all discharge instructions including activity restrictions, when to call the doctor and follow up care. Patient left unit by wheelchair with Sharrell, NT. Son driving patient home.

## 2024-08-13 ENCOUNTER — Ambulatory Visit: Admitting: Student

## 2024-08-13 ENCOUNTER — Ambulatory Visit: Admitting: Medical

## 2024-08-26 ENCOUNTER — Other Ambulatory Visit (INDEPENDENT_AMBULATORY_CARE_PROVIDER_SITE_OTHER): Payer: Self-pay | Admitting: Vascular Surgery

## 2024-08-26 DIAGNOSIS — I739 Peripheral vascular disease, unspecified: Secondary | ICD-10-CM

## 2024-08-27 NOTE — Progress Notes (Unsigned)
 Cardiology Clinic Note   Date: 08/28/2024 ID: Jawanda, Passey 08/19/1946, MRN [id number]  Primary Cardiologist:  Evalene Lunger, MD  Chief Complaint   SHARNISE Hinton is a 78 y.o. female who presents to the clinic today for routine follow up.   Patient Profile   Erin Hinton is followed by Dr. Gollan for the history outlined below.      Past medical history significant for: Coronary artery calcification/aortic atherosclerosis. Mitral valve prolapse. Echo 04/24/2024: EF 60 to 65% no RWMA.  Grade 1 DD.  Normal RV size/function.  Mitral valve is normal in structure with no evidence of MR. PAD.  Lower extremity arterial ultrasound 05/13/2024: 50 to 74% right common femoral artery, superficial femoral artery.  50 to 74% left superficial femoral artery. Lower extremity intervention 07/14/2024: PTA and stent placement right superficial femoral artery, right external iliac artery.  PTA right posterior tibial artery. Lower extremity intervention 08/11/2024: PTA and stent placement mid left superficial femoral artery.  PTA left posterior tibial. Hypertension. Hyperlipidemia. Lipid panel 02/26/2024: LDL 166, HDL 74, TG 106, total 261. COPD. Breast cancer. Former tobacco abuse.  In summary, patient was evaluated by Dr. Monetta on 02/11/2019 for an abnormal EKG prior to breast surgery.  EKG demonstrated nonspecific ST changes.  She reported a history of mitral valve prolapse diagnosed 37 years prior.  Echo demonstrated EF 55%.  Normal diastolic parameters.  Normal RV size/function.  Mild thickening of the mitral valve leaflet.  Mild calcification of the aortic valve.  Patient establish care with Dr. Gollan on 06/14/2021.  She complained of shortness of breath.  She reported being mostly sedentary after breast surgery and through COVID with some weight gain.  It was felt symptoms were due to deconditioning.  Further testing was deferred at that time.  Patient was seen by Cadence Furth,  PA-C on 03/27/2024 with complaints of continued shortness of breath somewhat improved with Advair.  She also complained of leg cramps.  Given coronary artery calcifications and aortic atherosclerosis seen on CT patient was started on aspirin  and Repatha  as she had previously been intolerant to statins.  ABIs were abnormal and patient underwent lower extremity arterial ultrasound showing disease in bilateral lower extremities.  She was referred to vascular surgery.  Echo showed normal LV/RV function.  Patient underwent lower extremity intervention in July 2025 and August 2025 with vascular surgery.     History of Present Illness    Today, patient is doing well. She reports her leg pain has resolved since vascular interventions. She has started walking more and using her seated peddler with good tolerance. She does have mild muscle soreness of bilateral thighs. She is doing well from a cardiac standpoint. Patient denies shortness of breath, dyspnea on exertion, lower extremity edema, orthopnea or PND. No chest pain, pressure, or tightness. No palpitations.      ROS: All other systems reviewed and are otherwise negative except as noted in History of Present Illness.  EKGs/Labs Reviewed    EKG Interpretation Date/Time:  Friday August 28 2024 13:32:47 EDT Ventricular Rate:  77 PR Interval:  136 QRS Duration:  76 QT Interval:  374 QTC Calculation: 423 R Axis:   66  Text Interpretation: Normal sinus rhythm Nonspecific ST abnormality When compared with ECG of 27-Mar-2024 09:32, No significant change was found Confirmed by Loistine Sober [phone]) on 08/28/2024 1:34:59 PM   02/26/2024: ALT 15; AST 20 05/13/2024: Potassium 4.3; Sodium 144 08/11/2024: BUN 16; Creatinine, Ser 0.89   08/12/2024:  Hemoglobin 10.3; WBC 6.8   02/26/2024: TSH 1.56   Physical Exam    VS:  BP 128/60   Pulse 77   Ht 5' 5.5 (1.664 m)   Wt 186 lb 9.6 oz (84.6 kg)   SpO2 98%   BMI 30.58 kg/m  , BMI Body mass index is  30.58 kg/m.  GEN: Well nourished, well developed, in no acute distress. Neck: No JVD or carotid bruits. Cardiac:  RRR.  No murmur. No rubs or gallops.   Respiratory:  Respirations regular and unlabored. Clear to auscultation without rales, wheezing or rhonchi. GI: Soft, nontender, nondistended. Extremities: Radials/DP/PT 2+ and equal bilaterally. No clubbing or cyanosis. No edema  Skin: Warm and dry, no rash. Neuro: Strength intact.  Assessment & Plan   Coronary artery calcification/aortic atherosclerosis Seen on CT. Patient denies chest pain, pressure or tightness. She is walking more and using seated peddler since vascular intervention.  - Increase physical activity as tolerated.  - Continue aspirin , Repatha .  Plavix  managed by vascular surgery.  PAD S/p PTA and stent placement as detailed in patient profile in July and August 2025.  Patient reports leg pain has resolved since interventions. She is tolerating walking more.  - Continue aspirin , Plavix , Repatha . - Continue to follow with vascular surgery.  Hypertension BP today 126/60. No report of headaches or dizziness.  - Continue hydrochlorothiazide .  Hyperlipidemia/statin myopathy LDL 166 March 2025, not at goal.  Patient intolerant to statins in the past. Patient was started on Repatha  in April 2025. - Continue Repatha . - Lipid panel and LFTs next week when she is fasting.   Disposition: Lipid panel and LFTs next week. Return in 1 year or sooner as needed.          Signed, Barnie HERO. Patrica Mendell, DNP, NP-C

## 2024-08-28 ENCOUNTER — Encounter: Payer: Self-pay | Admitting: Student

## 2024-08-28 ENCOUNTER — Ambulatory Visit: Attending: Student | Admitting: Student

## 2024-08-28 VITALS — BP 128/60 | HR 77 | Ht 65.5 in | Wt 186.6 lb

## 2024-08-28 DIAGNOSIS — Z79899 Other long term (current) drug therapy: Secondary | ICD-10-CM | POA: Diagnosis not present

## 2024-08-28 DIAGNOSIS — I10 Essential (primary) hypertension: Secondary | ICD-10-CM

## 2024-08-28 DIAGNOSIS — G720 Drug-induced myopathy: Secondary | ICD-10-CM | POA: Diagnosis not present

## 2024-08-28 DIAGNOSIS — I2510 Atherosclerotic heart disease of native coronary artery without angina pectoris: Secondary | ICD-10-CM | POA: Diagnosis not present

## 2024-08-28 DIAGNOSIS — I739 Peripheral vascular disease, unspecified: Secondary | ICD-10-CM

## 2024-08-28 DIAGNOSIS — T466X5D Adverse effect of antihyperlipidemic and antiarteriosclerotic drugs, subsequent encounter: Secondary | ICD-10-CM | POA: Diagnosis not present

## 2024-08-28 DIAGNOSIS — E785 Hyperlipidemia, unspecified: Secondary | ICD-10-CM | POA: Diagnosis not present

## 2024-08-28 DIAGNOSIS — T466X5A Adverse effect of antihyperlipidemic and antiarteriosclerotic drugs, initial encounter: Secondary | ICD-10-CM

## 2024-08-28 MED ORDER — REPATHA SURECLICK 140 MG/ML ~~LOC~~ SOAJ: 140.0000 mg | SUBCUTANEOUS | 3 refills | Status: DC

## 2024-08-28 NOTE — Patient Instructions (Signed)
 Medication Instructions:  Your physician recommends that you continue on your current medications as directed. Please refer to the Current Medication list given to you today.    We have sent the refill prescription for your Repatha  to your pharmacy.  *If you need a refill on your cardiac medications before your next appointment, please call your pharmacy*  Lab Work: Your provider would like for you to return in Next Week to have the following labs drawn: Hepatic Function Panel, and Lipid Panel.   Please go to [facility] [address] ([facility] [address], [place] [address] You do not need an appointment.  They are open from 8 am- 4:30 pm.  Lunch from 1:00 pm- 2:00 pm You DO need to be fasting.   You may also go to one of the following [facility]:  [address] [place], [place] [address] Phone: [phone] Lab hours: Mon-Fri 8 am- 5 pm    Lunch 12 pm- 1 pm  [address] [place],  [place]  [address]  [place]  Phone: [phone] Lab hours: 7 am- 4 pm Lunch 12 pm-1 pm   [address] [place],  [place]  [address]  [place]  Phone: [phone] Lab hours: Mon-Fri 8 am- 5 pm    Lunch 12 pm- 1 pm  If you have labs (blood work) drawn today and your tests are completely normal, you will receive your results only by: MyChart Message (if you have MyChart) OR A paper copy in the mail If you have any lab test that is abnormal or we need to change your treatment, we will call you to review the results.  Testing/Procedures: None ordered at this time   Follow-Up: At [facility], you and your health needs are our priority.  As part of our continuing mission to provide you with exceptional heart care, our providers are all part of one team.  This team includes your primary Cardiologist (physician) and Advanced Practice Providers or APPs (Physician Assistants and Nurse Practitioners) who all work together to provide you with the care you need, when you need it.  Your  next appointment:   1 year(s)  Provider:   You may see one of the following Advanced Practice Providers on your designated Care Team:   Lonni Meager, NP Lesley Maffucci, PA-C Bernardino Bring, PA-C Cadence [place], PA-C Tylene Lunch, NP Barnie Hila, NP    We recommend signing up for the patient portal called MyChart.  Sign up information is provided on this After Visit Summary.  MyChart is used to connect with patients for Virtual Visits (Telemedicine).  Patients are able to view lab/test results, encounter notes, upcoming appointments, etc.  Non-urgent messages can be sent to your provider as well.   To learn more about what you can do with MyChart, go to [email or web address].

## 2024-08-31 ENCOUNTER — Encounter (INDEPENDENT_AMBULATORY_CARE_PROVIDER_SITE_OTHER): Payer: Self-pay | Admitting: Nurse Practitioner

## 2024-08-31 ENCOUNTER — Ambulatory Visit (INDEPENDENT_AMBULATORY_CARE_PROVIDER_SITE_OTHER)

## 2024-08-31 ENCOUNTER — Ambulatory Visit (INDEPENDENT_AMBULATORY_CARE_PROVIDER_SITE_OTHER): Admitting: Nurse Practitioner

## 2024-08-31 VITALS — Resp 17 | Ht 65.0 in | Wt 183.8 lb

## 2024-08-31 DIAGNOSIS — Z9889 Other specified postprocedural states: Secondary | ICD-10-CM

## 2024-08-31 DIAGNOSIS — E7800 Pure hypercholesterolemia, unspecified: Secondary | ICD-10-CM

## 2024-08-31 DIAGNOSIS — I70213 Atherosclerosis of native arteries of extremities with intermittent claudication, bilateral legs: Secondary | ICD-10-CM

## 2024-08-31 DIAGNOSIS — I10 Essential (primary) hypertension: Secondary | ICD-10-CM | POA: Diagnosis not present

## 2024-08-31 DIAGNOSIS — Z79899 Other long term (current) drug therapy: Secondary | ICD-10-CM | POA: Diagnosis not present

## 2024-08-31 DIAGNOSIS — I739 Peripheral vascular disease, unspecified: Secondary | ICD-10-CM

## 2024-08-31 NOTE — Progress Notes (Signed)
 Subjective:    Patient ID: Erin Hinton, female    DOB: 05/26/1946, 78 y.o.   MRN: [id number] Chief Complaint  Patient presents with   Follow-up    Questions on when she can return to having nails done, tenderness in legs recently started working out    The patient returns to the office for followup and review status post angiogram with intervention:  Procedure: 08/11/2024 Procedure(s) Performed:             1.  Introduction catheter into left lower extremity 3rd order catheter placement               2.    Contrast injection left lower extremity for distal runoff             3.  Percutaneous transluminal angioplasty and stent placement mid left superficial femoral artery             4.  Percutaneous transluminal angioplasty left posterior tibial             5.  Star close closure right common femoral arteriotomy  07/14/2024 Procedure(s) Performed:             1.  Introduction catheter into right lower extremity 3rd order catheter placement               2.    Contrast injection right lower extremity for distal runoff             3.  Percutaneous transluminal angioplasty and stent placement right superficial femoral artery to 5 mm             4.  Percutaneous transluminal angioplasty right posterior tibial artery to 2.5 mm             5.  Percutaneous transluminal angioplasty and stent placement right external iliac artery to 7 mm.               6.  Star close closure left common femoral arteriotomy   The patient notes improvement in the lower extremity symptoms. No interval shortening of the patient's claudication distance or rest pain symptoms. No new ulcers or wounds have occurred since the last visit.  There have been no significant changes to the patient's overall health care.  No documented history of amaurosis fugax or recent TIA symptoms. There are no recent neurological changes noted. No documented history of DVT, PE or superficial thrombophlebitis. The patient  denies recent episodes of angina or shortness of breath.   ABI's Rt=1.05 and Lt=1.04  (previous ABI's Rt=0.99 and Lt=0.74) Duplex US  of the bilateral tibial vessels reveals multiphasic waveforms with good toe waveforms    Review of Systems     Objective:   Physical Exam Vitals reviewed.  HENT:     Head: Normocephalic.  Cardiovascular:     Rate and Rhythm: Normal rate.     Pulses:          Dorsalis pedis pulses are detected w/ Doppler on the right side and detected w/ Doppler on the left side.       Posterior tibial pulses are detected w/ Doppler on the right side and detected w/ Doppler on the left side.  Pulmonary:     Effort: Pulmonary effort is normal.  Skin:    General: Skin is warm and dry.  Neurological:     Mental Status: She is alert and oriented to person, place, and time.  Psychiatric:  Mood and Affect: Mood normal.        Behavior: Behavior normal.        Thought Content: Thought content normal.        Judgment: Judgment normal.     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 ER positive: 51-90%   Depression    Heart murmur    in her 30's and it is not a problem now   Hyperlipidemia    Hyperlipidemia    Hypertension    Osteopenia    Personal history of radiation therapy    Shortness of breath dyspnea    due to excercise only    Social History   Socioeconomic History   Marital status: Widowed    Spouse name: Not on file   Number of children: Not on file   Years of education: Not on file   Highest education level: Some college, no degree  Occupational History   Not on file  Tobacco Use   Smoking status: Former    Current packs/day: 0.00     Average packs/day: 0.5 packs/day for 45.0 years (22.5 ttl pk-yrs)    Types: Cigarettes, E-cigarettes    Start date: 01/21/1971    Quit date: 01/22/2016    Years since quitting: 8.6    Passive exposure: Past   Smokeless tobacco: Never   Tobacco comments:    also uses e-cigaretts on occasion  Vaping Use   Vaping status: Some Days   Substances: Nicotine, Flavoring  Substance and Sexual Activity   Alcohol use: No   Drug use: No   Sexual activity: Not Currently    Birth control/protection: None  Other Topics Concern   Not on file  Social History Narrative   Not on file   Social Drivers of Health   Financial Resource Strain: Low Risk  (02/22/2024)   Overall Financial Resource Strain (CARDIA)    Difficulty of Paying Living Expenses: Not hard at all  Food Insecurity: No Food Insecurity (08/12/2024)   Hunger Vital Sign    Worried About Running Out of Food in the Last Year: Never true    Ran Out of Food in the Last Year: Never true  Transportation Needs: No Transportation Needs (08/12/2024)   PRAPARE - Administrator, Civil Service (Medical): No    Lack of Transportation (Non-Medical): No  Physical Activity: Insufficiently Active (02/22/2024)   Exercise Vital Sign    Days of Exercise per Week: 2 days    Minutes of Exercise per Session: 20 min  Stress: Stress Concern Present (02/22/2024)   [employer] of Occupational Health - Occupational Stress Questionnaire    Feeling of Stress : To some extent  Social Connections: Moderately Isolated (08/12/2024)   Social Connection and Isolation Panel    Frequency of Communication with Friends and Family: More than three times a week    Frequency of Social Gatherings with Friends and Family: Twice a week    Attends Religious Services: More than 4 times per year    Active Member of [employer] or Organizations:  No    Attends Banker Meetings: Never    Marital Status: Widowed  Intimate Partner Violence: Not At Risk (08/12/2024)    Humiliation, Afraid, Rape, and Kick questionnaire    Fear of Current or Ex-Partner: No    Emotionally Abused: No    Physically Abused: No    Sexually Abused: No    Past Surgical History:  Procedure Laterality Date   BREAST BIOPSY Left 01/27/2019   Affirm Bx- Coil Clip- HIGH-GRADE DUCTAL CARCINOMA IN SITU, WITH COMEDONECROSIS.   BREAST EXCISIONAL BIOPSY Right 01/2002   neg. no lymph node involvement   BREAST EXCISIONAL BIOPSY Left 03/02/2019   lumpectomy DCIS   BREAST LUMPECTOMY Left 03/02/2019   left lumpectomy dcis with NL   BREAST LUMPECTOMY Left 03/02/2019   Procedure: BREAST WIDE EXCISION LEFT, WITH WIRE LOCATION;  Surgeon: Dessa Reyes ORN, MD;  Location: [facility];  Service: General;  Laterality: Left;   BREAST SURGERY  1998   breast biopsy DCIS   CATARACT EXTRACTION W/PHACO Right 05/01/2016   Procedure: CATARACT EXTRACTION PHACO AND INTRAOCULAR LENS PLACEMENT (IOC);  Surgeon: Elsie Carmine, MD;  Location: [facility];  Service: Ophthalmology;  Laterality: Right;  US  00:53 AP% 21.7 CDE 11.50 fluid pack lot # [id number]   CATARACT EXTRACTION W/PHACO Left 06/05/2016   Procedure: CATARACT EXTRACTION PHACO AND INTRAOCULAR LENS PLACEMENT (IOC);  Surgeon: Elsie Carmine, MD;  Location: [facility];  Service: Ophthalmology;  Laterality: Left;  US  01:04 AP% 22.2 CDE 14.34 fluid pack lot # [id number]   COLONOSCOPY WITH PROPOFOL  N/A 11/21/2015   Procedure: COLONOSCOPY WITH PROPOFOL ;  Surgeon: Deward CINDERELLA Piedmont, MD;  Location: [facility];  Service: Gastroenterology;  Laterality: N/A;   EYE SURGERY     Cataract   LOWER EXTREMITY ANGIOGRAPHY Right 07/14/2024   Procedure: Lower Extremity Angiography;  Surgeon: Jama Cordella MATSU, MD;  Location: [facility];  Service: Cardiovascular;  Laterality: Right;   LOWER EXTREMITY ANGIOGRAPHY Left 08/11/2024   Procedure: Lower Extremity Angiography;  Surgeon: Jama Cordella MATSU, MD;  Location: [facility];  Service: Cardiovascular;   Laterality: Left;   LOWER EXTREMITY INTERVENTION Right 07/14/2024   Procedure: LOWER EXTREMITY INTERVENTION;  Surgeon: Jama Cordella MATSU, MD;  Location: [facility];  Service: Cardiovascular;  Laterality: Right;   LOWER EXTREMITY INTERVENTION Left 08/11/2024   Procedure: LOWER EXTREMITY INTERVENTION;  Surgeon: Jama Cordella MATSU, MD;  Location: [facility];  Service: Cardiovascular;  Laterality: Left;   SKIN BIOPSY  01/01/2023   Upper Back    Family History  Problem Relation Age of Onset   Ovarian cancer Mother    Heart disease Sister    Lung cancer Sister    Lymphoma Father    Heart disease Father        MI   Hypertension Father    Cancer Father    Alcohol abuse Brother    Lung cancer Brother    Breast cancer Sister 2   Heart disease Sister    Heart attack Sister    Breast cancer Daughter 29       Charleen Ee, LCIS   Breast cancer Paternal Aunt 28   Colon cancer Neg Hx     Allergies  Allergen Reactions   Alendronate Sodium     GI pain   Atorvastatin     Muscle pain   Crestor  [Rosuvastatin  Calcium ]     Muscle pain   Evista [Raloxifene] Other (See Comments)    Breast tenderness  Rosuvastatin  Other (See Comments)   Sertraline Hcl     Did not help, made anxiety worse   Pseudoephedrine Palpitations       Latest Ref Rng & Units 08/12/2024    4:36 AM 08/11/2024    9:32 PM 02/26/2024   11:21 AM  CBC  WBC 4.0 - 10.5 K/uL 6.8  13.0  6.1   Hemoglobin 12.0 - 15.0 g/dL 89.6  88.6  85.9   Hematocrit 36.0 - 46.0 % 31.1  34.0  41.9   Platelets 150 - 400 K/uL 185  198  232.0       CMP     Component Value Date/Time   NA 144 05/13/2024 1046   K 4.3 05/13/2024 1046   CL 103 05/13/2024 1046   CO2 24 05/13/2024 1046   GLUCOSE 99 05/13/2024 1046   GLUCOSE 98 02/26/2024 1121   BUN 16 08/11/2024 1401   BUN 15 05/13/2024 1046   CREATININE 0.89 08/11/2024 1401   CALCIUM  10.3 05/13/2024 1046   PROT 7.0 02/26/2024 1121   ALBUMIN 4.6 02/26/2024 1121   AST  20 02/26/2024 1121   ALT 15 02/26/2024 1121   ALKPHOS 62 02/26/2024 1121   BILITOT 0.5 02/26/2024 1121   GFR 76.57 02/26/2024 1121   EGFR 77 05/13/2024 1046   GFRNONAA >60 08/11/2024 1401     VAS US  ABI WITH/WO TBI Result Date: 07/30/2024  LOWER EXTREMITY DOPPLER STUDY Patient Name:  Erin Hinton  Date of Exam:   07/28/2024 Medical Rec #: [id number]           Accession #:    [id number] Date of Birth: October 31, 1946           Patient Gender: F Patient Age:   21 years Exam Location:  [facility] Procedure:      VAS US  ABI WITH/WO TBI Referring Phys: --------------------------------------------------------------------------------  Indications: Claudication, and peripheral artery disease. High Risk Factors: Hypertension.  Vascular Interventions: 07/14/2024 right SFA and EIA stents, PTA of PTA. Comparison Study: Known left SFA stenosis by previous study. Performing Technologist: Donnice Charnley RVT  Examination Guidelines: A complete evaluation includes at minimum, Doppler waveform signals and systolic blood pressure reading at the level of bilateral brachial, anterior tibial, and posterior tibial arteries, when vessel segments are accessible. Bilateral testing is considered an integral part of a complete examination. Photoelectric Plethysmograph (PPG) waveforms and toe systolic pressure readings are included as required and additional duplex testing as needed. Limited examinations for reoccurring indications may be performed as noted.  ABI Findings: +---------+------------------+-----+---------+--------+ Right    Rt Pressure (mmHg)IndexWaveform Comment  +---------+------------------+-----+---------+--------+ Brachial 155                                      +---------+------------------+-----+---------+--------+ PTA      153               0.99 triphasic         +---------+------------------+-----+---------+--------+ DP       150               0.97 triphasic          +---------+------------------+-----+---------+--------+ Great Toe121               0.78                   +---------+------------------+-----+---------+--------+ +---------+------------------+-----+----------+-------+ Left     Lt Pressure (mmHg)IndexWaveform  Comment +---------+------------------+-----+----------+-------+  Brachial 152                                      +---------+------------------+-----+----------+-------+ PTA      115               0.74 biphasic          +---------+------------------+-----+----------+-------+ DP       102               0.66 monophasic        +---------+------------------+-----+----------+-------+ Great Toe99                0.64                   +---------+------------------+-----+----------+-------+ +-------+-----------+-----------+------------+------------+ ABI/TBIToday's ABIToday's TBIPrevious ABIPrevious TBI +-------+-----------+-----------+------------+------------+ Right  0.99       0.78       0.77        0.69         +-------+-----------+-----------+------------+------------+ Left   0.74       0.64       1.01        1.08         +-------+-----------+-----------+------------+------------+  Right ABIs appear increased compared to prior study on 04/24/2024. Left ABIs appear decreased compared to prior study on 04/24/2024. Patient has a known >50% left SFA stenosis.  Summary: Right: Resting right ankle-brachial index is within normal range. The right toe-brachial index is normal. Left: Resting left ankle-brachial index indicates moderate left lower extremity arterial disease. The left toe-brachial index is abnormal. *See table(s) above for measurements and observations.  Electronically signed by Selinda Gu MD on 07/30/2024 at 7:26:04 AM.    Final        Assessment & Plan:   1. Atherosclerosis of native artery of both lower extremities with intermittent claudication (HCC) (Primary) Recommend:  The patient is status  post successful angiogram with intervention.  The patient reports that the claudication symptoms and leg pain has improved.   The patient denies lifestyle limiting changes at this point in time.  No further invasive studies, angiography or surgery at this time. The patient should continue walking and begin a more formal exercise program.  The patient should continue antiplatelet therapy and aggressive treatment of the lipid abnormalities  Continued surveillance is indicated as atherosclerosis is likely to progress with time.    Patient should undergo noninvasive studies as ordered. The patient will follow up with me to review the studies.   2. HYPERTENSION, BENIGN ESSENTIAL Continue antihypertensive medications as already ordered, these medications have been reviewed and there are no changes at this time.  3. Pure hypercholesterolemia Will continue with fish oil.  Patient not prescribed statin at discharge due to intolerance.  Current Outpatient Medications on File Prior to Visit  Medication Sig Dispense Refill   acetaminophen  (TYLENOL ) 500 MG tablet Take 500 mg by mouth every 6 (six) hours as needed (for pain.).     albuterol  (VENTOLIN  HFA) 108 (90 Base) MCG/ACT inhaler Inhale 2 puffs into the lungs every 4 (four) hours as needed for wheezing or shortness of breath. 18 g 3   Ascorbic Acid (VITAMIN C WITH ROSE HIPS) 1000 MG tablet Take 1,000 mg by mouth daily.     aspirin  EC 81 MG tablet Take 1 tablet (81 mg total) by mouth daily. Swallow whole.     Calcium  Carb-Cholecalciferol (CALCIUM  600 + D PO)  Take 1 capsule by mouth daily.     Calcium -Magnesium-Zinc (CAL-MAG-ZINC PO) Take 1 tablet by mouth daily.     Cholecalciferol (VITAMIN D ) 50 MCG (2000 UT) tablet Take 2,000 Units by mouth daily.     clopidogrel  (PLAVIX ) 75 MG tablet Take 1 tablet (75 mg total) by mouth daily. 30 tablet 5   escitalopram  (LEXAPRO ) 20 MG tablet TAKE 1 TABLET BY MOUTH EVERY DAY 90 tablet 2   Evolocumab  (REPATHA   SURECLICK) 140 MG/ML SOAJ Inject 140 mg into the skin every 14 (fourteen) days. 6 mL 3   fluticasone  (FLONASE ) 50 MCG/ACT nasal spray Place 2 sprays into both nostrils daily as needed for allergies. 48 mL 1   fluticasone -salmeterol (WIXELA INHUB) 250-50 MCG/ACT AEPB INHALE 1 PUFF INTO THE LUNGS IN THE MORNING AND AT BEDTIME. 60 each 2   hydrochlorothiazide  (HYDRODIURIL ) 25 MG tablet TAKE 1 TABLET BY MOUTH EVERY DAY 90 tablet 2   Omega-3 Fatty Acids (FISH OIL) 1000 MG CAPS Take 1,000 mg by mouth daily.     vitamin E 1000 UNIT capsule Take 1 capsule by mouth daily.     oxycodone  (OXY-IR) 5 MG capsule Take 1 capsule (5 mg total) by mouth every 6 (six) hours as needed. 20 capsule 0   No current facility-administered medications on file prior to visit.    There are no Patient Instructions on file for this visit. No follow-ups on file.   Kataryna Mcquilkin E Tyshea Imel, NP

## 2024-09-01 ENCOUNTER — Ambulatory Visit: Payer: Self-pay | Admitting: Student

## 2024-09-01 LAB — HEPATIC FUNCTION PANEL
ALT: 30 IU/L (ref 0–32)
AST: 27 IU/L (ref 0–40)
Albumin: 4.2 g/dL (ref 3.8–4.8)
Alkaline Phosphatase: 63 IU/L (ref 49–135)
Bilirubin Total: 0.5 mg/dL (ref 0.0–1.2)
Bilirubin, Direct: 0.17 mg/dL (ref 0.00–0.40)
Total Protein: 6.2 g/dL (ref 6.0–8.5)

## 2024-09-01 LAB — LIPID PANEL
Chol/HDL Ratio: 2.9 ratio (ref 0.0–4.4)
Cholesterol, Total: 153 mg/dL (ref 100–199)
HDL: 53 mg/dL (ref >39)
LDL Chol Calc (NIH): 78 mg/dL (ref 0–99)
Triglycerides: 126 mg/dL (ref 0–149)
VLDL Cholesterol Cal: 22 mg/dL (ref 5–40)

## 2024-09-02 LAB — VAS US ABI WITH/WO TBI
Left ABI: 1.04
Right ABI: 1.05

## 2024-09-02 NOTE — Progress Notes (Signed)
 Last read by [name] at 9:25AM on 09/02/2024.

## 2024-09-15 DIAGNOSIS — D2271 Melanocytic nevi of right lower limb, including hip: Secondary | ICD-10-CM | POA: Diagnosis not present

## 2024-09-15 DIAGNOSIS — D2262 Melanocytic nevi of left upper limb, including shoulder: Secondary | ICD-10-CM | POA: Diagnosis not present

## 2024-09-15 DIAGNOSIS — D2272 Melanocytic nevi of left lower limb, including hip: Secondary | ICD-10-CM | POA: Diagnosis not present

## 2024-09-15 DIAGNOSIS — D485 Neoplasm of uncertain behavior of skin: Secondary | ICD-10-CM | POA: Diagnosis not present

## 2024-09-15 DIAGNOSIS — Z85828 Personal history of other malignant neoplasm of skin: Secondary | ICD-10-CM | POA: Diagnosis not present

## 2024-09-15 DIAGNOSIS — D2261 Melanocytic nevi of right upper limb, including shoulder: Secondary | ICD-10-CM | POA: Diagnosis not present

## 2024-09-15 DIAGNOSIS — Z85820 Personal history of malignant melanoma of skin: Secondary | ICD-10-CM | POA: Diagnosis not present

## 2024-09-15 DIAGNOSIS — C44629 Squamous cell carcinoma of skin of left upper limb, including shoulder: Secondary | ICD-10-CM | POA: Diagnosis not present

## 2024-09-15 DIAGNOSIS — L570 Actinic keratosis: Secondary | ICD-10-CM | POA: Diagnosis not present

## 2024-10-04 ENCOUNTER — Other Ambulatory Visit: Payer: Self-pay | Admitting: Family Medicine

## 2024-10-30 ENCOUNTER — Other Ambulatory Visit: Payer: Self-pay | Admitting: Family Medicine

## 2024-11-03 ENCOUNTER — Other Ambulatory Visit: Payer: Self-pay | Admitting: Family Medicine

## 2024-11-09 MED ORDER — REPATHA SURECLICK 140 MG/ML ~~LOC~~ SOAJ: 140.0000 mg | SUBCUTANEOUS | 0 refills | Status: DC

## 2024-11-19 ENCOUNTER — Other Ambulatory Visit (INDEPENDENT_AMBULATORY_CARE_PROVIDER_SITE_OTHER): Payer: Self-pay | Admitting: Vascular Surgery

## 2024-11-19 DIAGNOSIS — C44629 Squamous cell carcinoma of skin of left upper limb, including shoulder: Secondary | ICD-10-CM | POA: Diagnosis not present

## 2024-11-19 DIAGNOSIS — I739 Peripheral vascular disease, unspecified: Secondary | ICD-10-CM

## 2024-11-24 MED ORDER — REPATHA SURECLICK 140 MG/ML ~~LOC~~ SOAJ: 140.0000 mg | SUBCUTANEOUS | 3 refills | Status: DC

## 2024-11-24 NOTE — Telephone Encounter (Signed)
 Called [employer] to request a replacement pen for the one patient states is damaged (bent needle) [employer] rep stated that [facility] will be contacting patient to schedule replacement deliver  [phone] Case # [id number]   Contacted patient to advise of replacement coming and she is requesting a refill at [facility] - refill ordered and sent to [facility]

## 2024-11-24 NOTE — Addendum Note (Signed)
 Addended by: Abrina Petz D on: 11/24/2024 09:18 AM   Modules accepted: Orders

## 2024-11-27 NOTE — Telephone Encounter (Signed)
 Spoke with pharmacy her next scheduled pick up will be the end of December, replacement pen is being shipped directly to patient from [employer] - patient at pharmacy and notified

## 2024-11-30 ENCOUNTER — Encounter (INDEPENDENT_AMBULATORY_CARE_PROVIDER_SITE_OTHER): Payer: Self-pay | Admitting: Nurse Practitioner

## 2024-11-30 ENCOUNTER — Ambulatory Visit (INDEPENDENT_AMBULATORY_CARE_PROVIDER_SITE_OTHER)

## 2024-11-30 ENCOUNTER — Other Ambulatory Visit: Payer: Self-pay | Admitting: Student

## 2024-11-30 ENCOUNTER — Other Ambulatory Visit (INDEPENDENT_AMBULATORY_CARE_PROVIDER_SITE_OTHER)

## 2024-11-30 ENCOUNTER — Ambulatory Visit (INDEPENDENT_AMBULATORY_CARE_PROVIDER_SITE_OTHER): Admitting: Nurse Practitioner

## 2024-11-30 VITALS — BP 149/75 | HR 73 | Resp 18 | Wt 187.2 lb

## 2024-11-30 DIAGNOSIS — I739 Peripheral vascular disease, unspecified: Secondary | ICD-10-CM | POA: Diagnosis not present

## 2024-11-30 DIAGNOSIS — Z9889 Other specified postprocedural states: Secondary | ICD-10-CM | POA: Diagnosis not present

## 2024-11-30 DIAGNOSIS — E7800 Pure hypercholesterolemia, unspecified: Secondary | ICD-10-CM | POA: Diagnosis not present

## 2024-11-30 DIAGNOSIS — I10 Essential (primary) hypertension: Secondary | ICD-10-CM

## 2024-11-30 LAB — VAS US ABI WITH/WO TBI
Left ABI: 0.86
Right ABI: 1.05

## 2024-11-30 NOTE — Progress Notes (Signed)
 Subjective:    Patient ID: Erin Hinton, female    DOB: 13-Mar-1946, 78 y.o.   MRN: [id number] Chief Complaint  Patient presents with   Follow-up    3 month abi and bilateral arterial follow up    HPI  Discussed the use of AI scribe software for clinical note transcription with the patient, who gave verbal consent to proceed.  History of Present Illness Erin Hinton is a 78 year old female with peripheral artery disease who presents for follow-up of her PAD.  Procedure: 08/11/2024 Procedure(s) Performed:             1.  Introduction catheter into left lower extremity 3rd order catheter placement               2.    Contrast injection left lower extremity for distal runoff             3.  Percutaneous transluminal angioplasty and stent placement mid left superficial femoral artery             4.  Percutaneous transluminal angioplasty left posterior tibial             5.  Star close closure right common femoral arteriotomy   07/14/2024 Procedure(s) Performed:             1.  Introduction catheter into right lower extremity 3rd order catheter placement               2.    Contrast injection right lower extremity for distal runoff             3.  Percutaneous transluminal angioplasty and stent placement right superficial femoral artery to 5 mm             4.  Percutaneous transluminal angioplasty right posterior tibial artery to 2.5 mm             5.  Percutaneous transluminal angioplasty and stent placement right external iliac artery to 7 mm.               6.  Star close closure left common femoral arteriotomy  Her legs are doing 'pretty good' but she lacks stamina. She tries to walk as much as she can and does not experience pain. However, if she stands for a long time, her legs start shaking. She has a history of lower back issues, which she thinks might contribute to her symptoms.  Her ABI on the right side was 1.05 and on the left was 1.04 on September 15th.  Currently, her right ABI remains at 1.05, but the left has decreased to 0.86. Despite this, she is still able to walk and perform daily activities such as grocery shopping and cleaning, although she takes her time with tasks like fixing the shower curtain.  She does note that she is not experiencing any significant claudication symptoms, much like she was experiencing prior to her angiograms.  She uses a blood thinner, which causes bruising. She engages in walking as part of her routine, including walking to the mailbox and around the grocery store to increase her steps. She also uses a small elliptical at home, although she feels it may not be as beneficial as walking. Her neighbors assist with tasks like taking out the garbage, especially after her procedure.  She is considering getting a pedicure and manicure but is cautious about the massage due to bruising.    Results DIAGNOSTIC ABI Right: 1.05 (08/31/2024) ABI  Left: 1.04 (08/31/2024) ABI Right: 1.05 (11/30/2024) ABI Left: 0.86 (11/30/2024) Lower Extremity Doppler Ultrasound: Stents patent, left stent narrowed, left tibial vessels monophasic   Review of Systems  Musculoskeletal:  Positive for back pain.  Neurological:  Positive for weakness.  All other systems reviewed and are negative.      Objective:   Physical Exam Vitals reviewed.  HENT:     Head: Normocephalic.  Cardiovascular:     Rate and Rhythm: Normal rate.     Pulses:          Dorsalis pedis pulses are detected w/ Doppler on the right side and detected w/ Doppler on the left side.       Posterior tibial pulses are detected w/ Doppler on the right side and detected w/ Doppler on the left side.  Pulmonary:     Effort: Pulmonary effort is normal.  Skin:    General: Skin is warm and dry.  Neurological:     Mental Status: She is alert and oriented to person, place, and time.  Psychiatric:        Mood and Affect: Mood normal.        Behavior: Behavior normal.         Thought Content: Thought content normal.        Judgment: Judgment normal.     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 ER positive: 51-90%   Depression    Heart murmur    in her 30's and it is not a problem now   Hyperlipidemia    Hyperlipidemia    Hypertension    Osteopenia    Personal history of radiation therapy    Shortness of breath dyspnea    due to excercise only    Social History   Socioeconomic History   Marital status: Widowed    Spouse name: Not on file   Number of children: Not on file   Years of education: Not on file   Highest education level: Some college, no degree  Occupational History   Not on file  Tobacco Use   Smoking status: Former    Current packs/day: 0.00    Average packs/day: 0.5 packs/day for 45.0 years (22.5 ttl pk-yrs)    Types: Cigarettes, E-cigarettes    Start date: 01/21/1971    Quit date: 01/22/2016    Years since quitting: 8.8    Passive exposure: Past   Smokeless tobacco: Never   Tobacco comments:    also uses e-cigaretts on occasion  Vaping Use   Vaping status: Some Days   Substances: Nicotine, Flavoring  Substance and Sexual Activity   Alcohol use: No   Drug use: No   Sexual activity: Not Currently    Birth control/protection: None  Other Topics Concern   Not on file  Social History Narrative   Not on file   Social Drivers of Health   Tobacco Use: Medium Risk (11/30/2024)   Patient History    Smoking Tobacco Use: Former  Smokeless Tobacco Use: Never    Passive Exposure: Past  Financial Resource Strain: Low Risk (02/22/2024)   Overall Financial Resource Strain  (CARDIA)    Difficulty of Paying Living Expenses: Not hard at all  Food Insecurity: No Food Insecurity (08/12/2024)   Epic    Worried About Programme Researcher, Broadcasting/film/video in the Last Year: Never true    Ran Out of Food in the Last Year: Never true  Transportation Needs: No Transportation Needs (08/12/2024)   Epic    Lack of Transportation (Medical): No    Lack of Transportation (Non-Medical): No  Physical Activity: Insufficiently Active (02/22/2024)   Exercise Vital Sign    Days of Exercise per Week: 2 days    Minutes of Exercise per Session: 20 min  Stress: Stress Concern Present (02/22/2024)   [employer] of Occupational Health - Occupational Stress Questionnaire    Feeling of Stress : To some extent  Social Connections: Moderately Isolated (08/12/2024)   Social Connection and Isolation Panel    Frequency of Communication with Friends and Family: More than three times a week    Frequency of Social Gatherings with Friends and Family: Twice a week    Attends Religious Services: More than 4 times per year    Active Member of [employer] or Organizations: No    Attends Banker Meetings: Never    Marital Status: Widowed  Intimate Partner Violence: Not At Risk (08/12/2024)   Epic    Fear of Current or Ex-Partner: No    Emotionally Abused: No    Physically Abused: No    Sexually Abused: No  Depression (PHQ2-9): Low Risk (02/10/2024)   Depression (PHQ2-9)    PHQ-2 Score: 0  Alcohol Screen: Low Risk (02/10/2024)   Alcohol Screen    Last Alcohol Screening Score (AUDIT): 0  Housing: Low Risk (08/12/2024)   Epic    Unable to Pay for Housing in the Last Year: No    Number of Times Moved in the Last Year: 0    Homeless in the Last Year: No  Utilities: Not At Risk (08/12/2024)   Epic    Threatened with loss of utilities: No  Health Literacy: Adequate Health Literacy (02/10/2024)   B1300 Health Literacy    Frequency of need for help with medical instructions: Never    Past Surgical History:   Procedure Laterality Date   BREAST BIOPSY Left 01/27/2019   Affirm Bx- Coil Clip- HIGH-GRADE DUCTAL CARCINOMA IN SITU, WITH COMEDONECROSIS.   BREAST EXCISIONAL BIOPSY Right 01/2002   neg. no lymph node involvement   BREAST EXCISIONAL BIOPSY Left 03/02/2019   lumpectomy DCIS   BREAST LUMPECTOMY Left 03/02/2019   left lumpectomy dcis with NL   BREAST LUMPECTOMY Left 03/02/2019   Procedure: BREAST WIDE EXCISION LEFT, WITH WIRE LOCATION;  Surgeon: Dessa Reyes ORN, MD;  Location: [facility];  Service: General;  Laterality: Left;   BREAST SURGERY  1998   breast biopsy DCIS   CATARACT EXTRACTION W/PHACO Right 05/01/2016   Procedure: CATARACT EXTRACTION PHACO AND INTRAOCULAR LENS PLACEMENT (IOC);  Surgeon: Elsie Carmine, MD;  Location: [facility];  Service: Ophthalmology;  Laterality: Right;  US  00:53 AP% 21.7 CDE 11.50 fluid pack lot # [id number]   CATARACT EXTRACTION W/PHACO Left 06/05/2016   Procedure: CATARACT EXTRACTION PHACO AND INTRAOCULAR LENS PLACEMENT (IOC);  Surgeon: Elsie Carmine, MD;  Location: [facility];  Service: Ophthalmology;  Laterality: Left;  US  01:04 AP% 22.2 CDE 14.34 fluid pack lot # [id number]   COLONOSCOPY  WITH PROPOFOL  N/A 11/21/2015   Procedure: COLONOSCOPY WITH PROPOFOL ;  Surgeon: Deward CINDERELLA Piedmont, MD;  Location: [facility];  Service: Gastroenterology;  Laterality: N/A;   EYE SURGERY     Cataract   LOWER EXTREMITY ANGIOGRAPHY Right 07/14/2024   Procedure: Lower Extremity Angiography;  Surgeon: Jama Cordella MATSU, MD;  Location: [facility];  Service: Cardiovascular;  Laterality: Right;   LOWER EXTREMITY ANGIOGRAPHY Left 08/11/2024   Procedure: Lower Extremity Angiography;  Surgeon: Jama Cordella MATSU, MD;  Location: [facility];  Service: Cardiovascular;  Laterality: Left;   LOWER EXTREMITY INTERVENTION Right 07/14/2024   Procedure: LOWER EXTREMITY INTERVENTION;  Surgeon: Jama Cordella MATSU, MD;  Location: [facility];  Service:  Cardiovascular;  Laterality: Right;   LOWER EXTREMITY INTERVENTION Left 08/11/2024   Procedure: LOWER EXTREMITY INTERVENTION;  Surgeon: Jama Cordella MATSU, MD;  Location: [facility];  Service: Cardiovascular;  Laterality: Left;   SKIN BIOPSY  01/01/2023   Upper Back    Family History  Problem Relation Age of Onset   Ovarian cancer Mother    Heart disease Sister    Lung cancer Sister    Lymphoma Father    Heart disease Father        MI   Hypertension Father    Cancer Father    Alcohol abuse Brother    Lung cancer Brother    Breast cancer Sister 25   Heart disease Sister    Heart attack Sister    Breast cancer Daughter 66       Charleen Ee, LCIS   Breast cancer Paternal Aunt 41   Colon cancer Neg Hx     Allergies[1]     Latest Ref Rng & Units 08/12/2024    4:36 AM 08/11/2024    9:32 PM 02/26/2024   11:21 AM  CBC  WBC 4.0 - 10.5 K/uL 6.8  13.0  6.1   Hemoglobin 12.0 - 15.0 g/dL 89.6  88.6  85.9   Hematocrit 36.0 - 46.0 % 31.1  34.0  41.9   Platelets 150 - 400 K/uL 185  198  232.0       CMP     Component Value Date/Time   NA 144 05/13/2024 1046   K 4.3 05/13/2024 1046   CL 103 05/13/2024 1046   CO2 24 05/13/2024 1046   GLUCOSE 99 05/13/2024 1046   GLUCOSE 98 02/26/2024 1121   BUN 16 08/11/2024 1401   BUN 15 05/13/2024 1046   CREATININE 0.89 08/11/2024 1401   CALCIUM  10.3 05/13/2024 1046   PROT 6.2 08/31/2024 1003   ALBUMIN 4.2 08/31/2024 1003   AST 27 08/31/2024 1003   ALT 30 08/31/2024 1003   ALKPHOS 63 08/31/2024 1003   BILITOT 0.5 08/31/2024 1003   GFR 76.57 02/26/2024 1121   EGFR 77 05/13/2024 1046   GFRNONAA >60 08/11/2024 1401     VAS US  ABI WITH/WO TBI Result Date: 11/30/2024  LOWER EXTREMITY DOPPLER STUDY Patient Name:  PATTRICIA WEIHER  Date of Exam:   11/30/2024 Medical Rec #: [id number]           Accession #:    [id number] Date of Birth: 1946/10/12           Patient Gender: F Patient Age:   25 years Exam Location:  [facility] Procedure:      VAS US  ABI WITH/WO TBI Referring Phys: GREGORY SCHNIER --------------------------------------------------------------------------------  Indications: Claudication, and peripheral artery disease. High Risk Factors:  Hypertension, hyperlipidemia, past history of smoking,                    coronary artery disease.  Vascular Interventions: 07/14/2024 right SFA and EIA stents, PTA of PTA.                          08/11/2024: PTA and Stent placement Mid Left SFA. PTA                         Left Posterior Tibial Artery. Performing Technologist: Donnice Charnley RVT  Examination Guidelines: A complete evaluation includes at minimum, Doppler waveform signals and systolic blood pressure reading at the level of bilateral brachial, anterior tibial, and posterior tibial arteries, when vessel segments are accessible. Bilateral testing is considered an integral part of a complete examination. Photoelectric Plethysmograph (PPG) waveforms and toe systolic pressure readings are included as required and additional duplex testing as needed. Limited examinations for reoccurring indications may be performed as noted.  ABI Findings: +---------+------------------+-----+---------+--------+ Right    Rt Pressure (mmHg)IndexWaveform Comment  +---------+------------------+-----+---------+--------+ Brachial 174                                      +---------+------------------+-----+---------+--------+ PTA      168               0.97 triphasic         +---------+------------------+-----+---------+--------+ DP       182               1.05 biphasic          +---------+------------------+-----+---------+--------+ Great Toe107               0.61                   +---------+------------------+-----+---------+--------+ +---------+------------------+-----+----------+-------+ Left     Lt Pressure (mmHg)IndexWaveform  Comment +---------+------------------+-----+----------+-------+ Brachial  174                                      +---------+------------------+-----+----------+-------+ PTA      143               0.82 monophasic        +---------+------------------+-----+----------+-------+ DP       149               0.86 monophasic        +---------+------------------+-----+----------+-------+ Great Toe72                0.41                   +---------+------------------+-----+----------+-------+ +-------+-----------+-----------+------------+------------+ ABI/TBIToday's ABIToday's TBIPrevious ABIPrevious TBI +-------+-----------+-----------+------------+------------+ Right  1.05       0.61       1.05        1.34         +-------+-----------+-----------+------------+------------+ Left   0.86       0.41       1.04        1.01         +-------+-----------+-----------+------------+------------+  Right ABIs appear essentially unchanged compared to prior study on 08/31/2024. Left ABIs appear decreased compared to prior study on 08/31/2024.  Summary: Right: Resting right ankle-brachial index is within normal range. The  right toe-brachial index is abnormal.  Left: Resting left ankle-brachial index indicates mild left lower extremity arterial disease. The left toe-brachial index is abnormal.  *See table(s) above for measurements and observations.  Electronically signed by [name] MD on 11/30/2024 at 4:27:22 PM.    Final        Assessment & Plan:   1. Peripheral arterial disease with history of revascularization (Primary) Peripheral arterial disease status post bilateral lower extremity revascularization with intermittent claudication ABI right 1.05, left 0.86. Patent stents, slight narrowing in left stent. Left tibial vessels monophasic. No intervention needed currently. - Continue walking regimen. - Monitor for pain, numbness, or sudden changes in sensation. - Continue blood thinner therapy. - Follow-up in six months, adjust based on symptoms.  -  VAS US  ABI WITH/WO TBI; Future  2. HYPERTENSION, BENIGN ESSENTIAL Continue antihypertensive medications as already ordered, these medications have been reviewed and there are no changes at this time.  3. Pure hypercholesterolemia Continue statin as ordered and reviewed, no changes at this time  Medications Ordered Prior to Encounter[2]  There are no Patient Instructions on file for this visit. Return in about 6 months (around 05/31/2025) for ABI see GS/FB.   Orvin FORBES Daring, NP      [1]  Allergies Allergen Reactions   Alendronate Sodium     GI pain   Atorvastatin     Muscle pain   Crestor  [Rosuvastatin  Calcium ]     Muscle pain   Evista [Raloxifene] Other (See Comments)    Breast tenderness   Rosuvastatin  Other (See Comments)   Sertraline Hcl     Did not help, made anxiety worse   Pseudoephedrine Palpitations  [2]  Current Outpatient Medications on File Prior to Visit  Medication Sig Dispense Refill   acetaminophen  (TYLENOL ) 500 MG tablet Take 500 mg by mouth every 6 (six) hours as needed (for pain.).     albuterol  (VENTOLIN  HFA) 108 (90 Base) MCG/ACT inhaler Inhale 2 puffs into the lungs every 4 (four) hours as needed for wheezing or shortness of breath. 18 g 3   Ascorbic Acid (VITAMIN C WITH ROSE HIPS) 1000 MG tablet Take 1,000 mg by mouth daily.     aspirin  EC 81 MG tablet Take 1 tablet (81 mg total) by mouth daily. Swallow whole.     Calcium  Carb-Cholecalciferol (CALCIUM  600 + D PO) Take 1 capsule by mouth daily.     Calcium -Magnesium-Zinc (CAL-MAG-ZINC PO) Take 1 tablet by mouth daily.     Cholecalciferol (VITAMIN D ) 50 MCG (2000 UT) tablet Take 2,000 Units by mouth daily.     clopidogrel  (PLAVIX ) 75 MG tablet Take 1 tablet (75 mg total) by mouth daily. 30 tablet 5   escitalopram  (LEXAPRO ) 20 MG tablet TAKE 1 TABLET BY MOUTH EVERY DAY 90 tablet 1   Evolocumab  (REPATHA  SURECLICK) 140 MG/ML SOAJ Inject 140 mg into the skin every 14 (fourteen) days. 6 mL 3   fluticasone   (FLONASE ) 50 MCG/ACT nasal spray Place 2 sprays into both nostrils daily as needed for allergies. 48 mL 1   hydrochlorothiazide  (HYDRODIURIL ) 25 MG tablet TAKE 1 TABLET BY MOUTH EVERY DAY 90 tablet 1   Omega-3 Fatty Acids (FISH OIL) 1000 MG CAPS Take 1,000 mg by mouth daily.     vitamin E 1000 UNIT capsule Take 1 capsule by mouth daily.     WIXELA INHUB 250-50 MCG/ACT AEPB INHALE 1 PUFF INTO THE LUNGS IN THE MORNING AND AT BEDTIME. 60 each 2   oxycodone  (OXY-IR)  5 MG capsule Take 1 capsule (5 mg total) by mouth every 6 (six) hours as needed. 20 capsule 0   No current facility-administered medications on file prior to visit.

## 2024-12-01 ENCOUNTER — Other Ambulatory Visit (HOSPITAL_COMMUNITY): Payer: Self-pay

## 2024-12-01 ENCOUNTER — Telehealth: Payer: Self-pay | Admitting: Pharmacy Technician

## 2024-12-01 NOTE — Telephone Encounter (Signed)
 Pharmacy Patient Advocate Encounter   Received notification from Patient Pharmacy that prior authorization for Repatha  is required/requested.   Insurance verification completed.   The patient is insured through [facility] ADVANTAGE/RX ADVANCE.   Per test claim: PA required; PA submitted to above mentioned insurance via Latent Key/confirmation #/EOC [id number] Status is pending

## 2024-12-01 NOTE — Telephone Encounter (Signed)
 Denial was on file per ins- done outside of [facility].. sent information as requested

## 2024-12-07 ENCOUNTER — Other Ambulatory Visit (HOSPITAL_COMMUNITY): Payer: Self-pay

## 2024-12-07 MED ORDER — REPATHA SURECLICK 140 MG/ML ~~LOC~~ SOAJ: 140.0000 mg | SUBCUTANEOUS | 3 refills | Status: AC

## 2024-12-07 NOTE — Addendum Note (Signed)
 Addended by: DARRELL BRUCKNER on: 12/07/2024 11:25 AM   Modules accepted: Orders

## 2024-12-07 NOTE — Telephone Encounter (Signed)
 Pharmacy Patient Advocate Encounter  Received notification from [facility] ADVANTAGE/RX ADVANCE that Prior Authorization for Repatha  has been APPROVED from 12/04/24 to 12/01/25. Ran test claim, Copay is $0.00- 3 months. This test claim was processed through [facility]- copay amounts may vary at other pharmacies due to pharmacy/plan contracts, or as the patient moves through the different stages of their insurance plan.   PA #/Case ID/Reference #: [id number]

## 2025-01-09 ENCOUNTER — Other Ambulatory Visit (INDEPENDENT_AMBULATORY_CARE_PROVIDER_SITE_OTHER): Payer: Self-pay | Admitting: Vascular Surgery

## 2025-01-21 ENCOUNTER — Other Ambulatory Visit: Payer: Self-pay | Admitting: Family Medicine

## 2025-01-21 DIAGNOSIS — Z1231 Encounter for screening mammogram for malignant neoplasm of breast: Secondary | ICD-10-CM

## 2025-02-10 ENCOUNTER — Ambulatory Visit: Payer: PPO

## 2025-03-02 ENCOUNTER — Encounter

## 2025-05-31 ENCOUNTER — Encounter (INDEPENDENT_AMBULATORY_CARE_PROVIDER_SITE_OTHER)

## 2025-05-31 ENCOUNTER — Ambulatory Visit (INDEPENDENT_AMBULATORY_CARE_PROVIDER_SITE_OTHER): Admitting: Nurse Practitioner
